# Patient Record
Sex: Male | Born: 1940 | Race: White | Hispanic: No | Marital: Married | Smoking: Former smoker
Health system: Southern US, Community
[De-identification: ages and names within clinical notes are randomized; demographics above are authoritative.]

## PROBLEM LIST (undated history)

## (undated) DIAGNOSIS — I10 Essential (primary) hypertension: Secondary | ICD-10-CM

## (undated) DIAGNOSIS — E785 Hyperlipidemia, unspecified: Secondary | ICD-10-CM

## (undated) DIAGNOSIS — D500 Iron deficiency anemia secondary to blood loss (chronic): Secondary | ICD-10-CM

## (undated) DIAGNOSIS — R972 Elevated prostate specific antigen [PSA]: Secondary | ICD-10-CM

## (undated) DIAGNOSIS — K635 Polyp of colon: Secondary | ICD-10-CM

## (undated) DIAGNOSIS — D72829 Elevated white blood cell count, unspecified: Secondary | ICD-10-CM

## (undated) DIAGNOSIS — D631 Anemia in chronic kidney disease: Secondary | ICD-10-CM

## (undated) DIAGNOSIS — D72823 Leukemoid reaction: Secondary | ICD-10-CM

## (undated) DIAGNOSIS — I6529 Occlusion and stenosis of unspecified carotid artery: Secondary | ICD-10-CM

## (undated) DIAGNOSIS — Z951 Presence of aortocoronary bypass graft: Secondary | ICD-10-CM

## (undated) DIAGNOSIS — R739 Hyperglycemia, unspecified: Secondary | ICD-10-CM

## (undated) DIAGNOSIS — N059 Unspecified nephritic syndrome with unspecified morphologic changes: Secondary | ICD-10-CM

## (undated) DIAGNOSIS — I739 Peripheral vascular disease, unspecified: Secondary | ICD-10-CM

## (undated) DIAGNOSIS — Z952 Presence of prosthetic heart valve: Secondary | ICD-10-CM

## (undated) HISTORY — DX: Anemia in chronic kidney disease: D63.1

## (undated) HISTORY — DX: Presence of aortocoronary bypass graft: Z95.1

## (undated) HISTORY — DX: Elevated prostate specific antigen (PSA): R97.20

## (undated) HISTORY — DX: Occlusion and stenosis of unspecified carotid artery: I65.29

## (undated) HISTORY — DX: Unspecified nephritic syndrome with unspecified morphologic changes: N05.9

## (undated) HISTORY — DX: Hyperglycemia, unspecified: R73.9

## (undated) HISTORY — DX: Elevated white blood cell count, unspecified: D72.829

## (undated) HISTORY — DX: Peripheral vascular disease, unspecified: I73.9

## (undated) HISTORY — DX: Leukemoid reaction: D72.823

## (undated) HISTORY — DX: Iron deficiency anemia secondary to blood loss (chronic): D50.0

## (undated) HISTORY — DX: Polyp of colon: K63.5

## (undated) HISTORY — DX: Hyperlipidemia, unspecified: E78.5

## (undated) HISTORY — DX: Essential (primary) hypertension: I10

---
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
## 2004-08-07 HISTORY — PX: BYPASS GRAFT: SHX909

## 2004-10-14 ENCOUNTER — Ambulatory Visit: Payer: Self-pay | Admitting: Gastroenterology

## 2004-10-24 ENCOUNTER — Ambulatory Visit: Payer: Self-pay | Admitting: Gastroenterology

## 2004-11-01 ENCOUNTER — Ambulatory Visit: Payer: Self-pay | Admitting: Internal Medicine

## 2004-11-08 ENCOUNTER — Ambulatory Visit: Payer: Self-pay

## 2004-11-14 ENCOUNTER — Ambulatory Visit (HOSPITAL_COMMUNITY): Admission: RE | Admit: 2004-11-14 | Discharge: 2004-11-14 | Payer: Self-pay | Admitting: Cardiovascular Disease

## 2004-11-23 ENCOUNTER — Ambulatory Visit
Admission: RE | Admit: 2004-11-23 | Discharge: 2004-11-23 | Payer: Self-pay | Admitting: Thoracic Surgery (Cardiothoracic Vascular Surgery)

## 2004-11-28 ENCOUNTER — Ambulatory Visit (HOSPITAL_COMMUNITY)
Admission: RE | Admit: 2004-11-28 | Discharge: 2004-11-28 | Payer: Self-pay | Admitting: Thoracic Surgery (Cardiothoracic Vascular Surgery)

## 2004-12-07 ENCOUNTER — Inpatient Hospital Stay (HOSPITAL_COMMUNITY)
Admission: RE | Admit: 2004-12-07 | Discharge: 2004-12-11 | Payer: Self-pay | Admitting: Thoracic Surgery (Cardiothoracic Vascular Surgery)

## 2004-12-07 DIAGNOSIS — Z951 Presence of aortocoronary bypass graft: Secondary | ICD-10-CM

## 2004-12-07 HISTORY — PX: CORONARY ARTERY BYPASS GRAFT: SHX141

## 2004-12-07 HISTORY — DX: Presence of aortocoronary bypass graft: Z95.1

## 2004-12-26 ENCOUNTER — Encounter
Admission: RE | Admit: 2004-12-26 | Discharge: 2004-12-26 | Payer: Self-pay | Admitting: Thoracic Surgery (Cardiothoracic Vascular Surgery)

## 2005-01-04 ENCOUNTER — Ambulatory Visit: Payer: Self-pay | Admitting: Internal Medicine

## 2005-01-04 ENCOUNTER — Encounter (HOSPITAL_COMMUNITY): Admission: RE | Admit: 2005-01-04 | Discharge: 2005-04-04 | Payer: Self-pay | Admitting: Cardiology

## 2005-02-02 ENCOUNTER — Ambulatory Visit: Payer: Self-pay | Admitting: Internal Medicine

## 2005-02-14 ENCOUNTER — Ambulatory Visit: Payer: Self-pay | Admitting: Internal Medicine

## 2005-03-06 ENCOUNTER — Ambulatory Visit: Payer: Self-pay | Admitting: Internal Medicine

## 2005-04-04 ENCOUNTER — Ambulatory Visit: Payer: Self-pay | Admitting: Internal Medicine

## 2005-04-11 ENCOUNTER — Ambulatory Visit: Payer: Self-pay | Admitting: Internal Medicine

## 2005-08-18 ENCOUNTER — Ambulatory Visit: Payer: Self-pay | Admitting: Internal Medicine

## 2005-09-01 ENCOUNTER — Ambulatory Visit: Payer: Self-pay

## 2005-09-14 ENCOUNTER — Ambulatory Visit: Payer: Self-pay | Admitting: Internal Medicine

## 2005-10-11 ENCOUNTER — Ambulatory Visit: Payer: Self-pay | Admitting: Internal Medicine

## 2005-10-13 ENCOUNTER — Ambulatory Visit: Payer: Self-pay | Admitting: Internal Medicine

## 2005-10-24 ENCOUNTER — Ambulatory Visit: Payer: Self-pay | Admitting: Cardiology

## 2005-11-17 ENCOUNTER — Ambulatory Visit: Payer: Self-pay | Admitting: Internal Medicine

## 2005-12-19 ENCOUNTER — Ambulatory Visit: Payer: Self-pay | Admitting: Internal Medicine

## 2005-12-26 ENCOUNTER — Ambulatory Visit: Payer: Self-pay | Admitting: Internal Medicine

## 2006-02-21 ENCOUNTER — Ambulatory Visit: Payer: Self-pay | Admitting: Internal Medicine

## 2006-04-03 ENCOUNTER — Ambulatory Visit: Payer: Self-pay | Admitting: Internal Medicine

## 2006-04-06 ENCOUNTER — Ambulatory Visit: Payer: Self-pay | Admitting: Cardiology

## 2006-05-01 ENCOUNTER — Ambulatory Visit: Payer: Self-pay

## 2006-06-25 ENCOUNTER — Ambulatory Visit: Payer: Self-pay | Admitting: Internal Medicine

## 2006-06-25 LAB — CONVERTED CEMR LAB
ALT: 27 units/L (ref 0–40)
AST: 25 units/L (ref 0–37)
Albumin: 4.2 g/dL (ref 3.5–5.2)
Alkaline Phosphatase: 92 units/L (ref 39–117)
BUN: 20 mg/dL (ref 6–23)
Bilirubin, Direct: 0.1 mg/dL (ref 0.0–0.3)
CO2: 28 meq/L (ref 19–32)
Calcium: 9.6 mg/dL (ref 8.4–10.5)
Chloride: 105 meq/L (ref 96–112)
Chol/HDL Ratio, serum: 3.9
Cholesterol: 118 mg/dL (ref 0–200)
Creatinine, Ser: 1.1 mg/dL (ref 0.4–1.5)
GFR calc non Af Amer: 71 mL/min
Glomerular Filtration Rate, Af Am: 86 mL/min/{1.73_m2}
Glucose, Bld: 118 mg/dL — ABNORMAL HIGH (ref 70–99)
HDL: 30.2 mg/dL — ABNORMAL LOW (ref >39.0)
LDL Cholesterol: 69 mg/dL (ref 0–99)
Potassium: 4.5 meq/L (ref 3.5–5.1)
Sodium: 142 meq/L (ref 135–145)
Total Bilirubin: 0.8 mg/dL (ref 0.3–1.2)
Total Protein: 6.6 g/dL (ref 6.0–8.3)
Triglyceride fasting, serum: 93 mg/dL (ref 0–149)
VLDL: 19 mg/dL (ref 0–40)

## 2006-07-05 ENCOUNTER — Ambulatory Visit: Payer: Self-pay | Admitting: Internal Medicine

## 2006-11-06 ENCOUNTER — Ambulatory Visit: Payer: Self-pay

## 2006-12-27 ENCOUNTER — Ambulatory Visit: Payer: Self-pay | Admitting: Internal Medicine

## 2006-12-27 LAB — CONVERTED CEMR LAB
ALT: 23 units/L (ref 0–40)
AST: 21 units/L (ref 0–37)
Albumin: 4.2 g/dL (ref 3.5–5.2)
Alkaline Phosphatase: 80 units/L (ref 39–117)
BUN: 16 mg/dL (ref 6–23)
Bilirubin, Direct: 0.1 mg/dL (ref 0.0–0.3)
CO2: 31 meq/L (ref 19–32)
Calcium: 9.8 mg/dL (ref 8.4–10.5)
Chloride: 109 meq/L (ref 96–112)
Cholesterol: 120 mg/dL (ref 0–200)
Creatinine, Ser: 1.1 mg/dL (ref 0.4–1.5)
GFR calc Af Amer: 86 mL/min
GFR calc non Af Amer: 71 mL/min
Glucose, Bld: 118 mg/dL — ABNORMAL HIGH (ref 70–99)
HDL: 31.1 mg/dL — ABNORMAL LOW (ref >39.0)
Hgb A1c MFr Bld: 5.8 % (ref 4.6–6.0)
LDL Cholesterol: 60 mg/dL (ref 0–99)
Potassium: 4.9 meq/L (ref 3.5–5.1)
Sodium: 146 meq/L — ABNORMAL HIGH (ref 135–145)
Total Bilirubin: 0.9 mg/dL (ref 0.3–1.2)
Total CHOL/HDL Ratio: 3.9
Total Protein: 7.5 g/dL (ref 6.0–8.3)
Triglycerides: 144 mg/dL (ref 0–149)
VLDL: 29 mg/dL (ref 0–40)

## 2007-01-04 ENCOUNTER — Ambulatory Visit: Payer: Self-pay | Admitting: Internal Medicine

## 2007-02-05 DIAGNOSIS — I10 Essential (primary) hypertension: Secondary | ICD-10-CM

## 2007-03-29 ENCOUNTER — Ambulatory Visit: Payer: Self-pay | Admitting: Internal Medicine

## 2007-03-29 LAB — CONVERTED CEMR LAB
BUN: 20 mg/dL (ref 6–23)
CO2: 29 meq/L (ref 19–32)
Calcium: 10.2 mg/dL (ref 8.4–10.5)
Chloride: 102 meq/L (ref 96–112)
Creatinine, Ser: 1.1 mg/dL (ref 0.4–1.5)
GFR calc Af Amer: 86 mL/min
GFR calc non Af Amer: 71 mL/min
Glucose, Bld: 165 mg/dL — ABNORMAL HIGH (ref 70–99)
Potassium: 5.2 meq/L — ABNORMAL HIGH (ref 3.5–5.1)
Sodium: 140 meq/L (ref 135–145)

## 2007-04-10 ENCOUNTER — Ambulatory Visit: Payer: Self-pay | Admitting: Internal Medicine

## 2007-04-10 DIAGNOSIS — R739 Hyperglycemia, unspecified: Secondary | ICD-10-CM

## 2007-05-17 ENCOUNTER — Ambulatory Visit: Payer: Self-pay

## 2007-05-21 ENCOUNTER — Telehealth: Payer: Self-pay | Admitting: Internal Medicine

## 2007-05-31 ENCOUNTER — Ambulatory Visit: Payer: Self-pay | Admitting: Cardiovascular Disease

## 2007-07-17 ENCOUNTER — Ambulatory Visit: Payer: Self-pay | Admitting: Internal Medicine

## 2007-07-17 LAB — CONVERTED CEMR LAB
ALT: 23 units/L (ref 0–53)
AST: 21 units/L (ref 0–37)
Albumin: 4.3 g/dL (ref 3.5–5.2)
Alkaline Phosphatase: 86 units/L (ref 39–117)
BUN: 14 mg/dL (ref 6–23)
Bilirubin, Direct: 0.2 mg/dL (ref 0.0–0.3)
CO2: 30 meq/L (ref 19–32)
Calcium: 9.8 mg/dL (ref 8.4–10.5)
Chloride: 101 meq/L (ref 96–112)
Cholesterol: 121 mg/dL (ref 0–200)
Creatinine, Ser: 1.1 mg/dL (ref 0.4–1.5)
GFR calc Af Amer: 86 mL/min
GFR calc non Af Amer: 71 mL/min
Glucose, Bld: 119 mg/dL — ABNORMAL HIGH (ref 70–99)
HDL: 26.2 mg/dL — ABNORMAL LOW (ref >39.0)
Hgb A1c MFr Bld: 5.8 % (ref 4.6–6.0)
LDL Cholesterol: 66 mg/dL (ref 0–99)
Potassium: 5 meq/L (ref 3.5–5.1)
Sodium: 140 meq/L (ref 135–145)
Total Bilirubin: 0.9 mg/dL (ref 0.3–1.2)
Total CHOL/HDL Ratio: 4.6
Total Protein: 6.8 g/dL (ref 6.0–8.3)
Triglycerides: 146 mg/dL (ref 0–149)
VLDL: 29 mg/dL (ref 0–40)

## 2007-07-24 ENCOUNTER — Ambulatory Visit: Payer: Self-pay | Admitting: Internal Medicine

## 2007-07-29 ENCOUNTER — Telehealth: Payer: Self-pay | Admitting: Internal Medicine

## 2007-09-12 ENCOUNTER — Telehealth: Payer: Self-pay | Admitting: Internal Medicine

## 2007-09-13 ENCOUNTER — Ambulatory Visit: Payer: Self-pay | Admitting: Internal Medicine

## 2007-11-18 ENCOUNTER — Ambulatory Visit: Payer: Self-pay | Admitting: Internal Medicine

## 2007-11-18 LAB — CONVERTED CEMR LAB
ALT: 24 units/L (ref 0–53)
Albumin: 4.3 g/dL (ref 3.5–5.2)
Bilirubin, Direct: 0.2 mg/dL (ref 0.0–0.3)
CO2: 30 meq/L (ref 19–32)
Calcium: 9.8 mg/dL (ref 8.4–10.5)
Chloride: 108 meq/L (ref 96–112)
Creatinine, Ser: 1.1 mg/dL (ref 0.4–1.5)
GFR calc Af Amer: 86 mL/min
LDL Cholesterol: 71 mg/dL (ref 0–99)
Sodium: 145 meq/L (ref 135–145)
Total Protein: 6.9 g/dL (ref 6.0–8.3)

## 2007-11-22 ENCOUNTER — Ambulatory Visit: Payer: Self-pay | Admitting: Internal Medicine

## 2007-11-22 DIAGNOSIS — E785 Hyperlipidemia, unspecified: Secondary | ICD-10-CM | POA: Insufficient documentation

## 2008-01-21 ENCOUNTER — Encounter: Payer: Self-pay | Admitting: Internal Medicine

## 2008-01-23 ENCOUNTER — Ambulatory Visit: Payer: Self-pay

## 2008-02-04 ENCOUNTER — Ambulatory Visit: Payer: Self-pay | Admitting: Internal Medicine

## 2008-03-26 ENCOUNTER — Ambulatory Visit: Payer: Self-pay | Admitting: Internal Medicine

## 2008-03-26 LAB — CONVERTED CEMR LAB
Albumin: 4.1 g/dL (ref 3.5–5.2)
Alkaline Phosphatase: 82 units/L (ref 39–117)
BUN: 21 mg/dL (ref 6–23)
CO2: 28 meq/L (ref 19–32)
Calcium: 9.5 mg/dL (ref 8.4–10.5)
Cholesterol: 102 mg/dL (ref 0–200)
Creatinine, Ser: 1.2 mg/dL (ref 0.4–1.5)
Glucose, Bld: 116 mg/dL — ABNORMAL HIGH (ref 70–99)
HDL: 23.1 mg/dL — ABNORMAL LOW (ref >39.0)
Hgb A1c MFr Bld: 6 % (ref 4.6–6.0)
Potassium: 4.6 meq/L (ref 3.5–5.1)
Sodium: 141 meq/L (ref 135–145)
Total Bilirubin: 0.8 mg/dL (ref 0.3–1.2)
Triglycerides: 83 mg/dL (ref 0–149)
VLDL: 17 mg/dL (ref 0–40)

## 2008-04-02 ENCOUNTER — Ambulatory Visit: Payer: Self-pay | Admitting: Internal Medicine

## 2008-05-14 ENCOUNTER — Ambulatory Visit: Payer: Self-pay | Admitting: Internal Medicine

## 2008-05-14 ENCOUNTER — Telehealth: Payer: Self-pay | Admitting: Internal Medicine

## 2008-06-09 ENCOUNTER — Ambulatory Visit: Payer: Self-pay | Admitting: Cardiovascular Disease

## 2008-07-16 ENCOUNTER — Ambulatory Visit: Payer: Self-pay | Admitting: Internal Medicine

## 2008-07-16 LAB — CONVERTED CEMR LAB
ALT: 22 units/L (ref 0–53)
AST: 21 units/L (ref 0–37)
Alkaline Phosphatase: 70 units/L (ref 39–117)
Bilirubin, Direct: 0.1 mg/dL (ref 0.0–0.3)
Calcium: 9.9 mg/dL (ref 8.4–10.5)
Cholesterol: 96 mg/dL (ref 0–200)
Creatinine, Ser: 1.1 mg/dL (ref 0.4–1.5)
GFR calc Af Amer: 86 mL/min
GFR calc non Af Amer: 71 mL/min
LDL Cholesterol: 54 mg/dL (ref 0–99)
Sodium: 144 meq/L (ref 135–145)
Total Bilirubin: 0.8 mg/dL (ref 0.3–1.2)
VLDL: 16 mg/dL (ref 0–40)

## 2008-07-23 ENCOUNTER — Ambulatory Visit: Payer: Self-pay | Admitting: Internal Medicine

## 2008-07-29 ENCOUNTER — Ambulatory Visit: Payer: Self-pay

## 2008-09-01 ENCOUNTER — Encounter: Payer: Self-pay | Admitting: Internal Medicine

## 2008-10-19 ENCOUNTER — Encounter: Payer: Self-pay | Admitting: Internal Medicine

## 2008-11-16 ENCOUNTER — Ambulatory Visit: Payer: Self-pay | Admitting: Internal Medicine

## 2008-11-16 LAB — CONVERTED CEMR LAB
AST: 22 units/L (ref 0–37)
Albumin: 4.2 g/dL (ref 3.5–5.2)
Alkaline Phosphatase: 75 units/L (ref 39–117)
Bilirubin, Direct: 0.1 mg/dL (ref 0.0–0.3)
CO2: 31 meq/L (ref 19–32)
Calcium: 9.7 mg/dL (ref 8.4–10.5)
Chloride: 107 meq/L (ref 96–112)
Creatinine, Ser: 1.1 mg/dL (ref 0.4–1.5)
Glucose, Bld: 120 mg/dL — ABNORMAL HIGH (ref 70–99)
HDL: 32.1 mg/dL — ABNORMAL LOW (ref >39.00)
Hgb A1c MFr Bld: 6.1 % (ref 4.6–6.5)
LDL Cholesterol: 60 mg/dL (ref 0–99)
Total Bilirubin: 0.9 mg/dL (ref 0.3–1.2)
Total CHOL/HDL Ratio: 3
VLDL: 16.4 mg/dL (ref 0.0–40.0)

## 2008-11-23 ENCOUNTER — Ambulatory Visit: Payer: Self-pay | Admitting: Internal Medicine

## 2009-03-22 ENCOUNTER — Ambulatory Visit: Payer: Self-pay | Admitting: Internal Medicine

## 2009-03-22 LAB — CONVERTED CEMR LAB
AST: 27 units/L (ref 0–37)
Albumin: 4.2 g/dL (ref 3.5–5.2)
Alkaline Phosphatase: 75 units/L (ref 39–117)
Chloride: 106 meq/L (ref 96–112)
GFR calc non Af Amer: 79.04 mL/min (ref >60)
Glucose, Bld: 118 mg/dL — ABNORMAL HIGH (ref 70–99)
Hgb A1c MFr Bld: 5.7 % (ref 4.6–6.5)
LDL Cholesterol: 56 mg/dL (ref 0–99)
Potassium: 4.7 meq/L (ref 3.5–5.1)
Total Protein: 7.1 g/dL (ref 6.0–8.3)
VLDL: 19.6 mg/dL (ref 0.0–40.0)

## 2009-03-29 ENCOUNTER — Ambulatory Visit: Payer: Self-pay | Admitting: Internal Medicine

## 2009-04-16 ENCOUNTER — Encounter: Payer: Self-pay | Admitting: Internal Medicine

## 2009-04-19 ENCOUNTER — Ambulatory Visit: Payer: Self-pay | Admitting: Internal Medicine

## 2009-05-17 ENCOUNTER — Ambulatory Visit: Payer: Self-pay | Admitting: Internal Medicine

## 2009-07-26 DIAGNOSIS — I739 Peripheral vascular disease, unspecified: Secondary | ICD-10-CM

## 2009-07-26 DIAGNOSIS — I25810 Atherosclerosis of coronary artery bypass graft(s) without angina pectoris: Secondary | ICD-10-CM

## 2009-07-26 DIAGNOSIS — I6529 Occlusion and stenosis of unspecified carotid artery: Secondary | ICD-10-CM | POA: Insufficient documentation

## 2009-07-26 DIAGNOSIS — F17200 Nicotine dependence, unspecified, uncomplicated: Secondary | ICD-10-CM

## 2009-07-28 ENCOUNTER — Encounter: Payer: Self-pay | Admitting: Cardiovascular Disease

## 2009-07-29 ENCOUNTER — Ambulatory Visit: Payer: Self-pay

## 2009-07-29 ENCOUNTER — Encounter: Payer: Self-pay | Admitting: Cardiovascular Disease

## 2009-08-02 ENCOUNTER — Telehealth: Payer: Self-pay | Admitting: Internal Medicine

## 2009-08-09 ENCOUNTER — Ambulatory Visit: Payer: Self-pay | Admitting: Cardiovascular Disease

## 2009-09-10 ENCOUNTER — Ambulatory Visit: Payer: Self-pay | Admitting: Internal Medicine

## 2009-09-10 LAB — CONVERTED CEMR LAB
Albumin: 4.2 g/dL (ref 3.5–5.2)
BUN: 21 mg/dL (ref 6–23)
Bilirubin, Direct: 0.2 mg/dL (ref 0.0–0.3)
CO2: 31 meq/L (ref 19–32)
Creatinine, Ser: 1.2 mg/dL (ref 0.4–1.5)
Glucose, Bld: 122 mg/dL — ABNORMAL HIGH (ref 70–99)
HDL: 38.7 mg/dL — ABNORMAL LOW (ref >39.00)
LDL Cholesterol: 47 mg/dL (ref 0–99)
Potassium: 5 meq/L (ref 3.5–5.1)
Total CHOL/HDL Ratio: 3
VLDL: 16.8 mg/dL (ref 0.0–40.0)

## 2009-09-14 ENCOUNTER — Encounter (INDEPENDENT_AMBULATORY_CARE_PROVIDER_SITE_OTHER): Payer: Self-pay | Admitting: *Deleted

## 2009-09-17 ENCOUNTER — Ambulatory Visit: Payer: Self-pay | Admitting: Internal Medicine

## 2009-11-09 ENCOUNTER — Encounter (INDEPENDENT_AMBULATORY_CARE_PROVIDER_SITE_OTHER): Payer: Self-pay

## 2009-11-11 ENCOUNTER — Telehealth: Payer: Self-pay | Admitting: Internal Medicine

## 2009-11-11 ENCOUNTER — Ambulatory Visit: Payer: Self-pay | Admitting: Gastroenterology

## 2009-11-22 ENCOUNTER — Telehealth: Payer: Self-pay | Admitting: Internal Medicine

## 2009-11-24 ENCOUNTER — Ambulatory Visit: Payer: Self-pay | Admitting: Gastroenterology

## 2009-11-24 DIAGNOSIS — K635 Polyp of colon: Secondary | ICD-10-CM

## 2009-11-24 HISTORY — DX: Polyp of colon: K63.5

## 2009-11-24 LAB — HM COLONOSCOPY

## 2009-11-29 ENCOUNTER — Encounter: Payer: Self-pay | Admitting: Gastroenterology

## 2010-03-11 ENCOUNTER — Ambulatory Visit: Payer: Self-pay | Admitting: Internal Medicine

## 2010-03-11 LAB — CONVERTED CEMR LAB
BUN: 19 mg/dL (ref 6–23)
CO2: 31 meq/L (ref 19–32)
Calcium: 9.8 mg/dL (ref 8.4–10.5)
Cholesterol: 117 mg/dL (ref 0–200)
Creatinine, Ser: 1 mg/dL (ref 0.4–1.5)
HDL: 35.6 mg/dL — ABNORMAL LOW (ref >39.00)
Potassium: 5.1 meq/L (ref 3.5–5.1)
Sodium: 145 meq/L (ref 135–145)
Triglycerides: 96 mg/dL (ref 0.0–149.0)

## 2010-03-18 ENCOUNTER — Ambulatory Visit: Payer: Self-pay | Admitting: Internal Medicine

## 2010-05-10 ENCOUNTER — Telehealth: Payer: Self-pay | Admitting: Internal Medicine

## 2010-08-15 ENCOUNTER — Telehealth: Payer: Self-pay | Admitting: Internal Medicine

## 2010-08-16 ENCOUNTER — Encounter: Payer: Self-pay | Admitting: Cardiovascular Disease

## 2010-08-16 ENCOUNTER — Ambulatory Visit
Admission: RE | Admit: 2010-08-16 | Discharge: 2010-08-16 | Payer: Self-pay | Source: Home / Self Care | Attending: Cardiovascular Disease | Admitting: Cardiovascular Disease

## 2010-09-06 NOTE — Letter (Signed)
Summary: Patient Notice- Polyp Results  [facility]  [address] [place], [place] [address]   Phone: [phone]  Fax: [phone]        November 29, 2009 MRN: [id number]    Brett [name] [address] [place], [place]  [address]    Dear Mr. Harder,  I am pleased to inform you that the colon polyp(s) removed during your recent colonoscopy was (were) found to be benign (no cancer detected) upon pathologic examination.  I recommend you have a repeat colonoscopy examination in 5_ years to look for recurrent polyps, as having colon polyps increases your risk for having recurrent polyps or even colon cancer in the future.  Should you develop new or worsening symptoms of abdominal pain, bowel habit changes or bleeding from the rectum or bowels, please schedule an evaluation with either your primary care physician or with me.  Additional information/recommendations:  xx__ No further action with gastroenterology is needed at this time. Please      follow-up with your primary care physician for your other healthcare      needs.  __ Please call [phone] to schedule a return visit to review your      situation.  __ Please keep your follow-up visit as already scheduled.  __ Continue treatment plan as outlined the day of your exam.  Please call us if you are having persistent problems or have questions about your condition that have not been fully answered at this time.  Sincerely,  Mardella Layman MD [facility]  This letter has been electronically signed by your physician.  Appended Document: Patient Notice- Polyp Results letter mailed 4.26.11

## 2010-09-06 NOTE — Assessment & Plan Note (Signed)
Summary: 6 MNTH ROV//SLM   Vital Signs:  Patient profile:   70 year old male Weight:      172 pounds Temp:     98 degrees F Pulse rate:   76 / minute Resp:     12 per minute BP sitting:   146 / 78  (left arm)  Vitals Entered By: Gladis Riffle, RN (September 17, 2009 9:06 AM)  Serial Vital Signs/Assessments:  Time      Position  BP       Pulse  Resp  Temp     By                     135/70                         [name] MD  CC: 6 month rov, labs done Is Patient Diabetic? No   Primary Care Provider:  [name] MD  CC:  6 month rov and labs done.  History of Present Illness:  Follow-Up Visit      This is a 70 year old man who presents for Follow-up visit.  The patient denies chest pain and palpitations.  Since the last visit the patient notes no new problems or concerns.  The patient reports taking meds as prescribed.  When questioned about possible medication side effects, the patient notes none.   All other systems reviewed and were negative  reviewed CV note  Preventive Screening-Counseling & Management  Alcohol-Tobacco     Smoking Status: quit  Medications Prior to Update: 1)  Avapro 300 Mg Tabs (Irbesartan) .... Take 1 Tablet By Mouth Once A Day 2)  Lipitor 80 Mg Tabs (Atorvastatin Calcium) .... Take 1 Tablet By Mouth Once A Day 3)  Metoprolol Succinate 50 Mg Tb24 (Metoprolol Succinate) .... Take 1 Tablet By Mouth Once A Day 4)  Aspirin Ec 325 Mg Tbec (Aspirin) .... Once Daily 5)  Diasense Multivitamin   Tabs (Multiple Vitamins-Minerals) .... One Daily 6)  Hydrochlorothiazide 12.5 Mg  Tabs (Hydrochlorothiazide) .... One By Mouth Daily 7)  Vitamin C 500 Mg  Tabs (Ascorbic Acid) .... Once Daily 8)  Caltrate 600+d 600-400 Mg-Unit  Tabs (Calcium Carbonate-Vitamin D) .... Once Daily 9)  Niaspan 1000 Mg Cr-Tabs (Niacin (Antihyperlipidemic)) .... Take 1 Tablet By Mouth Once A Day 10)  Cilostazol 100 Mg  Tabs (Cilostazol) .... Take 1 Tablet By Mouth Two Times A Day 11)   Nitroglycerin 0.3 Mg Subl (Nitroglycerin) .... One Under Tongue As Needed Chest Pain 12)  Niacin Cr 500 Mg Cr-Caps (Niacin) .... 2 By Mouth Daily  Allergies (verified): No Known Drug Allergies  Physical Exam  General:  Well-developed,well-nourished,in no acute distress; alert,appropriate and cooperative throughout examination Head:  normocephalic and atraumatic.   Eyes:  pupils equal and pupils round.   Ears:  R ear normal and L ear normal.   Neck:  No deformities, masses, or tenderness noted. Chest Wall:  No deformities, masses, tenderness or gynecomastia noted. Lungs:  Normal respiratory effort, chest expands symmetrically. Lungs are clear to auscultation, no crackles or wheezes. Heart:  normal rate and regular rhythm.  1/6 sem Abdomen:  soft, non-tender, and normal bowel sounds.   Msk:  normal ROM and no joint tenderness.   Neurologic:  cranial nerves II-XII intact and gait normal.   Skin:  turgor normal and color normal.   Psych:  normally interactive and good eye contact.     Impression &  Recommendations:  Problem # 1:  HYPERTENSION (ICD-401.9) reasonable control---rechecked His updated medication list for this problem includes:    Avapro 300 Mg Tabs (Irbesartan) .Marland Kitchen... Take 1 tablet by mouth once a day    Metoprolol Succinate 50 Mg Tb24 (Metoprolol succinate) .Marland Kitchen... Take 1 tablet by mouth once a day    Hydrochlorothiazide 12.5 Mg Tabs (Hydrochlorothiazide) ..... One by mouth daily  BP today: 146/78 Prior BP: 123/63 (08/09/2009)  Labs Reviewed: K+: 5.0 (09/10/2009) Creat: : 1.2 (09/10/2009)   Chol: 102 (09/10/2009)   HDL: 38.70 (09/10/2009)   LDL: 47 (09/10/2009)   TG: 84.0 (09/10/2009)  Problem # 2:  HYPERLIPIDEMIA (ICD-272.4) well controlled continue current medications  His updated medication list for this problem includes:    Lipitor 80 Mg Tabs (Atorvastatin calcium) .Marland Kitchen... Take 1 tablet by mouth once a day    Niaspan 1000 Mg Cr-tabs (Niacin (antihyperlipidemic))  .Marland Kitchen... Take 1 tablet by mouth once a day    Niacin Cr 500 Mg Cr-caps (Niacin) .Marland Kitchen... 2 by mouth daily  Labs Reviewed: SGOT: 22 (09/10/2009)   SGPT: 24 (09/10/2009)   HDL:38.70 (09/10/2009), 31.80 (03/22/2009)  LDL:47 (09/10/2009), 56 (03/22/2009)  Chol:102 (09/10/2009), 107 (03/22/2009)  Trig:84.0 (09/10/2009), 98.0 (03/22/2009)  Problem # 3:  CAROTID STENOSIS (ICD-433.10) no sxs His updated medication list for this problem includes:    Aspirin Ec 325 Mg Tbec (Aspirin) ..... Once daily    Cilostazol 100 Mg Tabs (Cilostazol) .Marland Kitchen... Take 1 tablet by mouth two times a day  Carotid Duplex Scan: Impressions:  Stable bilateral carotid disease. 0-39% RICA stenosis. 60-79% LICA stenosis, low end of range.  f/u 1 year.   Charlton Haws, MD. (07/29/2008)  Complete Medication List: 1)  Avapro 300 Mg Tabs (Irbesartan) .... Take 1 tablet by mouth once a day 2)  Lipitor 80 Mg Tabs (Atorvastatin calcium) .... Take 1 tablet by mouth once a day 3)  Metoprolol Succinate 50 Mg Tb24 (Metoprolol succinate) .... Take 1 tablet by mouth once a day 4)  Aspirin Ec 325 Mg Tbec (Aspirin) .... Once daily 5)  Diasense Multivitamin Tabs (Multiple vitamins-minerals) .... One daily 6)  Hydrochlorothiazide 12.5 Mg Tabs (Hydrochlorothiazide) .... One by mouth daily 7)  Vitamin C 500 Mg Tabs (Ascorbic acid) .... Once daily 8)  Caltrate 600+d 600-400 Mg-unit Tabs (Calcium carbonate-vitamin d) .... Once daily 9)  Niaspan 1000 Mg Cr-tabs (Niacin (antihyperlipidemic)) .... Take 1 tablet by mouth once a day 10)  Cilostazol 100 Mg Tabs (Cilostazol) .... Take 1 tablet by mouth two times a day 11)  Nitroglycerin 0.3 Mg Subl (Nitroglycerin) .... One under tongue as needed chest pain 12)  Niacin Cr 500 Mg Cr-caps (Niacin) .... 2 by mouth daily  Patient Instructions: 1)  Please schedule a follow-up appointment in 6 months. 2)  lipids 272.4 3)  liver 995.2  4)  bmet 5)  A!C

## 2010-09-06 NOTE — Assessment & Plan Note (Signed)
Summary: YEARL/SL  Medications Added NIACIN CR 500 MG CR-CAPS (NIACIN) 2 by mouth daily      Allergies Added: NKDA  Visit Type:  Follow-up Primary Provider:  [name] MD  CC:  none.  History of Present Illness: This is a 70 year old gentleman with coronary and peripheral arterial disease, presenting today for followup evaluation. The patient underwent coronary bypass surgery in 2006. 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  The patient is doing well at present. He has responded well to cilostazol and denies exertional leg pain at present. He does yard maintenance, but otherwise is very active. He specifically denies chest pain, leg pain, dyspnea, orthopnea, edema, or PND.  Current Medications (verified): 1)  Avapro 300 Mg Tabs (Irbesartan) .... Take 1 Tablet By Mouth Once A Day 2)  Lipitor 80 Mg Tabs (Atorvastatin Calcium) .... Take 1 Tablet By Mouth Once A Day 3)  Metoprolol Succinate 50 Mg Tb24 (Metoprolol Succinate) .... Take 1 Tablet By Mouth Once A Day 4)  Aspirin Ec 325 Mg Tbec (Aspirin) .... Once Daily 5)  Diasense Multivitamin   Tabs (Multiple Vitamins-Minerals) .... One Daily 6)  Hydrochlorothiazide 12.5 Mg  Tabs (Hydrochlorothiazide) .... One By Mouth Daily 7)  Vitamin C 500 Mg  Tabs (Ascorbic Acid) .... Once Daily 8)  Caltrate 600+d 600-400 Mg-Unit  Tabs (Calcium Carbonate-Vitamin D) .... Once Daily 9)  Niaspan 1000 Mg Cr-Tabs (Niacin (Antihyperlipidemic)) .... Take 1 Tablet By Mouth Once A Day 10)  Cilostazol 100 Mg  Tabs (Cilostazol) .... Take 1 Tablet By Mouth Two Times A Day 11)  Nitroglycerin 0.3 Mg Subl (Nitroglycerin) .... One Under Tongue As Needed Chest Pain 12)  Niacin Cr 500 Mg Cr-Caps (Niacin) .... 2 By Mouth Daily  Allergies (verified): No Known Drug Allergies  Past History:  Past medical history reviewed for relevance to current acute and chronic problems.  Past Medical History: Reviewed history from  07/26/2009 and no changes required. 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) HYPERTENSION (ICD-401.9) HYPERLIPIDEMIA (ICD-272.4) PERIPHERAL VASCULAR DISEASE (ICD-443.9) CAROTID STENOSIS (ICD-433.10) UNSPECIFIED GENERAL MEDICAL EXAMINATION (ICD-V70.9) HYPERGLYCEMIA (ICD-790.29) TOBACCO ABUSE (ICD-305.1)  Review of Systems       Negative except as per HPI   Vital Signs:  Patient profile:   70 year old male Height:      66 inches Weight:      172 pounds BMI:     27.86 Pulse rate:   85 / minute Resp:     16 per minute BP sitting:   123 / 63  (right arm)  Vitals Entered By: Marrion Coy, CNA (August 09, 2009 10:24 AM)  Physical Exam  General:  Pt is alert and oriented, in no acute distress. HEENT: normal Neck: normal carotid upstrokes without bruits, JVP normal Lungs: CTA CV: RRR without murmur or gallop Abd: soft, NT, positive BS, no bruit, no organomegaly Ext: no clubbing, cyanosis, or edema. peripheral pulses 2+ and equal Skin: warm and dry without rash    EKG  Procedure date:  08/09/2009  Findings:      Normal sinus rhythm, within normal limits. Heart rate 85 beats per minute.  Impression & Recommendations:  Problem # 1:  CAD, ARTERY BYPASS GRAFT (ICD-414.04) The patient is stable without angina. We'll continue his current medical therapy. Recommend followup in one year. His updated medication list for this problem includes:    Metoprolol Succinate 50 Mg Tb24 (Metoprolol succinate) .Marland Kitchen... Take 1 tablet by mouth once a  day    Aspirin Ec 325 Mg Tbec (Aspirin) ..... Once daily    Cilostazol 100 Mg Tabs (Cilostazol) .Marland Kitchen... Take 1 tablet by mouth two times a day    Nitroglycerin 0.3 Mg Subl (Nitroglycerin) ..... One under tongue as needed chest pain  Orders: EKG w/ Interpretation (93000)  Problem # 2:  PERIPHERAL VASCULAR DISEASE (ICD-443.9) the patient's claudication symptoms have resolved with the use of cilostazol. Encouraged him to try to  increase his walking program.  Will check ABIs prior to his visit next year.  Problem # 3:  HYPERTENSION (ICD-401.9) stable. His updated medication list for this problem includes:    Avapro 300 Mg Tabs (Irbesartan) .Marland Kitchen... Take 1 tablet by mouth once a day    Metoprolol Succinate 50 Mg Tb24 (Metoprolol succinate) .Marland Kitchen... Take 1 tablet by mouth once a day    Aspirin Ec 325 Mg Tbec (Aspirin) ..... Once daily    Hydrochlorothiazide 12.5 Mg Tabs (Hydrochlorothiazide) ..... One by mouth daily  BP today: 123/63 Prior BP: 104/60 (03/29/2009)  Labs Reviewed: K+: 4.7 (03/22/2009) Creat: : 1.0 (03/22/2009)   Chol: 107 (03/22/2009)   HDL: 31.80 (03/22/2009)   LDL: 56 (03/22/2009)   TG: 98.0 (03/22/2009)  Patient Instructions: 1)  Your physician recommends that you schedule a follow-up appointment in: 12 months . you wil receive a reminder 2 months prior appointment date. 2)  Carotid dopplet and ABI's same day next year on 2012.

## 2010-09-06 NOTE — Letter (Signed)
Summary: Colonoscopy Letter  [facility]  [address] [place], [place] [address]   Phone: [phone]  Fax: [phone]      September 14, 2009 MRN: [id number]   Brett [name] [address] [place], [place]  [address]   Dear [name],   According to your medical record, it is time for you to schedule a Colonoscopy. The American Cancer Society recommends this procedure as a method to detect early colon cancer. Patients with a family history of colon cancer, or a personal history of colon polyps or inflammatory bowel disease are at increased risk.  This letter has beeen generated based on the recommendations made at the time of your procedure. If you feel that in your particular situation this may no longer apply, please contact our office.  Please call our office at [phone] to schedule this appointment or to update your records at your earliest convenience.  Thank you for cooperating with [place] to provide you with the very best care possible.   Sincerely,  Vania Rea. Jarold Motto, M.D.  [facility] [phone]

## 2010-09-06 NOTE — Progress Notes (Signed)
Summary: lipitor refill  Phone Note Call from Patient Call back at Home Phone [phone]   Caller: Patient Call For: [name] MD Summary of Call: lipitor 80 mg with 3 refills fax to [facility] Initial call taken by: Heron Sabins,  November 22, 2009 12:15 PM  Follow-up for Phone Call        see Rx.  Left message on home phone to notify pt. Follow-up by: Gladis Riffle, RN,  November 22, 2009 2:29 PM    Prescriptions: LIPITOR 80 MG TABS (ATORVASTATIN CALCIUM) Take 1 tablet by mouth once a day  #90 x 3   Entered by:   Gladis Riffle, RN   Authorized by:   [name] MD   Signed by:   Gladis Riffle, RN on 11/22/2009   Method used:   Faxed to ...       [facility] (mail[facility])       [address] [place], [place]  [address]       Ph: [phone]       Fax: [phone]   RxID:   [id number]

## 2010-09-06 NOTE — Miscellaneous (Signed)
Summary: Lec previsit  Clinical Lists Changes  Medications: Added new medication of MOVIPREP 100 GM  SOLR (PEG-KCL-NACL-NASULF-NA ASC-C) As per prep instructions. - Signed Rx of MOVIPREP 100 GM  SOLR (PEG-KCL-NACL-NASULF-NA ASC-C) As per prep instructions.;  #1 x 0;  Signed;  Entered by: Ulis Rias RN;  Authorized by: Mardella Layman MD [facility];  Method used: Electronically to [facility] [phone]*, [address], [place], [place]  [address], Ph: [phone] or [phone], Fax: [phone] Observations: Added new observation of NKA: T (11/11/2009 9:04)    Prescriptions: MOVIPREP 100 GM  SOLR (PEG-KCL-NACL-NASULF-NA ASC-C) As per prep instructions.  #1 x 0   Entered by:   Ulis Rias RN   Authorized by:   Mardella Layman MD [facility]   Signed by:   Ulis Rias RN on 11/11/2009   Method used:   Electronically to        [facility] [phone]* (retail)       [address]       [place], [place]  [address]       Ph: [phone] or [phone]       Fax: [phone]   RxID:   [id number]   Appended Document: Lec previsit Pt came in for a previsit today prior to his colonoscopy on 11/24/09 with Dr Jarold Motto. He is on Pletal daily and had a CABG-5 vessel in 2006 for a blockage. Will send messge to Dr Jarold Motto and his nurse regarding this medication and if he should stay on it.  Appended Document: Lec previsit ask cardiology MD  Appended Document: Lec previsit Called Dr Cato Mulligan office and left message for nurse regarding pt being on Pletal and is scheduled for a colonoscopy with Dr Jarold Motto on 11/24/09. We  need to know if the pt needs to stay on the Pletal or stop it prior to the colonscopy.They wiill have Dr Cato Mulligan nurse call us back.  Appended Document: Lec previsit Alvino Chapel from Dr. Cato Mulligan office called.  It is OK for pt to stop pletal 5 days prior to proc and restart it the day after.      Appended Document: Lec previsit Spoke with pt and informed him to stop his Pletal 5 days prior to  the colonoscopy per Dr Cato Mulligan office and to restart it the next day past the procedure. Pt states he understood and he will call back if he has further questions.

## 2010-09-06 NOTE — Assessment & Plan Note (Signed)
Summary: 6 month rov/njr   Vital Signs:  Patient profile:   70 year old male Weight:      167 pounds Temp:     98.0 degrees F oral Pulse rate:   66 / minute BP sitting:   120 / 80  (right arm) Cuff size:   regular  Vitals Entered By: Romualdo Bolk, CMA (AAMA) (March 18, 2010 7:55 AM) CC: Follow-up visit on labs   Primary Care Provider:  [name] MD  CC:  Follow-up visit on labs.  History of Present Illness:  Follow-Up Visit      This is a 70 year old man who presents for Follow-up visit.  The patient denies chest pain and palpitations.  Since the last visit the patient notes no new problems or concerns.  The patient reports taking meds as prescribed and monitoring blood sugars.  When questioned about possible medication side effects, the patient notes none.    All other systems reviewed and were negative   Preventive Screening-Counseling & Management  Alcohol-Tobacco     Smoking Status: quit  Current Problems (verified): 1)  Cad, Artery Bypass Graft  (ICD-414.04) 2)  Hypertension  (ICD-401.9) 3)  Hyperlipidemia  (ICD-272.4) 4)  Peripheral Vascular Disease  (ICD-443.9) 5)  Carotid Stenosis  (ICD-433.10) 6)  Unspecified General Medical Examination  (ICD-V70.9) 7)  Hyperglycemia  (ICD-790.29) 8)  Tobacco Abuse  (ICD-305.1)  Current Medications (verified): 1)  Avapro 300 Mg Tabs (Irbesartan) .... Take 1 Tablet By Mouth Once A Day 2)  Lipitor 80 Mg Tabs (Atorvastatin Calcium) .... Take 1 Tablet By Mouth Once A Day 3)  Metoprolol Succinate 50 Mg Tb24 (Metoprolol Succinate) .... Take 1 Tablet By Mouth Once A Day 4)  Aspirin Ec 325 Mg Tbec (Aspirin) .... Once Daily 5)  Diasense Multivitamin   Tabs (Multiple Vitamins-Minerals) .... One Daily 6)  Hydrochlorothiazide 12.5 Mg  Tabs (Hydrochlorothiazide) .... One By Mouth Daily 7)  Vitamin C 500 Mg  Tabs (Ascorbic Acid) .... Once Daily 8)  Caltrate 600+d 600-400 Mg-Unit  Tabs (Calcium Carbonate-Vitamin D) .... Once  Daily 9)  Niaspan 1000 Mg Cr-Tabs (Niacin (Antihyperlipidemic)) .... Take 1 Tablet By Mouth Once A Day 10)  Cilostazol 100 Mg  Tabs (Cilostazol) .... Take 1 Tablet By Mouth Two Times A Day 11)  Nitroglycerin 0.3 Mg Subl (Nitroglycerin) .... One Under Tongue As Needed Chest Pain 12)  Niacin Cr 500 Mg Cr-Caps (Niacin) .... 2 By Mouth Daily  Allergies (verified): No Known Drug Allergies  Past History:  Past Medical History: Last updated: 23-Aug-2009 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) HYPERTENSION (ICD-401.9) HYPERLIPIDEMIA (ICD-272.4) PERIPHERAL VASCULAR DISEASE (ICD-443.9) CAROTID STENOSIS (ICD-433.10) UNSPECIFIED GENERAL MEDICAL EXAMINATION (ICD-V70.9) HYPERGLYCEMIA (ICD-790.29) TOBACCO ABUSE (ICD-305.1)  Past Surgical History: Last updated: 08/23/09 Coronary artery bypass graft-2006 Colonoscopy-09/09/2003  Family History: Last updated: 08/23/09  The patient's father died of myocardial infarction at age  70. The patient's paternal grandfather also died of myocardial infarction at a young age.  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 He is married and lives with his wife with their 85 year old daughter. The patient continues to smoke one pack of cigarettes per day and has smoked for more than 40 years. At times he smoked quite heavily. The patient denies history of significant alcohol consumption.  Risk Factors: Smoking Status:  quit (03/18/2010)  Physical Exam  General:  alert and well-developed.   Head:  normocephalic and atraumatic.   Eyes:  pupils equal and pupils round.   Ears:  R ear normal and L ear normal.   Neck:  No deformities, masses, or tenderness noted. Chest Wall:  No deformities, masses, tenderness or gynecomastia noted. Lungs:  normal  respiratory effort and no intercostal retractions.   Heart:  normal rate and regular rhythm.   Abdomen:  soft and non-tender.   Skin:  turgor normal and color normal.   Psych:  normally interactive and good eye contact.     Impression & Recommendations:  Problem # 1:  HYPERTENSION (ICD-401.9) continue current medications  His updated medication list for this problem includes:    Avapro 300 Mg Tabs (Irbesartan) .Marland Kitchen... Take 1 tablet by mouth once a day    Metoprolol Succinate 50 Mg Tb24 (Metoprolol succinate) .Marland Kitchen... Take 1 tablet by mouth once a day    Hydrochlorothiazide 12.5 Mg Tabs (Hydrochlorothiazide) ..... One by mouth daily  BP today: 120/80 Prior BP: 146/78 (09/17/2009)  Labs Reviewed: K+: 5.1 (03/11/2010) Creat: : 1.0 (03/11/2010)   Chol: 117 (03/11/2010)   HDL: 35.60 (03/11/2010)   LDL: 62 (03/11/2010)   TG: 96.0 (03/11/2010)  Problem # 2:  HYPERLIPIDEMIA (ICD-272.4) controlled continue current medications  His updated medication list for this problem includes:    Lipitor 80 Mg Tabs (Atorvastatin calcium) .Marland Kitchen... Take 1 tablet by mouth once a day    Niaspan 1000 Mg Cr-tabs (Niacin (antihyperlipidemic)) .Marland Kitchen... Take 1 tablet by mouth once a day    Niacin Cr 500 Mg Cr-caps (Niacin) .Marland Kitchen... 2 by mouth daily  Labs Reviewed: SGOT: 28 (03/11/2010)   SGPT: 26 (03/11/2010)   HDL:35.60 (03/11/2010), 38.70 (09/10/2009)  LDL:62 (03/11/2010), 47 (09/10/2009)  Chol:117 (03/11/2010), 102 (09/10/2009)  Trig:96.0 (03/11/2010), 84.0 (09/10/2009)  Problem # 3:  CAD, ARTERY BYPASS GRAFT (ICD-414.04)  His updated medication list for this problem includes:    Avapro 300 Mg Tabs (Irbesartan) .Marland Kitchen... Take 1 tablet by mouth once a day    Metoprolol Succinate 50 Mg Tb24 (Metoprolol succinate) .Marland Kitchen... Take 1 tablet by mouth once a day    Aspirin Ec 325 Mg Tbec (Aspirin) ..... Once daily    Hydrochlorothiazide 12.5 Mg Tabs (Hydrochlorothiazide) ..... One by mouth daily    Cilostazol 100 Mg Tabs  (Cilostazol) .Marland Kitchen... Take 1 tablet by mouth two times a day    Nitroglycerin 0.3 Mg Subl (Nitroglycerin) ..... One under tongue as needed chest pain  Labs Reviewed: Chol: 117 (03/11/2010)   HDL: 35.60 (03/11/2010)   LDL: 62 (03/11/2010)   TG: 96.0 (03/11/2010)  Complete Medication List: 1)  Avapro 300 Mg Tabs (Irbesartan) .... Take 1 tablet by mouth once a day 2)  Lipitor 80 Mg Tabs (Atorvastatin calcium) .... Take 1 tablet by mouth once a day 3)  Metoprolol Succinate 50 Mg Tb24 (Metoprolol succinate) .... Take 1 tablet by mouth once a day 4)  Aspirin Ec 325 Mg Tbec (Aspirin) .... Once daily 5)  Diasense Multivitamin Tabs (Multiple vitamins-minerals) .... One daily 6)  Hydrochlorothiazide 12.5 Mg Tabs (Hydrochlorothiazide) .... One by mouth daily 7)  Vitamin C 500 Mg Tabs (Ascorbic acid) .... Once daily 8)  Caltrate 600+d 600-400 Mg-unit Tabs (Calcium carbonate-vitamin d) .... Once daily 9)  Niaspan 1000 Mg Cr-tabs (Niacin (antihyperlipidemic)) .... Take 1 tablet by mouth once a day 10)  Cilostazol 100 Mg Tabs (Cilostazol) .... Take 1 tablet by mouth two times a  day 11)  Nitroglycerin 0.3 Mg Subl (Nitroglycerin) .... One under tongue as needed chest pain 12)  Niacin Cr 500 Mg Cr-caps (Niacin) .... 2 by mouth daily

## 2010-09-06 NOTE — Progress Notes (Signed)
Summary: REFILL REQUEST ([facility])  Phone Note Refill Request Message from:  Patient on May 10, 2010 2:32 PM  Refills Requested: Medication #1:  HYDROCHLOROTHIAZIDE 12.5 MG  TABS one by mouth daily   Notes: [facility] [employer] [facility]) - 90 day supply.  Medication #2:  METOPROLOL SUCCINATE 50 MG TB24 Take 1 tablet by mouth once a day   Notes: [facility]) - 90 day supply.  Medication #3:  NIASPAN 1000 MG CR-TABS Take 1 tablet by mouth once a day   Notes: [facility]) - 90 day supply...Marland KitchenMarland KitchenLooks like this med (Niaspan) may have been sent in already.    Initial call taken by: Debbra Riding,  May 10, 2010 2:32 PM    Prescriptions: HYDROCHLOROTHIAZIDE 12.5 MG  TABS (HYDROCHLOROTHIAZIDE) one by mouth daily  #90 x 3   Entered by:   Sid Falcon LPN   Authorized by:   [name] MD   Signed by:   Sid Falcon LPN on 08/65/7846   Method used:   Faxed to ...       [facility] (mail[facility]order)       [address] [place], [place]  [address]       Ph: [phone]       Fax: [phone]   RxID:   [id number] METOPROLOL SUCCINATE 50 MG TB24 (METOPROLOL SUCCINATE) Take 1 tablet by mouth once a day  #90 x 3   Entered by:   Sid Falcon LPN   Authorized by:   [name] MD   Signed by:   Sid Falcon LPN on 33/29/5188   Method used:   Faxed to ...       [facility] (mail-order)       [address] [place], [place]  [address]       Ph: [phone]       Fax: [phone]   RxID:   [id number]

## 2010-09-06 NOTE — Letter (Signed)
Summary: [facility] Instructions  [facility]  [address] [place], [place] [address]   Phone: [phone]  Fax: [phone]       WEYMAN BOGDON    [birth year]    MRN: [id number]        Procedure Day Dorna Bloom:  Wednesday 11/24/2009     Arrival Time: 7:30 am      Procedure Time: 8:30 am     Location of Procedure:                    _x _  [facility] (4th Floor)                        PREPARATION FOR COLONOSCOPY WITH MOVIPREP   Starting 5 days prior to your procedure Friday 4/15 do not eat nuts, seeds, popcorn, corn, beans, peas,  salads, or any raw vegetables.  Do not take any fiber supplements (e.g. Metamucil, Citrucel, and Benefiber).  THE DAY BEFORE YOUR PROCEDURE         DATE: Tuesday 4/19  1.  Drink clear liquids the entire day-NO SOLID FOOD  2.  Do not drink anything colored red or purple.  Avoid juices with pulp.  No orange juice.  3.  Drink at least 64 oz. (8 glasses) of fluid/clear liquids during the day to prevent dehydration and help the prep work efficiently.  CLEAR LIQUIDS INCLUDE: Water Jello Ice Popsicles Tea (sugar ok, no milk/cream) Powdered fruit flavored drinks Coffee (sugar ok, no milk/cream) Gatorade Juice: apple, white grape, white cranberry  Lemonade Clear bullion, consomm, broth Carbonated beverages (any kind) Strained chicken noodle soup Hard Candy                             4.  In the morning, mix first dose of MoviPrep solution:    Empty 1 Pouch A and 1 Pouch B into the disposable container    Add lukewarm drinking water to the top line of the container. Mix to dissolve    Refrigerate (mixed solution should be used within 24 hrs)  5.  Begin drinking the prep at 5:00 p.m. The MoviPrep container is divided by 4 marks.   Every 15 minutes drink the solution down to the next mark (approximately 8 oz) until the full liter is complete.   6.  Follow completed prep with 16 oz of clear liquid of your choice (Nothing  red or purple).  Continue to drink clear liquids until bedtime.  7.  Before going to bed, mix second dose of MoviPrep solution:    Empty 1 Pouch A and 1 Pouch B into the disposable container    Add lukewarm drinking water to the top line of the container. Mix to dissolve    Refrigerate  THE DAY OF YOUR PROCEDURE      DATE: Wednesday 4/20  Beginning at 3:30 a.m. (5 hours before procedure):         1. Every 15 minutes, drink the solution down to the next mark (approx 8 oz) until the full liter is complete.  2. Follow completed prep with 16 oz. of clear liquid of your choice.    3. You may drink clear liquids until 6:30 am  (2 HOURS BEFORE PROCEDURE).   MEDICATION INSTRUCTIONS  Unless otherwise instructed, you should take regular prescription medications with a small sip of water   as early as possible the morning  of your procedure. .  Additional medication instructions: Do not take HCTZ am of procedure.         OTHER INSTRUCTIONS  You will need a responsible adult at least 70 years of age to accompany you and drive you home.   This person must remain in the waiting room during your procedure.  Wear loose fitting clothing that is easily removed.  Leave jewelry and other valuables at home.  However, you may wish to bring a book to read or  an iPod/MP3 player to listen to music as you wait for your procedure to start.  Remove all body piercing jewelry and leave at home.  Total time from sign-in until discharge is approximately 2-3 hours.  You should go home directly after your procedure and rest.  You can resume normal activities the  day after your procedure.  The day of your procedure you should not:   Drive   Make legal decisions   Operate machinery   Drink alcohol   Return to work  You will receive specific instructions about eating, activities and medications before you leave.    The above instructions have been reviewed and explained to me by   Ulis Rias RN  November 11, 2009 9:34 AM     I fully understand and can verbalize these instructions _____________________________ Date _________

## 2010-09-06 NOTE — Progress Notes (Signed)
Summary: stop blood thinner?  Phone Note Call from Patient   Caller: Brett [name]  GI Call For: [name] MD Summary of Call: Pt is having colonoscopy on November 24, 2009 and needs to stop Pletel.   Dr. Jarold Motto [phone] Ask Brett [name] or Brett [name] Initial call taken by: Lynann Beaver CMA,  November 11, 2009 1:29 PM  Follow-up for Phone Call        ok.  resume day after procedure Follow-up by: [name] MD,  November 15, 2009 3:37 PM  Additional Follow-up for Phone Call Additional follow up Details #1::        Brett [name] at GI notified. Additional Follow-up by: Gladis Riffle, RN,  November 15, 2009 3:53 PM

## 2010-09-06 NOTE — Procedures (Signed)
Summary: Colonoscopy  Patient: Brett [name] Note: All result statuses are Final unless otherwise noted.  Tests: (1) Colonoscopy (COL)   COL Colonoscopy           DONE     [facility]     [address]     [place], [place]  [address]           COLONOSCOPY PROCEDURE REPORT           PATIENT:  Brett [name]  MR#:  [id number]     BIRTHDATE:  [birth year], 68 yrs. old  GENDER:  male     ENDOSCOPIST:  Vania Rea. Jarold Motto, MD, [facility]     REF. BY:     PROCEDURE DATE:  11/24/2009     PROCEDURE:  Colonoscopy with biopsy and snare polypectomy     ASA CLASS:  Class II     INDICATIONS:  Routine Risk Screening, history of pre-cancerous     (adenomatous) colon polyps     MEDICATIONS:   Fentanyl 75 mcg IV, Versed 7 mg IV           DESCRIPTION OF PROCEDURE:   After the risks benefits and     alternatives of the procedure were thoroughly explained, informed     consent was obtained.  Digital rectal exam was performed and     revealed no abnormalities.   The LB [id number] endoscope     was introduced through the anus and advanced to the cecum, which     was identified by both the appendix and ileocecal valve, without     limitations.  The quality of the prep was excellent, using     MoviPrep.  The instrument was then slowly withdrawn as the colon     was fully examined.     <<PROCEDUREIMAGES>>           FINDINGS:  Moderate diverticulosis was found.  There were multiple     polyps identified and removed. in the rectum and sigmoid colon.     small flapt polyps cold biopsy and hot snare excised.MIXED POLYPS.     Retroflexed views in the rectum revealed not done.    The scope     was then withdrawn from the patient and the procedure completed.           COMPLICATIONS:  None     ENDOSCOPIC IMPRESSION:     1) Moderate diverticulosis     2) Polyps, multiple in the rectum and sigmoid colon     RECOMMENDATIONS:     1) Follow up colonoscopy in 5 years     2) Await pathology results   3) Continue current medications     REPEAT EXAM:  No           ______________________________     Vania Rea. Jarold Motto, MD, Brett [name]           CC:           n.     eSIGNED:   Vania Rea. Patterson at 11/24/2009 09:06 AM           Brett [name], [id number]  Note: An exclamation mark (!) indicates a result that was not dispersed into the flowsheet. Document Creation Date: 11/24/2009 9:07 AM _______________________________________________________________________  (1) Order result status: Final Collection or observation date-time: 11/24/2009 09:00 Requested date-time:  Receipt date-time:  Reported date-time:  Referring Physician:   Ordering Physician: Sheryn Bison [phone]) Specimen Source:  Source: Launa Grill Order Number: [id number]  Lab site:   Appended Document: Colonoscopy     Procedures Next Due Date:    Colonoscopy: 11/2014

## 2010-09-08 NOTE — Progress Notes (Signed)
Summary: refill  Phone Note Refill Request Call back at [phone] Message from:  Patient---live call  Refills Requested: Medication #1:  AVAPRO 300 MG TABS Take 1 tablet by mouth once a day [facility]  Initial call taken by: Warnell Forester,  August 15, 2010 9:52 AM  Follow-up for Phone Call        Rx faxed to pharmacy Follow-up by: Alfred Levins, CMA,  August 15, 2010 10:49 AM    Prescriptions: AVAPRO 300 MG TABS (IRBESARTAN) Take 1 tablet by mouth once a day  #90 x 3   Entered by:   Alfred Levins, CMA   Authorized by:   [name] MD   Signed by:   Alfred Levins, CMA on 08/15/2010   Method used:   Faxed to ...       [facility] ([facility]       [address] [place], [place]  [address]       Ph: [phone]       Fax: [phone]   RxID:   [id number]

## 2010-09-08 NOTE — Assessment & Plan Note (Signed)
Summary: F1Y      Allergies Added: NKDA  Visit Type:  1 year follow up Primary Provider:  [name] MD  CC:  No complaints.  History of Present Illness: This is a 70 year old gentleman with coronary and peripheral arterial disease, presenting today for followup evaluation. The patient underwent coronary bypass surgery in 2006. 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  He is doing very well at present. The patient denies chest pain, dyspnea, orthopnea, PND, edema, palpitations, lightheadedness, or syncope.  He has no leg pain with ambulation or exercise. No rest pain or ulceration.  Current Medications (verified): 1)  Avapro 300 Mg Tabs (Irbesartan) .... Take 1 Tablet By Mouth Once A Day 2)  Lipitor 80 Mg Tabs (Atorvastatin Calcium) .... Take 1 Tablet By Mouth Once A Day 3)  Metoprolol Succinate 50 Mg Tb24 (Metoprolol Succinate) .... Take 1 Tablet By Mouth Once A Day 4)  Aspirin Ec 325 Mg Tbec (Aspirin) .... Once Daily 5)  Diasense Multivitamin   Tabs (Multiple Vitamins-Minerals) .... One Daily 6)  Hydrochlorothiazide 12.5 Mg  Tabs (Hydrochlorothiazide) .... One By Mouth Daily 7)  Vitamin C 500 Mg  Tabs (Ascorbic Acid) .... Once Daily 8)  Caltrate 600+d 600-400 Mg-Unit  Tabs (Calcium Carbonate-Vitamin D) .... Once Daily 9)  Niaspan 1000 Mg Cr-Tabs (Niacin (Antihyperlipidemic)) .... Take 1 Tablet By Mouth Once A Day 10)  Cilostazol 100 Mg  Tabs (Cilostazol) .... Take 1 Tablet By Mouth Two Times A Day 11)  Nitroglycerin 0.3 Mg Subl (Nitroglycerin) .... One Under Tongue As Needed Chest Pain 12)  Niacin Cr 500 Mg Cr-Caps (Niacin) .... 2 By Mouth Daily  Allergies (verified): No Known Drug Allergies  Past History:  Past medical history reviewed for relevance to current acute and chronic problems.  Past Medical History: Reviewed history from 07/26/2009 and no changes required. 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) CAROTID STENOSIS (ICD-433.10) UNSPECIFIED GENERAL MEDICAL EXAMINATION (ICD-V70.9) HYPERGLYCEMIA (ICD-790.29) TOBACCO ABUSE (ICD-305.1)  Review of Systems       Negative except as per HPI   Vital Signs:  Patient profile:   70 year old male Height:      66 inches Weight:      162 pounds BMI:     26.24 Pulse rate:   97 / minute Pulse rhythm:   regular Resp:     18 per minute BP sitting:   104 / 54  (left arm) Cuff size:   large  Vitals Entered By: Vikki Ports (August 16, 2010 9:27 AM)  Serial Vital Signs/Assessments:  Time      Position  BP       Pulse  Resp  Temp     By           R Arm     100/54                         Vikki Ports   Physical Exam  General:  Pt is alert and oriented, in no acute distress. HEENT: normal Neck: normal carotid upstrokes with a left carotid bruit, JVP normal Lungs: CTA CV: RRR without murmur or gallop Abd: soft, NT, positive BS, no bruit, no organomegaly Ext: no clubbing, cyanosis, or edema. peripheral pulses 2+ and equal Skin: warm and dry without rash    EKG  Procedure date:  08/16/2010  Findings:  Sinus rhythm 97 bpm, within normal limits.  Impression & Recommendations:  Problem # 1:  CAD, ARTERY BYPASS GRAFT (ICD-414.04) Stable without angina. Continue current medical program. All risk factors are well-controlled except for ongoing tobacco. We discussed the importance of tobacco cessation at length, especially in the setting of coronary, carotid, and lower extremity arterial disease.  His updated medication list for this problem includes:    Metoprolol Succinate 50 Mg Tb24 (Metoprolol succinate) .Marland Kitchen... Take 1 tablet by mouth once a day    Aspirin Ec 325 Mg Tbec (Aspirin) ..... Once daily    Cilostazol 100 Mg Tabs (Cilostazol) .Marland Kitchen... Take 1 tablet by mouth two times a day    Nitroglycerin 0.3 Mg Subl  (Nitroglycerin) ..... One under tongue as needed chest pain  Orders: EKG w/ Interpretation ([address]) Carotid Duplex (Carotid Duplex) Arterial Duplex Lower Extremity (Arterial Duplex Low)  Problem # 2:  PERIPHERAL VASCULAR DISEASE (ICD-443.9) Stable without claudication on appropriate med Rx. Will repeat ABI's later this year at the time of his carotid duplex.  Problem # 3:  CAROTID STENOSIS (ICD-433.10) Moderate left ICA stenosis - due for followup duplex.  His updated medication list for this problem includes:    Aspirin Ec 325 Mg Tbec (Aspirin) ..... Once daily    Cilostazol 100 Mg Tabs (Cilostazol) .Marland Kitchen... Take 1 tablet by mouth two times a day  Orders: EKG w/ Interpretation ([address]) Carotid Duplex (Carotid Duplex) Arterial Duplex Lower Extremity (Arterial Duplex Low)  Patient Instructions: 1)  Your physician recommends that you continue on your current medications as directed. Please refer to the Current Medication list given to you today. 2)  Your physician wants you to follow-up in:  1 YEAR.  You will receive a reminder letter in the mail two months in advance. If you don't receive a letter, please call our office to schedule the follow-up appointment. 3)  Your physician has requested that you have an ankle brachial index (ABI). During this test an ultrasound and blood pressure cuff are used to evaluate the arteries that supply the arms and legs with blood. Allow thirty minutes for this exam. There are no restrictions or special instructions. 4)  Your physician has requested that you have a carotid duplex. This test is an ultrasound of the carotid arteries in your neck. It looks at blood flow through these arteries that supply the brain with blood. Allow one hour for this exam. There are no restrictions or special instructions.

## 2010-09-15 ENCOUNTER — Encounter: Payer: Self-pay | Admitting: Internal Medicine

## 2010-09-16 ENCOUNTER — Ambulatory Visit (INDEPENDENT_AMBULATORY_CARE_PROVIDER_SITE_OTHER): Payer: Medicare Other | Admitting: Internal Medicine

## 2010-09-16 ENCOUNTER — Encounter: Payer: Self-pay | Admitting: Internal Medicine

## 2010-09-16 DIAGNOSIS — I739 Peripheral vascular disease, unspecified: Secondary | ICD-10-CM

## 2010-09-16 DIAGNOSIS — E785 Hyperlipidemia, unspecified: Secondary | ICD-10-CM

## 2010-09-16 DIAGNOSIS — I10 Essential (primary) hypertension: Secondary | ICD-10-CM

## 2010-09-16 DIAGNOSIS — Z0000 Encounter for general adult medical examination without abnormal findings: Secondary | ICD-10-CM

## 2010-09-16 DIAGNOSIS — R7309 Other abnormal glucose: Secondary | ICD-10-CM

## 2010-09-16 DIAGNOSIS — I6529 Occlusion and stenosis of unspecified carotid artery: Secondary | ICD-10-CM

## 2010-09-16 DIAGNOSIS — I25810 Atherosclerosis of coronary artery bypass graft(s) without angina pectoris: Secondary | ICD-10-CM

## 2010-09-16 DIAGNOSIS — F17200 Nicotine dependence, unspecified, uncomplicated: Secondary | ICD-10-CM

## 2010-09-16 LAB — CBC WITH DIFFERENTIAL/PLATELET
Basophils Absolute: 0 10*3/uL (ref 0.0–0.1)
Basophils Relative: 0.3 % (ref 0.0–3.0)
Eosinophils Absolute: 0.1 10*3/uL (ref 0.0–0.7)
Hemoglobin: 15.7 g/dL (ref 13.0–17.0)
Lymphs Abs: 3 10*3/uL (ref 0.7–4.0)
MCHC: 35.6 g/dL (ref 30.0–36.0)
MCV: 99.6 fl (ref 78.0–100.0)
Monocytes Absolute: 0.7 10*3/uL (ref 0.1–1.0)
Neutro Abs: 9.6 10*3/uL — ABNORMAL HIGH (ref 1.4–7.7)
RBC: 4.43 Mil/uL (ref 4.22–5.81)
RDW: 13.7 % (ref 11.5–14.6)

## 2010-09-16 LAB — HEMOGLOBIN A1C: Hgb A1c MFr Bld: 5.6 % (ref 4.6–6.5)

## 2010-09-16 LAB — HEPATIC FUNCTION PANEL
AST: 24 U/L (ref 0–37)
Alkaline Phosphatase: 85 U/L (ref 39–117)
Bilirubin, Direct: 0.1 mg/dL (ref 0.0–0.3)
Total Bilirubin: 0.9 mg/dL (ref 0.3–1.2)

## 2010-09-16 LAB — BASIC METABOLIC PANEL
CO2: 28 mEq/L (ref 19–32)
Chloride: 104 mEq/L (ref 96–112)
Glucose, Bld: 105 mg/dL — ABNORMAL HIGH (ref 70–99)
Sodium: 142 mEq/L (ref 135–145)

## 2010-09-16 LAB — LIPID PANEL: Total CHOL/HDL Ratio: 3

## 2010-09-16 NOTE — Assessment & Plan Note (Signed)
Patient has no symptoms. Continue current medications. Check laboratory work today.

## 2010-09-16 NOTE — Assessment & Plan Note (Signed)
Check labs today. Previously well controlled.

## 2010-09-16 NOTE — Progress Notes (Addendum)
Subjective:    Patient ID: Brett [name], male    DOB: [birth year], 70 y.o.   MRN: [id number]  HPI  patient is here for Medicare wellness visit. In addition patient has known coronary artery disease, hyperlipidemia, and hypertension. Patient denies any chest pain, shortness breath or PND. He is tolerating his medications without difficulty. Patient also has a history of hyperglycemia.  Subjective:    Brett [name] is a 70 y.o. male who presents for a welcome to Medicare exam.   Cardiac risk factors: known CAD.  Depression Screen (Note: if answer to either of the following is "Yes", a more complete depression screening is indicated)  Q1: Over the past two weeks, have you felt down, depressed or hopeless? no Q2: Over the past two weeks, have you felt little interest or pleasure in doing things? no  Activities of Daily Living In your present state of health, do you have any difficulty performing the following activities?:  Preparing food and eating?: No Bathing yourself: No Getting dressed: No Using the toilet:No Moving around from place to place: No In the past year have you fallen or had a near fall?:No  Current exercise habits: The patient does not participate in regular exercise at present.  Dietary issues discussed: no concerns  Hearing difficulties: No Safe in current home environment: yes Cognitive impairment: pt denies any concerns--pt has no motor or sensory concerns and has no cognitive concerns  The following portions of the patient's history were reviewed and updated as appropriate: allergies, current medications, past family history, past medical history, past social history, past surgical history and problem list. Review of Systems A comprehensive review of systems was negative.    Objective:      Blood pressure 134/76, pulse 114, temperature 98.3 F (36.8 C), temperature source Oral, height 5' 5.75" (1.67 m), weight 161 lb (73.029 kg). Body mass index is  26.18 kg/(m^2).   Physical Exam  Constitutional: He appears well-developed and well-nourished.  HENT:  Head: Normocephalic and atraumatic.  Right Ear: External ear normal.  Left Ear: External ear normal.  Nose: Nose normal.  Eyes: Right eye exhibits no discharge. No scleral icterus.  Neck: Neck supple. No thyromegaly present.  Cardiovascular: Normal rate, regular rhythm and normal heart sounds.   No murmur heard. Pulmonary/Chest: Effort normal and breath sounds normal. No respiratory distress.  Abdominal: Soft. Bowel sounds are normal. He exhibits no distension. There is no tenderness.  Genitourinary: Prostate normal.  Musculoskeletal: He exhibits no edema.  Lymphadenopathy:    He has no cervical adenopathy.  Neurological: He is alert. No cranial nerve deficit.  Skin: Skin is warm and dry.  Psychiatric: He has a normal mood and affect.    Assessment:         Plan:     During the course of the visit the patient was educated and counseled about appropriate screening and preventive services including:   Pneumococcal vaccine   Influenza vaccine  Td vaccine  Colorectal cancer screening  Diabetes screening  Smoking cessation counseling  Patient Instructions (the written plan) was given to the patient.   Review of Systems  Constitutional: Negative for diaphoresis, activity change and appetite change.  HENT: Negative for congestion, rhinorrhea and neck pain.   Eyes: Negative for photophobia and discharge.  Respiratory: Negative for choking.   Cardiovascular: Negative for chest pain.  Genitourinary: Negative for dysuria and flank pain.  Musculoskeletal: Negative for back pain and arthralgias.  Neurological: Negative for dizziness.  Hematological: Negative  for adenopathy.  Psychiatric/Behavioral: Negative for confusion and agitation.       Objective:   Physical Exam  Constitutional: He appears well-developed and well-nourished.  HENT:  Head: Normocephalic and  atraumatic.  Right Ear: External ear normal.  Left Ear: External ear normal.  Nose: Nose normal.  Eyes: Right eye exhibits no discharge. No scleral icterus.  Neck: Neck supple. No thyromegaly present.  Cardiovascular: Normal rate and regular rhythm.   Murmur (3/6 sem) heard. Pulses:      Popliteal pulses are 2+ on the right side, and 2+ on the left side.       Dorsalis pedis pulses are 0 on the right side, and 0 on the left side.       Posterior tibial pulses are 0 on the right side, and 0 on the left side.  Pulmonary/Chest: Effort normal and breath sounds normal. No respiratory distress.  Abdominal: Soft. Bowel sounds are normal. He exhibits no distension. There is no tenderness.  Genitourinary: Prostate normal.  Musculoskeletal: He exhibits no edema.  Lymphadenopathy:    He has no cervical adenopathy.  Neurological: He is alert. No cranial nerve deficit.  Skin: Skin is warm and dry.  Psychiatric: He has a normal mood and affect.          Assessment & Plan:

## 2010-09-16 NOTE — Assessment & Plan Note (Signed)
Adequate control. Continue current medications. 

## 2010-09-16 NOTE — Assessment & Plan Note (Signed)
Pulses are nonpalpable in the distal lower extremities. Patient has scheduled an arterial Doppler.

## 2010-09-16 NOTE — Assessment & Plan Note (Signed)
Discussed need for exercise, low carbohydrate diet. We'll check A1c.

## 2010-09-16 NOTE — Assessment & Plan Note (Signed)
Patient has a carotid ultrasound scheduled.

## 2010-09-23 ENCOUNTER — Encounter: Payer: Self-pay | Admitting: Cardiovascular Disease

## 2010-09-23 ENCOUNTER — Encounter (INDEPENDENT_AMBULATORY_CARE_PROVIDER_SITE_OTHER): Payer: Medicare Other

## 2010-09-23 DIAGNOSIS — I739 Peripheral vascular disease, unspecified: Secondary | ICD-10-CM

## 2010-09-23 DIAGNOSIS — I6529 Occlusion and stenosis of unspecified carotid artery: Secondary | ICD-10-CM

## 2010-10-24 ENCOUNTER — Telehealth: Payer: Self-pay | Admitting: Internal Medicine

## 2010-10-24 DIAGNOSIS — I6529 Occlusion and stenosis of unspecified carotid artery: Secondary | ICD-10-CM

## 2010-10-24 DIAGNOSIS — E785 Hyperlipidemia, unspecified: Secondary | ICD-10-CM

## 2010-10-24 MED ORDER — ATORVASTATIN CALCIUM 80 MG PO TABS: 80.0000 mg | ORAL_TABLET | Freq: Every day | ORAL | Status: DC

## 2010-10-24 MED ORDER — CILOSTAZOL 100 MG PO TABS: 100.0000 mg | ORAL_TABLET | Freq: Two times a day (BID) | ORAL | Status: DC

## 2010-10-24 NOTE — Telephone Encounter (Signed)
Sent in electronically .  

## 2010-10-24 NOTE — Telephone Encounter (Signed)
Pt is needing scripts for Lipitor 80 mg tab and Cilostavol 100 mg. 90 day supply on both to [facility].

## 2010-12-20 NOTE — Assessment & Plan Note (Signed)
[facility]                            CARDIOLOGY OFFICE NOTE   NAME:Brett [name]                      MRN:          [id number]  DATE:06/09/2008                            DOB:          11-Feb-1941    Brett [name] was seen in annual followup at the [facility] Cardiology  office on June 09, 2008.  He is a very nice 70 year old gentleman  with coronary and peripheral arterial disease.  He underwent coronary  bypass surgery in 2006.  He has a history of left calf claudication with  an ABI of 0.77 on the left.  He had his noninvasive study was suggestive  of left popliteal artery stenosis.  He has responded very well with  conservative management with the use of cilostazol.  He is moderately  active and has no symptoms with walking.  He specifically denies calf or  foot pain.  He has had no ulcerations or nonhealing wounds.  He denies  chest pain or chest pressure.  His lipids and blood pressure have been  managed by Dr. Cato Mulligan, and he has good control of his cardiac risk  factors.   CURRENT MEDICATIONS:  1. Aspirin 325 mg daily.  2. Multivitamin 1 daily.  3. Vitamin C 500 mg daily.  4. Calcium 600 mg daily.  5. Niacin 500 mg daily.  6. Avapro 300 mg daily.  7. Lipitor 80 mg daily.  8. Cilostazol 100 mg b.i.d.  9. Toprol XL 50 mg daily.  10.Hydrochlorothiazide 12.5 mg daily.   PHYSICAL EXAMINATION:  GENERAL:  The patient is alert and oriented.  He  is in no acute distress.  VITAL SIGNS:  Weight is 173 pounds, blood pressure 114/64, and heart  rate is 93.  HEENT:  Normal.  NECK:  Normal carotid upstrokes.  No bruits.  JVP normal.  LUNGS:  Clear bilaterally.  ABDOMEN:  Soft and nontender.  No organomegaly.  EXTREMITIES:  No clubbing, cyanosis, or edema.  SKIN:  Warm and dry without rash.   EKG shows normal sinus rhythm and is essentially within normal limits.   ASSESSMENT:  1. Coronary artery disease status post coronary artery bypass   grafting.  The patient is asymptomatic with no angina.  We will      continue his current medical program.  Followup office visit in 12      months.  No indication for stress testing at this point.  2. Dyslipidemia.  His LDL is at goal.  His HDL is very low in the 20s.      I have asked him to increase his niacin from 500 mg to 1000 mg      daily.  He was counseled about the possibility for flushing and      this can be alleviated with taking his aspirin 30 minutes prior.      He has followup lipids scheduled with Dr. Cato Mulligan in late December      2009, at the time of his followup office visit.  3. Hypertension.  Blood pressure control is excellent today.  Continue      Avapro, Toprol,  and hydrochlorothiazide.  4. Lower extremity peripheral arterial disease.  He is asymptomatic on      cilostazol  5. Carotid stenosis.  He has 60-79% left internal carotid stenosis on      the low end of that range.  He is due for followup carotid duplex      in December 2009.     Brett [name]. Brett Seltzer, MD  Electronically Signed    MDC/MedQ  DD: 06/09/2008  DT: 06/10/2008  Job #: [id number]   cc:   Brett Mole. Swords, MD

## 2010-12-20 NOTE — Progress Notes (Signed)
[facility]                        PERIPHERAL VASCULAR OFFICE NOTE   NAME:Brett [name]                      MRN:          [id number]  DATE:05/31/2007                            DOB:          1940/09/09    Brett [name] returns for followup at the [facility] Peripheral Vascular  Office on May 31, 2007.  Brett [name] is a 70 year old gentleman who  has been followed by Dr. Samule Ohm for coronary and peripheral arterial  disease.  He was initially seen because of left calf claudication.  His  noninvasive study showed an normal ABI on the right and an ABI of 0.77  on the left.  He was found to have elevated velocities in the left  popliteal artery suggestive of significant stenosis there.  Conservative  management was decided upon and he has actually been doing quite well.  He is not limited at all by leg pain at this point.  He has occasional  tightness in the left calf with exercise but is really not limited.  He  is able to mow his lawn and do other regular activities.  He does not  participate in regular aerobic activity or exercise.   Brett [name] has also had coronary bypass surgery in 2006.  He has never  been symptomatic but had an abnormal stress study ultimately leading to  a diagnosis of multi-vessel CAD and subsequently undergoing bypass.  He  denies chest pain, dyspnea, orthopnea, or PND.  He has had no  palpitations or edema.  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  He otherwise is not limited by any symptoms.   CURRENT MEDICATIONS:  1. Aspirin 325 mg daily.  2. Multivitamin daily.  3. Vitamin C 500 mg daily.  4. Calcium 600 mg daily.  5. Niacin 500 mg daily.  6. Avapro 300 mg daily.  7. Lipitor 80 mg daily.  8. Cilostazol 100 mg twice daily.  9. Metoprolol succinate 50 mg daily.   ALLERGIES:  NKDA.   PHYSICAL EXAMINATION:  GENERAL:  He is alert and oriented, no acute  distress.  VITAL SIGNS:  Blood pressure  146/60, heart rate 96, respiratory rate 16.  HEENT:  Normal.  NECK:  Normal carotid upstrokes with soft bilateral bruits.  Jugular  venous pressure is normal.  LUNGS:  Clear to auscultation bilaterally.  HEART:  Regular rate and rhythm without murmurs or gallops.  ABDOMEN:  Soft, nontender.  No organomegaly.  EXTREMITIES:  No clubbing, cyanosis, or edema.  Pedal pulses are 2 plus  on the right and 1 plus on the left.   EKG shows normal sinus rhythm and is within normal limits.   ASSESSMENT:  1. Coronary artery disease, status post coronary artery bypass graft.      He remains stable on his current medical regimen.  He remains on      antiplatelet therapy with aspirin, lipid-lowering therapy with a      combination of Lipitor and Niacin, as well as a beta-blocker.  He      is not having symptoms at present.  No indication  for further      evaluation at this point.  2. Dyslipidemia.  Lipids are followed by Dr. Cato Mulligan on dual therapy as      noted.  3. Peripheral arterial disease.  He has stable claudication involving      the left calf likely due to left popliteal artery stenosis.  His      symptoms are minimal.  Continue Cilostazol.  I encouraged increased      exercise.  4. Carotid arterial disease.  His carotid ultrasound from earlier this      month showed stable bilateral disease.  He has 40-59% internal      carotid artery stenosis on the right, and 60-79% left internal      carotid artery stenosis based on velocity measurements.  He will      have followup carotid ultrasound in 6 months.   I will see Brett [name] back in 1 year.  I would be happy to see him  sooner if he had any new problems.     Veverly Fells. Excell Seltzer, MD  Electronically Signed    MDC/MedQ  DD: 05/31/2007  DT: 05/31/2007  Job #: [id number]   cc:   Valetta Mole. Swords, MD

## 2010-12-23 NOTE — Discharge Summary (Signed)
NAME:  Brett [name], Brett [name] NO.:  [id number]   MEDICAL RECORD NO.:  [id number]          PATIENT TYPE:  INP   LOCATION:  2014                         FACILITY:  [facility]   PHYSICIAN:  Salvatore Decent. Cornelius Moras, M.D. DATE OF BIRTH:  1940-09-23   DATE OF ADMISSION:  12/07/2004  DATE OF DISCHARGE:                                 DISCHARGE SUMMARY   ADMISSION DIAGNOSIS:  Abnormal stress test.   DISCHARGE DIAGNOSIS:  1.  Hypertension.  2.  Hypercholesterolemia.  3.  Remote history of duodenal ulcer.  4.  Long standing tobacco abuse.  5.  Leukocytosis both pre and postoperatively.  6.  Coronary artery disease status post coronary artery bypass grafting x 5.   ALLERGIES:  No known drug allergies.   HISTORY OF PRESENT ILLNESS:  This is a 70 year old male who has a past  medical history significant for hypertension, hypercholesterolemia, and long-  standing tobacco abuse.  The patient recently underwent a colonoscopy and  was noted to have an irregular heart rhythm at the time.  EKG was performed  and this was abnormal.  The patient was subsequently referred from Dr.  Cato Mulligan for an elective Cardiolite exam.  This was performed on November 08, 2004.  The patient developed some shortness of breath and bilateral lower  extremity discomfort suggesting claudication, although he did not develop  any chest pain.  The patient developed some ST segment changes across the  inferior leads but stress was possible for the presence of reversible defect  involving the inferior wall suggestive of myocardial coronary ischemia.  Resting ejection fraction was 62%.  After this evaluation by Dr. Shelva Majestic,  the patient was referred to Dr. Nanetta Batty for elective cardiac cath  which was performed November 14, 2004, and revealed severe three vessel  coronary artery disease with a normal left ventricular  function.  The  patient was subsequently referred to Dr. Tressie Stalker of [facility] regarding  surgical  revascularization.  Dr. Cornelius Moras evaluated the patient and it was his  opinion that the patient should proceed with coronary artery bypass surgery.  Initial plans for surgery were postponed due to mild leukocytosis discovered  on routine preoperative bloodwork.  The patient also developed a rash  suggestive of a possible drug reaction.  All cultures and other tests were  unremarkable.   ALLERGIES:  No known drug allergies.   HOSPITAL COURSE:  The patient was admitted on Dec 07, 2004, and he was  clinically unchanged.  He did have a mild, diffuse, erythematous rash  present which he reported having been present for the last several weeks.  He did not have any fevers and his physical exam was benign.  The white  count remained mildly elevated at 14,400.  On Dec 07, 2004, the patient was  taken to the OR for coronary artery bypass grafting x 5.  Endoscopic graft  harvesting was performed on the right lower extremity.  The left internal  mammary artery was grafted to the LAD, saphenous vein was grafted to the PD,  saphenous vein was grafted to OM3 and in sequence to  OM4, and saphenous vein  was grafted to the D2.  The patient tolerated the procedure well and was  hemodynamically stable immediately postoperatively.  The patient was  transferred from the OR to the SICU in stable condition.  He was noted to  have some periorbital swelling early postoperatively, and this was treated  with intravenous steroids.  The patient was extubated without complications  and woke up from anesthesia neurologically intact.  He has been volume  overloaded postoperatively and has been diuresed accordingly.   On postoperative day one, he was doing quite well with no complaints.  The  patient was afebrile with normal sinus rhythm and stable vital signs.  The  patient's postoperative course has progressed as expected.  He is mildly  sore.  He is afebrile with stable vital signs and maintaining a normal sinus  rhythm.   On physical exam on postoperative day two, chest was clear to  auscultation, the sternal incision is clean and dry, the abdomen is soft,  and the rash has resolved.  Secondary to the patient's elevated white count  both preoperatively and postoperatively, he has been monitored closely.  The  patient was given a dose of IV steroids approximately 24 hours before and  the rash was resolved.  The postoperative elevated white count is thought to  be potentially exacerbated by the IV steroids with preexisting mild  leukocytosis.   LABORATORY DATA:  CBC on Dec 09, 2004, hemoglobin 11.7, hematocrit 32.6,  white count 21.3, platelets 291.  BMP on Dec 09, 2004, sodium 137, potassium  4.2, BUN 25, creatinine 1.1, glucose 150.   CONDITION ON DISCHARGE:  Stable.   DISCHARGE MEDICATIONS:  1.  Aspirin 325 mg daily.  2.  Atenolol 25 mg b.i.d.  3.  Avapro 150 mg q.a.m.  4.  Lipitor 10 mg daily.  5.  Lasix 40 mg daily x 7 days.  6.  K-Dur 20 mEq daily x 7 days.  7.  Wellbutrin 150 mg b.i.d.  8.  Lorazepam 150 mg b.i.d. p.r.n. anxiety.  9.  Tylox 1-2 p.o. q.4-6h. p.r.n. pain.   DISCHARGE INSTRUCTIONS:  Activities:  No driving and no lifting more than 10  pounds and the patient is to continue daily breathing and walking exercises.  Diet:  Low salt, low fat.  Wound care:  The patient may shower daily and  clean the incisions with soap and water.  If wound problems arise, he should  contact the [facility] office.  Follow up appointment with Dr. Allyson Sabal, call for his  appointment two weeks after discharge.  A chest x-ray will be taken at that  time which will be given to the patient for him to bring with him to his  follow up appointment with Dr. Cornelius Moras on Dec 26, 2004, at 3:30.      AY/MEDQ  D:  12/09/2004  T:  12/09/2004  Job:  [id number]

## 2010-12-23 NOTE — Cardiovascular Report (Signed)
NAMELAURENT, [name] NO.:  [id number]   MEDICAL RECORD NO.:  [id number]          PATIENT TYPE:  OIB   LOCATION:  2899                         FACILITY:  [facility]   PHYSICIAN:  Nanetta Batty, M.D.   DATE OF BIRTH:  [birth year]   DATE OF PROCEDURE:  DATE OF DISCHARGE:                              CARDIAC CATHETERIZATION   PROCEDURE DATE:  November 14, 2004.   HISTORY OF PRESENT ILLNESS:  Brett [name] is a 69 year old white married male  patient of Dr. Shelva Majestic and Dr. Cato Mulligan who comes to the same day area for a  cardiac catheterization.  He apparently most recently on October 19, 2004  underwent colonoscopy.  At that time, he was noted to have PACs.  His GI  physician, I believe Dr. Jarold Motto referred him back to his primary care  doctor concerned about his PACs and his EKG during the colonoscopy.  It was  then suggested that he go for a Cardiolite test.  This was performed and  apparently showed an abnormality and he had inferior wall ischemia.  The  patient then went to see Dr. Shelva Majestic for further evaluation who suggested  undergoing cardiac catheterization after reviewing the Cardiolite test.  Dr.  Cameron Ali contacted Dr. Allyson Sabal.  Thus, this cardiac cath has been set up.  Cardiac risk factors include positive tobacco, positive age, positive male,  positive hypertension, no diabetes, positive elevated cholesterol, positive  premature family history of heart disease.   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  He denies any  history of MI, CHF, CVA or diabetes.   MEDICATIONS:  Avapro 150 mg every day and multivitamin and zinc, glucose and  calcium.  He does not regularly take an aspirin every day.  He was given an  aspirin here in the same day area.   ALLERGIES:  No known drug allergies.   FAMILY HISTORY:  Mother died  at age 14 from old age.  Father died at age 11  from an MI.  Paternal grandfather died at age 65 from an MI.  He has no  siblings.   SOCIAL HISTORY:  He has been married for 18 years.  He has one daughter, age  78.  He smokes one pack of cigarettes per day since the age of 78.  He does  not drink any alcohol.  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  He  does not do any formal exercise but he is very active with work around [employer], gardening and maintenance.   REVIEW OF SYSTEMS:  He denies any recent cough, cold or virus.  No GERD.  No  abdominal pain.  No black, tarry stools.  No unilateral weakness.  No cramps  or claudication except when he was on the treadmill.  Positive knee-aching  at times.  No lower extremity edema.  He does have some fleeting  palpitations and fluttering on occasion.  No lower extremity edema.  No  presyncope.  No syncope.  Other systems are negative.   PHYSICAL EXAMINATION:  VITAL SIGNS:  Blood pressure is 181/80, heart rate is  84, temperature 97.2, O2 saturations are 96.  Height is 5 feet 7 inches.  Weight is 170 pounds.  EKG shows normal sinus rhythm 85.  GENERAL:  In no acute distress.  He is pleasant.  NECK:  No thyromegaly.  He has 2+ carotids without any bruits.  RESPIRATIONS:  Lungs are clear bilaterally with inspiratory effort.  CARDIAC:  Heart sounds are regular with a soft murmur.  ABDOMEN:  Bowel sounds are present x 4.  No mass or tenderness.  LOWER EXTREMITIES:  He has 2+ femorals, positive bilateral bruits.  He has  decreased pedal pulses, 2+ posterior tibial pulses.  No lower extremity  edema.   ASSESSMENT:  1.  Abnormal Cardiolite with showing inferior ischemia.  2.  Multiple cardiac risk factors.  3.  Hypertension, newly on Avapro 150 mg every day.  4.  Tobacco use for about 45 years.  5.  History of hyperlipidemia, mild.  6.  Positive premature family history for heart disease.  7.  Recent polypectomy on October 19, 2004.  8.   Bilateral femoral bruits with symptoms of leg fatigue and claudication      on the treadmill.  9.  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   PLAN:  Risks were explained, 1% for death, MI, stroke or need for urgent  bypass with cardiac catheterization and 2-3% of death, MI or stroke with PC  urgent bypass.  Procedure explained.  The patient willing to proceed.  Also  discuss hematoma, bruise and pseudoaneurysm as complications.  His wife and  mother-in-law were present at the time of this interview.  He was given an  aspirin in the same day area.  His labs for abnormals revealed elevated WBC  of 11.8.  His BUN and creatinine were normal as well as his potassium.  PT  and PTT were within normal limits.      BB/MEDQ  D:  11/14/2004  T:  11/14/2004  Job:  [id number]   cc:   Valetta Mole. Swords, M.D. [facility]

## 2010-12-23 NOTE — Op Note (Signed)
NAME:  Brett [name], Brett [name] NO.:  [id number]   MEDICAL RECORD NO.:  [id number]          PATIENT TYPE:  INP   LOCATION:  2310                         FACILITY:  [facility]   PHYSICIAN:  Salvatore Decent. Cornelius Moras, M.D. DATE OF BIRTH:  01-31-1941   DATE OF PROCEDURE:  12/07/2004  DATE OF DISCHARGE:                                 OPERATIVE REPORT   PREOPERATIVE DIAGNOSIS:  Severe three-vessel coronary artery disease.   POSTOPERATIVE DIAGNOSIS:  Severe three-vessel coronary artery disease.   PROCEDURE:  Median sternotomy for coronary artery bypass grafting times five  (left internal mammary artery to distal left anterior descending coronary  artery, saphenous vein graft to second diagonal branch, saphenous vein graft  to third circumflex marginal branch with sequential saphenous vein graft to  fourth circumflex marginal branch, saphenous vein graft to posterior  descending coronary artery, endoscopic saphenous vein harvest from right  thigh and right lower leg).   SURGEON:  Salvatore Decent. Cornelius Moras, M.D.   ASSISTANT:  Lujean Rave. P.A.   ANESTHESIA:  General.   BRIEF CLINICAL NOTE:  The patient is a 70 year old gentleman followed by Dr.  [name] with history of hypertension, hypercholesterolemia,  longstanding tobacco abuse, and a family history of coronary artery disease.  The patient was recently noted to have an abnormal electrocardiogram, and  this prompted a stress Cardiolite exam.  This was performed April4 and  demonstrated abnormal findings suspicious for coronary ischemia.  The  patient subsequently underwent elective cardiac catheterization by Dr.  Nanetta Batty on April10,2006.  Findings at the time of catheterization  include severe three-vessel coronary artery disease with normal left  ventricular function.  A full consultation note has been dictated  previously.   OPERATIVE CONSENT:  The patient and his wife have been counseled at length  regarding the  indications and potential benefits of coronary artery bypass  grafting.  Alternative treatment strategies have been discussed.  They  understand and accept all associated risks of surgery and desire to proceed  as described.   OPERATIVE FINDINGS:  1.  Normal left ventricular function.  2.  Moderate to severe atherosclerotic plaque involving the ascending      thoracic aorta.  3.  Diffuse coronary artery disease with relatively poor targets for bypass      grafting.  4.  Good-quality conduit for bypass grafting.   OPERATIVE NOTE IN DETAIL:  The patient was brought to the operating room on  the above-mentioned date and central monitoring was established by the  anesthesia service under the care and direction of Dr. Kipp Brood.  Specifically, a Swan-Ganz catheter is placed through the right internal  jugular approach.  A radial arterial line is placed.  Intravenous antibiotics are administered.  Following induction with general  endotracheal anesthesia, a Foley catheter is placed.  The patient's chest,  abdomen, both groins, and both lower extremities are prepared and draped in  a sterile manner.   Baseline transesophageal echocardiogram is performed by Dr. Noreene Larsson.  This  demonstrates normal left ventricular systolic function.  There is mild  aortic insufficiency.  There is trace mitral  regurgitation.  There is significant atherosclerotic plaque involving the proximal portion  of the ascending thoracic aorta.  No other abnormalities are noted.   A median sternotomy incision is performed and the left internal mammary  artery is dissected from the chest wall and prepared for bypass grafting.  The left internal mammary artery is a good-quality conduit.  Simultaneously  saphenous vein is obtained from the patient's right thigh and the upper  portion of the right lower leg using endoscopic vein harvest technique.  The  saphenous vein is slightly small-caliber but otherwise good-quality  conduit.  After the saphenous vein is removed, the small surgical incisions in the  right lower extremity are closed in multiple layers with running absorbable  suture.  The patient is heparinized systemically.   The pericardium is opened.  The ascending aorta is moderately diseased with  atherosclerosis.  An appropriate site for cannulation is chosen high in the  ascending aorta at the level of the innominate artery.  The aorta is  cannulated uneventfully.  A two-stage venous cannula is placed in the right  atrium.  Cardiopulmonary bypass is begun.  The surface of the heart is inspected.  There is diffuse coronary artery disease with diffuse palpable and visible  plaque in all of the epicardial coronary arteries.  Distal sites are  selected for coronary bypass grafting.  Portions of saphenous vein and the  left internal mammary artery are trimmed to appropriate lengths.  A  temperature probe is placed in the left ventricular septum.  A cardioplegia  catheter is placed in the ascending aorta.   The patient is allowed to cool passively to 32 degrees' systemic  temperature.  The aortic crossclamp  is applied and cardioplegia is delivered in an antegrade fashion through the  aortic root.  Iced saline slush is applied for topical hypothermia.  The  initial cardioplegic arrest and myocardial cooling are felt to be excellent.  Repeat doses of cardioplegia are administered intermittently throughout the  crossclamp portion of the operation through the aortic root and down the  subsequently-placed vein graft to maintain septal temperature below 15  degrees centigrade.   The following distal coronary anastomoses are performed:  1.  The posterior descending coronary artery is grafted with a saphenous      vein graft in an end-to-side fashion.  This vessel measures 1.3 mm at      the site of distal bypass.  It is diffusely diseased and a poor-quality     target.  2.  The third circumflex  marginal branch is grafted with a saphenous vein      graft in a side-to-side fashion.  This coronary measures 1.5 mm in diameter and is a fair-quality target.  It  is diffusely diseased, but a 1.5 probe will pass in both directions.  1.  The fourth circumflex marginal branch is grafted using a sequential      saphenous vein graft off of the vein placed to the third circumflex      marginal branch.  This coronary measures 1.5 mm in diameter and is of      fair quality.  It is also diffusely diseased, but a 1.5 probe will pass      in both directions.  2.  The second diagonal branch off the left anterior descending coronary      artery is grafted with a saphenous vein graft in an end-to-side fashion.      This coronary measures 1.4 mm in diameter and  is a fair to poor-quality      target.  It is diffusely diseased.  3.  The distal left anterior descending coronary artery is grafted with left      internal mammary artery in an end-to-side fashion.  This coronary      measures 1.8 mm in diameter and is a fair-quality target.  It is      diffusely diseased but a better-quality target and the other vessels.      and a 1.5 probe will pass in both direction easily.   All three proximal saphenous vein anastomoses are performed directly to the  ascending aorta prior to removal of the aortic crossclamp.  All air is  evacuated through the aortic root.  The left ventricular septal temperature  rises rapidly with reperfusion of the left internal mammary artery graft.  The aortic crossclamp is removed after a total crossclamp time of 77  minutes.   The heart begins to beat spontaneously without need for cardioversion.  All  proximal and distal coronary anastomoses are inspected for hemostasis and  appropriate graft orientation.  Epicardial pacing wires are affixed right  ventricular outflow tract and to the right atrial appendage.  The patient is  rewarmed to 37 degrees centigrade temperature.  The  patient is weaned from  cardiopulmonary bypass without difficulty.  The patient's rhythm at  separation from bypass is normal sinus rhythm.  No inotropic support is  required.  Total cardiopulmonary bypass time the operation is 94 minutes.   Follow-up transesophageal echocardiogram performed by Dr. Noreene Larsson after  separation from bypass demonstrates normal left ventricular function.  No  other abnormalities are noted.   The venous and arterial cannulae are removed uneventfully.  Protamine is  administered to reverse the anticoagulation.  The mediastinum and the left  chest are irrigated with saline solution containing vancomycin.  Meticulous  surgical hemostasis is ascertained.  The mediastinum and the left chest are  drained with three chest tubes exited through separate stab incisions  inferiorly.   The On-Q continuous pain management delivery system is utilized for postoperative analgesia.  Two 10-inch Silastic catheters supplied with On-Q  device are placed through separate stab incisions along the costal margin  and oriented in the deep subcutaneous tissues immediately anterior to the  costal cartilages just lateral to the lateral border of the sternum on  either side.  Each of these catheters are secured to the skin and flushed  with a 5 mL of 0.5% bupivacaine solution.  The catheters are then  subsequently connected to the continuous delivery pump.   The median sternotomy is closed in routine fashion with single-strength  sternal wire.  The soft tissues anterior to the sternum are closed in  multiple layers and the skin is closed with running subcuticular skin  closure.   The patient tolerated the procedure well and is transported to the surgical  intensive care unit in stable condition.  There are no intraoperative  complications.  All sponge, instrument and needle counts are verified  correct at completion of the operation. No blood products were administered.       CHO/MEDQ  D:  12/07/2004  T:  12/07/2004  Job:  [id number]   cc:   Macarthur Critchley. Shelva Majestic, M.D.  [address] [place]  [address]  [place]  [place] [address]  Fax: [phone]   Nanetta Batty, M.D.  Fax: [phone]   Valetta Mole. Swords, M.D. [facility]

## 2010-12-23 NOTE — Progress Notes (Signed)
[facility]                          PERIPHERAL VASCULAR OFFICE NOTE   NAME:FERRARAMase, Dhondt                      MRN:          [id number]  DATE:04/06/2006                            DOB:          [birth year]    PRIMARY CARE PHYSICIAN:  [name], M.D.   HISTORY OF PRESENT ILLNESS:  Mr. Bulthuis is a 70 year old gentleman with  atherosclerotic coronary and peripheral arterial disease.  He is status post  coronary artery bypass grafting in May 2006.  Ejection fraction is  preserved.  He also has lower extremity vascular disease.  He has several  years of left calf claudication.  It is currently bothering him at walking  about a 1/2 a mile of level ground.  He does not find it significantly  lifestyle limiting.  Based on duplex  performed in January of this year, it  appears that his symptomatic stenosis is of the proximal popliteal artery.  He has certainly had no rest pain or tissue loss.   CURRENT MEDICATIONS:  1. Enteric-coated aspirin 325 mg per day.  2. Multivitamin.  3. Vitamin C.  4. Calcium.  5. Niacin 500 mg per day.  6. Avapro 300 mg per day.  7. Lipitor 80 mg per day.  8. Pletal 100 mg twice per day.  9. Toprol XL 50 mg per day.   PHYSICAL EXAMINATION:  GENERAL:  He is generally well-appearing in no  distress.  VITAL SIGNS:  Heart rate 82.  Blood pressure 130/78 and equal bilaterally.  Weight is 181 pounds.  NECK:  He has no jugular venous distention, thyromegaly, or lymphadenopathy.  LUNGS:  Clear to auscultation.  Respiratory effort is normal.  CARDIAC:  He has a nondisplaced point of maximal cardiac impulse.  There is  a regular rate and rhythm.  There is a 2/6 systolic ejection murmur at the  right upper sternal border, which radiates to the neck.  ABDOMEN:  Soft, nondistended, nontender.  There is no hepatosplenomegaly and  no pulsatile midline mass.  No abdominal bruit.  EXTREMITIES:  Warm without clubbing, cyanosis,  edema, ulceration.  Carotid  pulses are 2+ bilaterally with bilateral bruits versus transmitted murmurs.  Femoral pulses are 1+ bilaterally without bruit.  Left popliteal, DP, and PT  are not palpable.  On the right, the popliteal is 1+ as are the DP and PT.   IMPRESSION/RECOMMENDATIONS:  1. Atherosclerotic coronary disease:  Asymptomatic.  No prior myocardial      infarction.  Ejection fraction preserved.  Continue aspirin and ARB as      secondary prevention.  2. Hypercholesterolemia:  Managed by Dr. Cato Mulligan.  Goal LDL less than 70,      HDL greater than 40.  3. Lower extremity peripheral arterial disease:  Minimally lifestyle-      limiting, left calf claudication.  Will continue conservative      management.  I have implored him to begin walking on a regular basis.      I think his wife will push him in this.  He is not sure that the Pletal      is  helping.  He will try stopping it and see if his symptoms remain      stable.   I will plan on seeing him back in a year.                                   Salvadore Farber, MD   WED/MedQ  DD:  04/06/2006  DT:  04/06/2006  Job #:  [id number]   cc:   Valetta Mole. Swords, MD

## 2010-12-23 NOTE — H&P (Signed)
NAME:  Brett [name], Brett [name] NO.:  [id number]   MEDICAL RECORD NO.:  [id number]          PATIENT TYPE:  INP   LOCATION:                               FACILITY:  [facility]   PHYSICIAN:  Salvatore Decent. Cornelius Moras, M.D. DATE OF BIRTH:  01/28/1941   DATE OF ADMISSION:  11/29/2004  DATE OF DISCHARGE:                                HISTORY & PHYSICAL   CHIEF COMPLAINT:  Abnormal stress test.   HISTORY OF PRESENT ILLNESS:  Brett [name] is a 70 year old retired male who  currently lives in [place], [place], who is followed by Dr. Cato Mulligan  with history of hypertension, mild hypercholesterolemia, longstanding  tobacco abuse, and a strong family history of coronary artery disease. Mr.  [name] apparently recently underwent a colonoscopy and was noted to have  irregular heart rhythm at that time. Electrocardiogram was performed and  apparently was abnormal. Subsequent to this, Dr. Cato Mulligan [name] Brett [name]  for an elective stress Cardiolite exam. This was performed on November 08, 2004.  The patient developed some shortness of breath and bilateral lower extremity  leg discomfort suggestive of claudication, although he did not develop any  chest pain during stress. He developed some equivocal ST-segment changes  across inferior leads, but stress imaging was notable for the presence of  reversible defect involving the inferior wall suggestive of myocardial  coronary ischemia. Resting ejection fraction was 62%. After evaluation by  Dr. Shelva [name], Brett [name] was subsequently [name] to Dr. Nanetta [name] for  elective cardiac catheterization. This was performed on November 14, 2004, and  findings at the time of catheterization are notable for the presence of  severe three-vessel coronary artery disease with normal left ventricular  function and coronary anatomy relatively unfavorable for percutaneous  coronary intervention. [name] has subsequently been [name] for  possible elective  surgical revascularization.   REVIEW OF SYSTEMS:  GENERAL: The patient reports feeling well overall. He  does note that he seems to get fatigued more easily than he used to, but  this has not been a sudden change but more slow and insidious. He reports  good appetite with no recent weight gain or weight loss. CARDIAC: The  patient specifically denies any substernal chest pain, chest tightness,  chest pressure either with activity or at rest. He has not had any  palpitations. He reports only moderate exertional shortness of breath and  this has not been problematic. He denies resting shortness of breath, PND,  orthopnea, or lower extremity edema. RESPIRATORY: Notable for morning cough  related to longstanding tobacco abuse. The patient claims he does not suffer  from recurrent bronchitis per se and he denies any recent history of  purulent sputum production or hemoptysis. GASTROINTESTINAL: Negative. The  patient has no difficulty swallowing. He denies ongoing problems with  reflux. He reports normal bowel function. He denies hematochezia,  hematemesis, melena. GENITOURINARY: Negative. The patient denies urinary  urgency or frequency. PERIPHERAL VASCULAR: Negative. The patient did develop  pain in both knees when he was walking on stress test but this does not  occur with routine physical activity at  all. NEUROLOGIC: Negative. The  patient denies recent transient visual disturbances. The patient denies  transient numbness or weakness involving either upper or lower extremity.  The patient denies history suggestive of seizures. PSYCHIATRIC: Negative.  HEENT: Negative.  The patient does wear eye glasses, but his vision is  stable.   PAST MEDICAL HISTORY:  1.  Hypertension.  2.  Borderline hypercholesterolemia.  3.  Remote history of duodenal ulcer.  4.  Longstanding tobacco abuse.   PAST SURGICAL HISTORY:  None.   FAMILY HISTORY:  The patient's father died of myocardial infarction at  age  46. The patient's paternal grandfather also died of myocardial infarction at  a young age.   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 He is married and  lives with his wife with their 47 year old daughter. The patient continues  to smoke one pack of cigarettes per day and has smoked for more than 40  years. At times he smoked quite heavily. The patient denies history of  significant alcohol consumption.   CURRENT MEDICATIONS:  1.  Avapro 150 mg daily.  2.  Toprol XL 25 mg daily.  3.  Wellbutrin SR 150 mg twice daily.  4.  Enteric-coated aspirin 81 mg daily.  5.  Lorazepam 1 mg twice daily as needed for anxiety.  All of these medications have been started within the last few weeks.   DRUG ALLERGIES:  None known.   PHYSICAL EXAM:  GENERAL APPEARANCE:  The patient is a well-appearing male  who appears his stated age in no acute distress.  VITAL SIGNS:  Blood pressure is slightly elevated at 180/80, pulse 88 and  regular. He is afebrile. Oxygen saturation 96% on room air.  HEENT: Exam is grossly unrevealing.  NECK:  Supple. There is no cervical or supraclavicular lymphadenopathy.  There is no jugular venous distension. No carotid bruits are noted.  CHEST:  Auscultation of the chest reveals clear breath sounds bilaterally.  No wheezes or rhonchi are noted. The patient does smell of cigarette smoke.  CARDIOVASCULAR: Exam includes regular rate and rhythm. No murmurs, rubs or  gallops noted.  ABDOMEN:  The abdomen is soft and nontender. There are no palpable masses.  Bowel sounds are present.  EXTREMITIES: Warm and well-perfused. There is no lower extremity edema.  Distal pulses are easily palpable in both lower legs at the ankle. There is  no sign of significant venous insufficiency. RECTAL AND GU:  Exams are both  deferred.  NEUROLOGIC:  Examination is grossly nonfocal symmetrical throughout.   DIAGNOSTIC TESTS:  Cardiac catheterization performed by Dr. Nanetta [name]  on April10, 2006, is reviewed. This demonstrates severe three-vessel  coronary artery disease with normal left ventricular function. Specifically,  there is 80% eccentric stenosis of the proximal left anterior descending  coronary artery arising at the takeoff of the large first septal perforating  branch. There is 70% proximal stenosis of a large first diagonal branch.  There is 80% proximal stenosis of the left circumflex coronary artery. There  is a very small first circumflex marginal branch. There is 80% proximal  stenosis of the large second circumflex marginal branch and 70-80% proximal  stenosis of the third circumflex marginal branch. There is 78% proximal  stenosis of the right coronary artery with 100% occlusion of the distal  right coronary artery after 90%  stenosis in the midportion. There is left-to-  right collateral filling of the posterior descending coronary artery. Left  ventricular function appears normal with ejection fraction estimated 55-60%.  There are no significant wall motion abnormalities.   IMPRESSION:  Severe three-vessel coronary artery disease with normal left  ventricular function. Mr. Cromartie remains essentially free of any symptoms,  but based upon his coronary anatomy, I believe he would benefit from  surgical revascularization in terms of decreased risk of future cardiac  event and prolonged long-term survival.   PLAN:  I have outlined options at length with Mr. Hartsell and his wife here  in the office today. Alternative treatment strategies have been discussed in  detail. They understand and accept all associated risks of surgery including  but not limited to risk of death, stroke, myocardial infarction, congestive  heart failure, respiratory failure, pneumonia, bleeding requiring blood   transfusion, arrhythmia, infection, and recurrent coronary artery disease.  He also understands the great importance associated with the fact that he  needs to find a way to quit smoking completely. All of his questions have  been addressed. We tentatively plan to proceed with surgery on Tuesday November 29, 2004.      CHO/MEDQ  D:  11/21/2004  T:  11/21/2004  Job:  [id number]   cc:   Macarthur Critchley. Brett [name], M.D.  [address] [place]  [address]  [place]  [place] [address]  Fax: [phone]   Brett [name], M.D.  Fax: [phone]   Valetta Mole. Swords, M.D. [facility]

## 2010-12-23 NOTE — Cardiovascular Report (Signed)
NAME:  ALICE, VITELLI NO.:  [id number]   MEDICAL RECORD NO.:  [id number]          PATIENT TYPE:  OIB   LOCATION:  2899                         FACILITY:  [facility]   PHYSICIAN:  Nanetta Batty, M.D.   DATE OF BIRTH:  1941-07-01   DATE OF PROCEDURE:  11/14/2004  DATE OF DISCHARGE:                              CARDIAC CATHETERIZATION   PROCEDURE:  Mr. Feltus is a 70 year old married white male, a patient of  Dr. Shirlee Latch with positive risk factors and a recent abnormal Cardiolite,  significant for inferior ischemia who was referred for a cardiac  catheterization to define his anatomy.   PROCEDURE:  Cardiac catheterization.   CARDIOLOGIST:  Nanetta Batty, M.D.   DESCRIPTION OF PROCEDURE:  The patient was brought to the second floor [facility]iac [facility]atheterization [facility] in the post-  absorptive state.  He was premedicated with p.o. Valium and IV Versed.  His  right groin was prepped and shaped in the usual sterile fashion.  Xylocaine  1% was used for local anesthesia.  A 6-French sheath was inserted into the  right femoral artery using the standard Seldinger technique.  The 6-French  right and left Judkins diagnostic catheters as well as a 6-French pigtail  catheter were used for selective coronary angiography, a left  ventriculography, sub-selective left internal mammary artery angiography and  distal abdominal aortography.  Visipaque dye was used for the entirety of  the case.  Retrograde aortic, left ventricular and pullback pressures were  recorded.   HEMODYNAMICS:  Aortic systolic pressure:  190.  Diastolic pressure:  78.  Left ventricular systolic pressure:  201.  Diastolic pressure:  15.   SELECTIVE CORONARY ANGIOGRAPHY:  1.  LEFT MAIN CORONARY ARTERY:  The left main coronary artery is normal.  2.  LEFT ANTERIOR DESCENDING CORONARY ARTERY:  The left anterior descending      coronary artery had a segmental 90% calcified lesion  proximally at the      takeoff of the first septal perforator which was a large septal as well      as decreased diagonal branch which was a medium-sized vessel.  There was      a 50%-60% segmental lesion in the mid-portion of the LAD.  The first      diagonal branch had 75%-80% segmental proximal stenosis.  3.  LEFT CIRCUMFLEX CORONARY ARTERY:  The left circumflex coronary artery      had 80% eccentric proximal, followed by 80% and 90% tandem lesions in      the distal OM.  The first OM branch had 80% and 90% proximal and mid-      stenosis.  4.  RIGHT CORONARY ARTERY:  The right coronary artery had a 75% segmental      proximal, a 90% segmental mid, followed by a total occlusion at the      genu.  There were grade 2 left to right collaterals.   LEFT VENTRICULOGRAPHY:  The RAO left ventriculogram was performed using 25  mL of Visipaque dye at 12 mL per second.  The overall LVF was estimated  at  greater than 60% without focal wall motion abnormalities.   LEFT INTERNAL MAMMARY ARTERY:  This vessel was sub-selectively visualized  and was widely patent.  It is suitable for use during coronary artery bypass  graft surgery.  There was incidentally noted insignificant 30% segmental  proximal left subclavian artery stenosis.   DISTAL ABDOMINAL AORTOGRAPHY:  The distal abdominal aortogram  was performed  using 20 mL of  Visipaque dye at 20 mL per second x2.  The renal arteries  are widely patent.  The infra-renal abdominal aorta is free of  atherosclerotic changes.  There were 40%-50% proximal/ostial common iliac  artery stenoses bilaterally; however, the patient does not have  claudication.   IMPRESSION:  Mr. Doristine Locks has three-vessel disease, preserved left  ventricular function and a positive Cardiolite stress test.  He does have  coronary calcification by fluoroscopy.  I believe that the best therapy  would be a coronary artery bypass grafting.   The sheaths were removed and pressure was  held on the groin to achieve  hemostasis.  The patient left the laboratory in stable condition.   DISPOSITION:  He will be discharged later today as an outpatient.  He will  follow up with  Kirke Shaggy, M.D., who was notified of these results and  will arrange for the patient to see a cardiothoracic surgeon some time this  week for a consultation.      JB/MEDQ  D:  11/14/2004  T:  11/14/2004  Job:  [id number]   cc:   Cardiac Cath Lab - [facility]   [facility]   Kirke Shaggy, M.D.  [place], [place]

## 2010-12-23 NOTE — H&P (Signed)
NAME:  Brett [name], Brett [name] NO.:  [id number]   MEDICAL RECORD NO.:  [id number]          PATIENT TYPE:  INP   LOCATION:  2310                         FACILITY:  [facility]   PHYSICIAN:  Guadalupe Maple, M.D.  DATE OF BIRTH:  02-23-1941   DATE OF ADMISSION:  12/07/2004  DATE OF DISCHARGE:                                HISTORY & PHYSICAL   PROCEDURE:  Intraoperative transesophageal echocardiography.   Brett [name] is a 70 year old white male with a history of three-vessel  coronary artery disease, who is scheduled to undergo coronary artery bypass  grafting by Dr.  Cornelius Moras.  Intraoperative transesophageal echocardiography was  requested to evaluate the left ventricular function and to determine if any  valvular pathology was present.   The patient was brought into the operating room at [facility] and  general anesthesia was induced without difficulty.  The trachea was  intubated without difficulty.  The transesophageal echocardiography probe  was then inserted into the esophagus without difficulty.   IMPRESSION:  Pre-bypass findings:  1.  Aortic valve:  The aortic leaflets were thickened, and there was      calcification in the base of the noncoronary cusp of the aortic valve,      but the valve appeared to open normally.  There was trace aortic      insufficiency present.  2.  Mitral valve:  The mitral valve leaflets were thickened but coapted      normally without prolapse or fluttering.  There was trace mitral      insufficiency.  3.  Left ventricle:  There was mild left ventricular hypertrophy.  The left      ventricular wall thickness measured 1.29 cm at end-diastole in the      midpapillary level of the anterior wall.  There was good contractility      at all segments interrogated.  Ejection fraction was estimated at 60%.      No wall motion abnormalities could be appreciated.  4.  Right ventricle:  The right ventricular function appeared normal.  There      was normal size of the right ventricle with good contractility of the      right ventricular free wall and good incursion of the lateral aspect of      the tricuspid annulus.  5.  Tricuspid valve:  The tricuspid leaflets were not well-visualized due to      poor image quality, but it appeared there was trace tricuspid      insufficiency present by color Doppler.  6.  Interatrial septum.  There was moderate lipomatous hypertrophy of the      interatrial septum, but the interatrial septum was intact without      evidence of patent foramen ovale or atrial septal defect.  7.  Left atrium:  Left atrial size appeared within normal limits.  There was      no thrombus noted in the left atrium or the left atrial appendage.  8.  Ascending aorta:  The ascending aorta appeared thickened diffusely, and      there was calcification  noted at the area of the sinotubular ridge      anteriorly.  9.  Descending aorta:  The descending aorta showed moderate atheromatous      disease and measured 2.62 cm in diameter.   Post-bypass findings:  1.  Left ventricle:  Left ventricular function appeared normal.  The      ejection fraction again was estimated at 60%.  There was good      contractility in all segments interrogated.  2.  Mitral valve:  There was trace mitral insufficiency, and the valve      appeared unchanged from the pre-bypass study.  3.  Aortic valve:  The aortic valve appeared unchanged from the pre-bypass      study.  There was trace aortic insufficiency.  4.  Right ventricle:  The right ventricular function appeared normal and      unchanged from the pre-bypass study.      DCJ/MEDQ  D:  12/07/2004  T:  12/07/2004  Job:  [id number]

## 2011-05-02 ENCOUNTER — Other Ambulatory Visit: Payer: Self-pay | Admitting: Internal Medicine

## 2011-08-15 ENCOUNTER — Encounter: Payer: Self-pay | Admitting: Cardiovascular Disease

## 2011-08-15 ENCOUNTER — Ambulatory Visit (INDEPENDENT_AMBULATORY_CARE_PROVIDER_SITE_OTHER): Payer: Medicare Other | Admitting: Cardiovascular Disease

## 2011-08-15 DIAGNOSIS — R011 Cardiac murmur, unspecified: Secondary | ICD-10-CM | POA: Diagnosis not present

## 2011-08-15 DIAGNOSIS — I6529 Occlusion and stenosis of unspecified carotid artery: Secondary | ICD-10-CM

## 2011-08-15 DIAGNOSIS — I739 Peripheral vascular disease, unspecified: Secondary | ICD-10-CM

## 2011-08-15 DIAGNOSIS — E785 Hyperlipidemia, unspecified: Secondary | ICD-10-CM

## 2011-08-15 DIAGNOSIS — I350 Nonrheumatic aortic (valve) stenosis: Secondary | ICD-10-CM | POA: Insufficient documentation

## 2011-08-15 DIAGNOSIS — I2510 Atherosclerotic heart disease of native coronary artery without angina pectoris: Secondary | ICD-10-CM

## 2011-08-15 DIAGNOSIS — I25810 Atherosclerosis of coronary artery bypass graft(s) without angina pectoris: Secondary | ICD-10-CM

## 2011-08-15 DIAGNOSIS — I10 Essential (primary) hypertension: Secondary | ICD-10-CM

## 2011-08-15 NOTE — Assessment & Plan Note (Signed)
Blood pressure is well-controlled on multidrug antihypertensive therapy.

## 2011-08-15 NOTE — Progress Notes (Signed)
HPI:  71 year old gentleman presenting for followup of coronary and peripheral arterial disease. The patient underwent multivessel coronary bypass surgery in 2006. 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 His last ABI study in February 2012 showed an ABI of 0.99 on the right and 0.79 on the left. The patient has carotid stenosis with 60-79% stenosis of the left internal carotid artery and less than 40% stenosis of the right internal carotid artery.  The patient is physically active but not engaged in regular exercise. He denies chest pain or pressure. He denies dyspnea, orthopnea, or PND. He has no palpitations. He denies leg pain with ambulation or at rest. He does complain of a flushing sensation in the evenings with associated itching. He has no other complaints today.  Outpatient Encounter Prescriptions as of 08/15/2011  Medication Sig Dispense Refill  . Ascorbic Acid (VITAMIN C) 500 MG tablet Take 500 mg by mouth daily.        Marland Kitchen aspirin 325 MG EC tablet Take 325 mg by mouth daily.        Marland Kitchen atorvastatin (LIPITOR) 80 MG tablet TAKE 1 TABLET DAILY  90 tablet  1  . Calcium Carbonate-Vitamin D (CALTRATE 600+D) 600-400 MG-UNIT per tablet Take 1 tablet by mouth daily.        . cilostazol (PLETAL) 100 MG tablet TAKE 1 TABLET TWICE A DAY  180 tablet  1  . hydrochlorothiazide (HYDRODIURIL) 12.5 MG tablet TAKE 1 TABLET DAILY  90 tablet  3  . HYDROCHLOROTHIAZIDE PO Take 12.5 mg by mouth daily.        . irbesartan (AVAPRO) 300 MG tablet TAKE 1 TABLET ONCE DAILY  90 tablet  3  . metoprolol (LOPRESSOR) 50 MG tablet Take 50 mg by mouth daily.        . metoprolol (TOPROL-XL) 50 MG 24 hr tablet TAKE 1 TABLET DAILY  90 tablet  3  . Multiple Vitamins-Minerals (DIASENSE MULTIVITAMIN) TABS Take 1 tablet by mouth.        . niacin 500 MG tablet Take 500 mg by mouth daily. Two by mouth daily       . NIASPAN 1000 MG CR tablet TAKE 1 TABLET ONCE DAILY  90  each  3  . nitroGLYCERIN (NITROSTAT) 0.3 MG SL tablet Place 0.3 mg under the tongue as needed. One under tongue as needed chest pain         No Known Allergies  Past Medical History  Diagnosis Date  . CAD (coronary artery disease) of artery bypass graft   . Hypertension   . Hyperlipidemia   . PVD (peripheral vascular disease)   . Carotid stenosis   . Hyperglycemia     ROS: Negative except as per HPI  BP 111/61  Pulse 85  Ht 5\' 6"  (1.676 m)  Wt 73.029 kg (161 lb)  BMI 25.99 kg/m2  PHYSICAL EXAM: Pt is alert and oriented, NAD HEENT: normal Neck: JVP - normal, carotids 2+= with bilateral bruits Lungs: CTA bilaterally CV: RRR with a grade 2/6 crescendo decrescendo murmur at the right upper sternal border Abd: soft, NT, Positive BS, no hepatomegaly Ext: no C/C/E Skin: warm/dry no rash  EKG:  Normal sinus rhythm 85 beats per minute, incomplete right bundle branch block  ASSESSMENT AND PLAN:

## 2011-08-15 NOTE — Assessment & Plan Note (Signed)
The patient is stable without anginal symptoms. He is on appropriate medical therapy with aspirin for antiplatelet therapy, atorvastatin for lipid lowering, and a beta blocker. He'll continue his current program without changes.

## 2011-08-15 NOTE — Patient Instructions (Signed)
Your physician wants you to follow-up in: 12 months.  You will receive a reminder letter in the mail two months in advance. If you don't receive a letter, please call our office to schedule the follow-up appointment.  Your physician has requested that you have a carotid duplex. This test is an ultrasound of the carotid arteries in your neck. It looks at blood flow through these arteries that supply the brain with blood. Allow one hour for this exam. There are no restrictions or special instructions.  Your physician has requested that you have an echocardiogram. Echocardiography is a painless test that uses sound waves to create images of your heart. It provides your doctor with information about the size and shape of your heart and how well your heart's chambers and valves are working. This procedure takes approximately one hour. There are no restrictions for this procedure.   

## 2011-08-15 NOTE — Assessment & Plan Note (Signed)
The patient has both short-acting niacin and long-acting Niaspan on his medication list. He is also on high-dose atorvastatin. His lipids were reviewed and he has low HDL cholesterol with good LDL. He is having flushing at nighttime and this is when he takes a short-acting niacin. I recommended that he discontinue this drug and continue on long-acting Niaspan along with his Lipitor.

## 2011-08-15 NOTE — Assessment & Plan Note (Signed)
The patient is stable without symptoms of claudication. His most recent ABIs were reviewed. We'll continue observation and medical therapy. He is on Pletal twice daily. We discussed the adverse effects of tobacco and he understands this. He has received counseling from both me and Dr. Cato Mulligan on numerous occasions.

## 2011-08-15 NOTE — Assessment & Plan Note (Signed)
The patient has a murmur suspicious for aortic stenosis. His aortic closure sound is intact and I suspect he may have mild aortic stenosis. I recommended an echocardiogram to further evaluate.

## 2011-08-15 NOTE — Assessment & Plan Note (Signed)
The patient has moderate, asymptomatic carotid stenosis. He will continue with regular carotid ultrasound surveillance. His last carotid duplex scan was reviewed as per the history of present illness.

## 2011-10-16 ENCOUNTER — Encounter (INDEPENDENT_AMBULATORY_CARE_PROVIDER_SITE_OTHER): Payer: Medicare Other

## 2011-10-16 ENCOUNTER — Encounter: Payer: Medicare Other | Admitting: *Deleted

## 2011-10-16 DIAGNOSIS — I6529 Occlusion and stenosis of unspecified carotid artery: Secondary | ICD-10-CM | POA: Diagnosis not present

## 2011-10-17 ENCOUNTER — Other Ambulatory Visit: Payer: Self-pay

## 2011-10-17 ENCOUNTER — Ambulatory Visit (HOSPITAL_COMMUNITY): Payer: Medicare Other | Attending: Cardiovascular Disease

## 2011-10-17 DIAGNOSIS — I379 Nonrheumatic pulmonary valve disorder, unspecified: Secondary | ICD-10-CM | POA: Insufficient documentation

## 2011-10-17 DIAGNOSIS — R011 Cardiac murmur, unspecified: Secondary | ICD-10-CM | POA: Insufficient documentation

## 2011-10-17 DIAGNOSIS — I080 Rheumatic disorders of both mitral and aortic valves: Secondary | ICD-10-CM | POA: Insufficient documentation

## 2011-10-17 DIAGNOSIS — I2510 Atherosclerotic heart disease of native coronary artery without angina pectoris: Secondary | ICD-10-CM | POA: Insufficient documentation

## 2011-10-17 DIAGNOSIS — I079 Rheumatic tricuspid valve disease, unspecified: Secondary | ICD-10-CM | POA: Diagnosis not present

## 2011-11-08 DIAGNOSIS — H2510 Age-related nuclear cataract, unspecified eye: Secondary | ICD-10-CM | POA: Diagnosis not present

## 2011-11-13 ENCOUNTER — Telehealth: Payer: Self-pay | Admitting: Internal Medicine

## 2011-11-13 MED ORDER — NITROGLYCERIN 0.3 MG SL SUBL: 0.3000 mg | SUBLINGUAL_TABLET | SUBLINGUAL | Status: DC | PRN

## 2011-11-13 MED ORDER — CILOSTAZOL 100 MG PO TABS: 100.0000 mg | ORAL_TABLET | Freq: Two times a day (BID) | ORAL | Status: DC

## 2011-11-13 NOTE — Telephone Encounter (Signed)
rx sent in electronically 

## 2011-11-13 NOTE — Telephone Encounter (Signed)
Pt is schedule to see Dr. Cato Mulligan 5/6 and is requesting a refill on cilostazol (PLETAL) 100 MG tablet  And nitroGLYCERIN (NITROSTAT) 0.3 MG SL tablet   [facility]

## 2011-11-27 ENCOUNTER — Other Ambulatory Visit: Payer: Self-pay | Admitting: Internal Medicine

## 2011-11-27 MED ORDER — ATORVASTATIN CALCIUM 80 MG PO TABS: 80.0000 mg | ORAL_TABLET | Freq: Every day | ORAL | Status: DC

## 2011-11-27 NOTE — Telephone Encounter (Signed)
Pt needs generic lipitor 80 mg#90 with 3 refills sent to [facility]. Pt has ov sch for 12-11-2011

## 2011-11-27 NOTE — Telephone Encounter (Signed)
3 mth supply sent in electronically 

## 2011-12-11 ENCOUNTER — Ambulatory Visit (INDEPENDENT_AMBULATORY_CARE_PROVIDER_SITE_OTHER): Payer: Medicare Other | Admitting: Internal Medicine

## 2011-12-11 ENCOUNTER — Encounter: Payer: Self-pay | Admitting: Internal Medicine

## 2011-12-11 VITALS — BP 100/60 | Temp 98.6°F | Wt 156.0 lb

## 2011-12-11 DIAGNOSIS — I25810 Atherosclerosis of coronary artery bypass graft(s) without angina pectoris: Secondary | ICD-10-CM | POA: Diagnosis not present

## 2011-12-11 DIAGNOSIS — E785 Hyperlipidemia, unspecified: Secondary | ICD-10-CM | POA: Diagnosis not present

## 2011-12-11 DIAGNOSIS — R7309 Other abnormal glucose: Secondary | ICD-10-CM | POA: Diagnosis not present

## 2011-12-11 DIAGNOSIS — I10 Essential (primary) hypertension: Secondary | ICD-10-CM

## 2011-12-11 LAB — CBC WITH DIFFERENTIAL/PLATELET
Basophils Absolute: 0 10*3/uL (ref 0.0–0.1)
Eosinophils Absolute: 0.1 10*3/uL (ref 0.0–0.7)
Hemoglobin: 15.3 g/dL (ref 13.0–17.0)
Lymphocytes Relative: 18 % (ref 12.0–46.0)
MCHC: 34.6 g/dL (ref 30.0–36.0)
Monocytes Absolute: 1 10*3/uL (ref 0.1–1.0)
Neutro Abs: 8 10*3/uL — ABNORMAL HIGH (ref 1.4–7.7)
RDW: 14 % (ref 11.5–14.6)

## 2011-12-11 LAB — BASIC METABOLIC PANEL
CO2: 26 mEq/L (ref 19–32)
Calcium: 9.6 mg/dL (ref 8.4–10.5)
Chloride: 105 mEq/L (ref 96–112)
Glucose, Bld: 95 mg/dL (ref 70–99)
Potassium: 5.1 mEq/L (ref 3.5–5.1)
Sodium: 142 mEq/L (ref 135–145)

## 2011-12-11 LAB — HEPATIC FUNCTION PANEL
ALT: 19 U/L (ref 0–53)
AST: 24 U/L (ref 0–37)
Alkaline Phosphatase: 76 U/L (ref 39–117)
Bilirubin, Direct: 0.1 mg/dL (ref 0.0–0.3)

## 2011-12-11 LAB — LIPID PANEL
Cholesterol: 99 mg/dL (ref 0–200)
LDL Cholesterol: 46 mg/dL (ref 0–99)
Triglycerides: 88 mg/dL (ref 0.0–149.0)
VLDL: 17.6 mg/dL (ref 0.0–40.0)

## 2011-12-11 MED ORDER — IRBESARTAN 300 MG PO TABS: 150.0000 mg | ORAL_TABLET | Freq: Every day | ORAL | Status: DC

## 2011-12-11 NOTE — Progress Notes (Signed)
Patient ID: Brett [name], male   DOB: 1940-09-12, 71 y.o.   MRN: [id number]  CAD-- no sxs  htn---tolerating meds  Lipids-- tolerating meds  Past Medical History  Diagnosis Date  . CAD (coronary artery disease) of artery bypass graft   . Hypertension   . Hyperlipidemia   . PVD (peripheral vascular disease)   . Carotid stenosis   . Hyperglycemia     History   Social History  . Marital Status: Married    Spouse Name: N/A    Number of Children: N/A  . Years of Education: N/A   Occupational History  . Not on file.   Social History Main Topics  . Smoking status: Current Everyday Smoker -- 1.0 packs/day for 40 years    Types: Cigarettes  . Smokeless tobacco: Never Used  . Alcohol Use: Yes     occasionaly  . Drug Use: Not on file  . Sexually Active: Not on file   Other Topics Concern  . Not on file   Social History Narrative  . No narrative on file    Past Surgical History  Procedure Date  . Coronary artery bypass graft 2006    Family History  Problem Relation Age of Onset  . Heart attack Father     No Known Allergies  Current Outpatient Prescriptions on File Prior to Visit  Medication Sig Dispense Refill  . Ascorbic Acid (VITAMIN C) 500 MG tablet Take 500 mg by mouth daily.        Marland Kitchen aspirin 325 MG EC tablet Take 325 mg by mouth daily.        Marland Kitchen atorvastatin (LIPITOR) 80 MG tablet Take 1 tablet (80 mg total) by mouth daily.  90 tablet  0  . Calcium Carbonate-Vitamin D (CALTRATE 600+D) 600-400 MG-UNIT per tablet Take 1 tablet by mouth daily.        . cilostazol (PLETAL) 100 MG tablet Take 1 tablet (100 mg total) by mouth 2 (two) times daily.  180 tablet  0  . hydrochlorothiazide (HYDRODIURIL) 12.5 MG tablet TAKE 1 TABLET DAILY  90 tablet  3  . HYDROCHLOROTHIAZIDE PO Take 12.5 mg by mouth daily.        . irbesartan (AVAPRO) 300 MG tablet TAKE 1 TABLET ONCE DAILY  90 tablet  3  . metoprolol (LOPRESSOR) 50 MG tablet Take 50 mg by mouth daily.        .  metoprolol (TOPROL-XL) 50 MG 24 hr tablet TAKE 1 TABLET DAILY  90 tablet  3  . Multiple Vitamins-Minerals (DIASENSE MULTIVITAMIN) TABS Take 1 tablet by mouth.        Marland Kitchen NIASPAN 1000 MG CR tablet TAKE 1 TABLET ONCE DAILY  90 each  3  . nitroGLYCERIN (NITROSTAT) 0.3 MG SL tablet Place 1 tablet (0.3 mg total) under the tongue as needed. One under tongue as needed chest pain  90 tablet  0     patient denies chest pain, shortness of breath, orthopnea. Denies lower extremity edema, abdominal pain, change in appetite, change in bowel movements. Patient denies rashes, musculoskeletal complaints. No other specific complaints in a complete review of systems.   BP 100/60  Temp(Src) 98.6 F (37 C) (Oral)  Wt 156 lb (70.761 kg)  well-developed well-nourished male in no acute distress. HEENT exam atraumatic, normocephalic, neck supple without jugular venous distention. Chest clear to auscultation cardiac exam S1-S2 are regular. Abdominal exam overweight with bowel sounds, soft and nontender. Extremities no edema. Neurologic exam is alert with  a normal gait.

## 2011-12-11 NOTE — Assessment & Plan Note (Signed)
Will check labs today

## 2011-12-11 NOTE — Assessment & Plan Note (Signed)
He has lost some weight and bp is low BP Readings from Last 3 Encounters:  12/11/11 100/60  08/15/11 111/61  09/16/10 134/76   Will decrease meds

## 2012-02-14 ENCOUNTER — Telehealth: Payer: Self-pay | Admitting: Internal Medicine

## 2012-02-14 MED ORDER — CILOSTAZOL 100 MG PO TABS: 100.0000 mg | ORAL_TABLET | Freq: Two times a day (BID) | ORAL | Status: DC

## 2012-02-14 MED ORDER — ATORVASTATIN CALCIUM 80 MG PO TABS: 80.0000 mg | ORAL_TABLET | Freq: Every day | ORAL | Status: DC

## 2012-02-14 NOTE — Telephone Encounter (Signed)
rx sent in electronically 

## 2012-02-14 NOTE — Telephone Encounter (Signed)
Patient's spouse called stating that the patient need a 90 day refill of his cilostazol tabs 100mg ,and atorvastatin 80 mg called into [facility]. Please assist and inform when done.

## 2012-03-06 ENCOUNTER — Telehealth: Payer: Self-pay | Admitting: Internal Medicine

## 2012-03-06 MED ORDER — IRBESARTAN 300 MG PO TABS: 150.0000 mg | ORAL_TABLET | Freq: Every day | ORAL | Status: DC

## 2012-03-06 NOTE — Telephone Encounter (Signed)
rx sent in electronically 

## 2012-03-06 NOTE — Telephone Encounter (Signed)
Pts spouse called re: pts irbesartan (AVAPRO). Pts spouse said that during pts last ov, Dr Cato Mulligan told pt to cut the 300mg  dose in half and start taking that 1 time a day. Need to get a new script written for irbesartan (AVAPRO)150mg  dose and call in to [facility] for 90 day supply.

## 2012-03-11 ENCOUNTER — Telehealth: Payer: Self-pay | Admitting: Internal Medicine

## 2012-03-11 NOTE — Telephone Encounter (Signed)
[facility] called re: irbesartan (AVAPRO) 300 MG tablet. Pill is not scored. Is it ok to do 150 mg tab once a day for #90 with 1 refill?

## 2012-03-11 NOTE — Telephone Encounter (Signed)
Yes

## 2012-03-12 MED ORDER — IRBESARTAN 150 MG PO TABS: 150.0000 mg | ORAL_TABLET | Freq: Every day | ORAL | Status: DC

## 2012-03-12 NOTE — Telephone Encounter (Signed)
[facility] called to check on status of changing pts script for Irbestartan to 150 mg once a day, since the 350 mg pill isn't scored. Pls call asap. Pts order will be held until a response is rcvd.

## 2012-03-12 NOTE — Telephone Encounter (Signed)
rx sent in electronically 

## 2012-04-18 ENCOUNTER — Other Ambulatory Visit: Payer: Self-pay | Admitting: Cardiology

## 2012-04-18 DIAGNOSIS — I6529 Occlusion and stenosis of unspecified carotid artery: Secondary | ICD-10-CM

## 2012-04-22 ENCOUNTER — Encounter (INDEPENDENT_AMBULATORY_CARE_PROVIDER_SITE_OTHER): Payer: Medicare Other

## 2012-04-22 DIAGNOSIS — I6529 Occlusion and stenosis of unspecified carotid artery: Secondary | ICD-10-CM | POA: Diagnosis not present

## 2012-04-22 DIAGNOSIS — R0989 Other specified symptoms and signs involving the circulatory and respiratory systems: Secondary | ICD-10-CM | POA: Diagnosis not present

## 2012-04-30 ENCOUNTER — Encounter: Payer: Self-pay | Admitting: Cardiovascular Disease

## 2012-04-30 NOTE — Telephone Encounter (Signed)
Pt rtn your call but he may not be able to answer because she drives the school bus but you can leave a message

## 2012-04-30 NOTE — Telephone Encounter (Signed)
This encounter was created in error - please disregard.

## 2012-05-12 DIAGNOSIS — Z23 Encounter for immunization: Secondary | ICD-10-CM | POA: Diagnosis not present

## 2012-05-15 ENCOUNTER — Other Ambulatory Visit: Payer: Self-pay | Admitting: *Deleted

## 2012-05-15 MED ORDER — METOPROLOL SUCCINATE ER 50 MG PO TB24: 50.0000 mg | ORAL_TABLET | Freq: Every day | ORAL | Status: DC

## 2012-05-22 ENCOUNTER — Telehealth: Payer: Self-pay | Admitting: Cardiovascular Disease

## 2012-05-22 NOTE — Telephone Encounter (Signed)
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

## 2012-05-29 NOTE — Telephone Encounter (Signed)
Pt has Called back asking For Status Of this paper, I will Staff Msg Lauren  05/29/12/Km

## 2012-06-11 ENCOUNTER — Other Ambulatory Visit: Payer: Self-pay | Admitting: Internal Medicine

## 2012-06-11 MED ORDER — NIACIN ER (ANTIHYPERLIPIDEMIC) 1000 MG PO TBCR: 1000.0000 mg | EXTENDED_RELEASE_TABLET | Freq: Every day | ORAL | Status: DC

## 2012-06-11 NOTE — Telephone Encounter (Signed)
Pt needs 90 day refill for Niaspan sent to [facility] [place], [place] - [address]  [phone] Pt has been out of Niaspan for 2 days and would like to request [facility] [address]  [phone]. To get him through until mail order comes.

## 2012-06-11 NOTE — Telephone Encounter (Signed)
10 day supply sent in electronically to [facility] and 90 day supply sent in to [facility]

## 2012-08-02 ENCOUNTER — Telehealth: Payer: Self-pay | Admitting: Internal Medicine

## 2012-08-02 MED ORDER — CILOSTAZOL 100 MG PO TABS: 100.0000 mg | ORAL_TABLET | Freq: Two times a day (BID) | ORAL | Status: DC

## 2012-08-02 MED ORDER — NIACIN ER (ANTIHYPERLIPIDEMIC) 1000 MG PO TBCR: 1000.0000 mg | EXTENDED_RELEASE_TABLET | Freq: Every day | ORAL | Status: DC

## 2012-08-02 NOTE — Telephone Encounter (Signed)
rx's sent in electronically 

## 2012-08-02 NOTE — Telephone Encounter (Signed)
Patient called stating that her husbands needs refills of his cilostazol (PLETAL) 100 MG tablet [[id number]]  Take 1 tablet (100 mg total) by mouth 2 (two) times daily, atorvastatin (LIPITOR) 80 MG tabletTake 1 tablet (80 mg total) by mouth daily and niacin (NIASPAN) 1000 MG CR tablet Take 1 tablet (1,000 mg total) by mouth at bedtime senmt to [facility] for a 90 day supply. Please assist.

## 2012-08-21 ENCOUNTER — Telehealth: Payer: Self-pay | Admitting: Internal Medicine

## 2012-08-21 MED ORDER — ATORVASTATIN CALCIUM 80 MG PO TABS: 80.0000 mg | ORAL_TABLET | Freq: Every day | ORAL | Status: DC

## 2012-08-21 NOTE — Telephone Encounter (Signed)
rx sent in electronically 

## 2012-08-21 NOTE — Telephone Encounter (Signed)
Patient's spouse called in stating that his atorvastatin (LIPITOR) 80 MG tabletTake 1 tablet (80 mg total) by mouth daily was never sent to [facility] and they need it faxed in so that this may be processed quickly fax # [phone]. Please assist as patient is now completely out and inform patient when done.

## 2012-10-17 ENCOUNTER — Encounter: Payer: Self-pay | Admitting: Cardiovascular Disease

## 2012-10-17 ENCOUNTER — Ambulatory Visit (INDEPENDENT_AMBULATORY_CARE_PROVIDER_SITE_OTHER): Payer: Medicare Other | Admitting: Cardiovascular Disease

## 2012-10-17 VITALS — BP 118/64 | HR 90 | Ht 66.0 in | Wt 151.0 lb

## 2012-10-17 DIAGNOSIS — I359 Nonrheumatic aortic valve disorder, unspecified: Secondary | ICD-10-CM | POA: Diagnosis not present

## 2012-10-17 DIAGNOSIS — I2510 Atherosclerotic heart disease of native coronary artery without angina pectoris: Secondary | ICD-10-CM

## 2012-10-17 NOTE — Progress Notes (Signed)
HPI:  72 year old gentleman presenting for followup evaluation. He was last seen about one year ago. He has coronary artery disease and underwent CABG in 2006. He also has left leg claudication and known popliteal stenosis. Last ABI was 0.99 on the right and 0.7 on the left. He has bilateral carotid disease with 60-79% stenosis on the left and 40-60% on the right. His last carotid study was in September 2013.  The patient feels well. He continues to smoke cigarettes. He's had no chest pain or shortness of breath. He denies leg pain with ambulation. He's had no leg swelling, orthopnea, or PND.  Outpatient Encounter Prescriptions as of 10/17/2012  Medication Sig Dispense Refill  . Ascorbic Acid (VITAMIN C) 500 MG tablet Take 500 mg by mouth daily.        Marland Kitchen aspirin 325 MG EC tablet Take 325 mg by mouth daily.        Marland Kitchen atorvastatin (LIPITOR) 80 MG tablet Take 1 tablet (80 mg total) by mouth daily.  90 tablet  1  . Calcium Carbonate-Vitamin D (CALTRATE 600+D) 600-400 MG-UNIT per tablet Take 1 tablet by mouth daily.        . cilostazol (PLETAL) 100 MG tablet Take 1 tablet (100 mg total) by mouth 2 (two) times daily.  180 tablet  1  . irbesartan (AVAPRO) 150 MG tablet Take 1 tablet (150 mg total) by mouth at bedtime.  90 tablet  3  . metoprolol succinate (TOPROL-XL) 50 MG 24 hr tablet Take 1 tablet (50 mg total) by mouth daily. Take with or immediately following a meal.  90 tablet  3  . Multiple Vitamins-Minerals (DIASENSE MULTIVITAMIN) TABS Take 1 tablet by mouth.        . niacin (NIASPAN) 1000 MG CR tablet Take 1 tablet (1,000 mg total) by mouth at bedtime.  90 tablet  1  . nitroGLYCERIN (NITROSTAT) 0.3 MG SL tablet Place 1 tablet (0.3 mg total) under the tongue as needed. One under tongue as needed chest pain  90 tablet  0   No facility-administered encounter medications on file as of 10/17/2012.    No Known Allergies  Past Medical History  Diagnosis Date  . CAD (coronary artery disease) of  artery bypass graft   . Hypertension   . Hyperlipidemia   . PVD (peripheral vascular disease)   . Carotid stenosis   . Hyperglycemia     ROS: Negative except as per HPI  BP 118/64  Pulse 90  Ht 5\' 6"  (1.676 m)  Wt 68.493 kg (151 lb)  BMI 24.38 kg/m2  PHYSICAL EXAM: Pt is alert and oriented, NAD HEENT: normal Neck: JVP - normal, carotids 2+= with soft bilateral bruits Lungs: CTA bilaterally CV: RRR with grade 2/6 systolic murmur carotid upper sternal border Abd: soft, NT, Positive BS, no hepatomegaly Ext: no C/C/E Skin: warm/dry no rash  EKG:  Normal sinus rhythm 90 beats per minute, borderline criteria for LVH.  ASSESSMENT AND PLAN: 1. Coronary artery disease status post CABG. He remains stable without anginal symptoms. He will continue on his current medical regimen which includes aspirin, statin drug, and ARB, and a beta blocker. Prior to CABG, he had symptoms of burning in his chest. These have not recurred since surgery.  2. Lower extremity peripheral arterial disease with intermittent claudication. His symptoms have resolved. He is not limited and will continue on medical therapy.  3. Moderate aortic stenosis. He remains asymptomatic. Will repeat an echocardiogram next year which will be a  2 year interval from his previous study.  4. Tobacco. He was counseled regarding the need for tobacco cessation.  5. Carotid stenosis. No history of stroke or TIA. He will continue with surveillance carotid duplex ultrasonography as recommended.  Tonny Bollman 10/17/2012 2:38 PM

## 2012-10-17 NOTE — Patient Instructions (Addendum)
Your physician wants you to follow-up in 12 months. You will receive a reminder letter in the mail two months in advance. If you don't receive a letter, please call our office to schedule the follow-up appointment.  Your physician has requested that you have an echocardiogram. Echocardiography is a painless test that uses sound waves to create images of your heart. It provides your doctor with information about the size and shape of your heart and how well your heart's chambers and valves are working. This procedure takes approximately one hour. There are no restrictions for this procedure. To be done in 12 months--prior to appt with Dr. Excell Seltzer

## 2012-11-06 ENCOUNTER — Encounter (INDEPENDENT_AMBULATORY_CARE_PROVIDER_SITE_OTHER): Payer: Medicare Other

## 2012-11-06 DIAGNOSIS — I6529 Occlusion and stenosis of unspecified carotid artery: Secondary | ICD-10-CM | POA: Diagnosis not present

## 2012-11-27 ENCOUNTER — Encounter: Payer: Self-pay | Admitting: Internal Medicine

## 2012-11-27 ENCOUNTER — Ambulatory Visit (INDEPENDENT_AMBULATORY_CARE_PROVIDER_SITE_OTHER): Payer: Medicare Other | Admitting: Internal Medicine

## 2012-11-27 ENCOUNTER — Ambulatory Visit: Payer: Medicare Other | Admitting: Internal Medicine

## 2012-11-27 VITALS — BP 122/58 | HR 84 | Temp 97.7°F | Wt 148.0 lb

## 2012-11-27 DIAGNOSIS — I10 Essential (primary) hypertension: Secondary | ICD-10-CM

## 2012-11-27 DIAGNOSIS — I25810 Atherosclerosis of coronary artery bypass graft(s) without angina pectoris: Secondary | ICD-10-CM | POA: Diagnosis not present

## 2012-11-27 DIAGNOSIS — E785 Hyperlipidemia, unspecified: Secondary | ICD-10-CM

## 2012-11-27 DIAGNOSIS — R7309 Other abnormal glucose: Secondary | ICD-10-CM | POA: Diagnosis not present

## 2012-11-27 LAB — CBC WITH DIFFERENTIAL/PLATELET
Basophils Absolute: 0 10*3/uL (ref 0.0–0.1)
Basophils Relative: 0.2 % (ref 0.0–3.0)
Eosinophils Absolute: 0.1 10*3/uL (ref 0.0–0.7)
Hemoglobin: 15.1 g/dL (ref 13.0–17.0)
Lymphocytes Relative: 22.1 % (ref 12.0–46.0)
Lymphs Abs: 2.7 10*3/uL (ref 0.7–4.0)
MCHC: 35.2 g/dL (ref 30.0–36.0)
MCV: 96.7 fl (ref 78.0–100.0)
Monocytes Absolute: 0.8 10*3/uL (ref 0.1–1.0)
Neutro Abs: 8.6 10*3/uL — ABNORMAL HIGH (ref 1.4–7.7)
RBC: 4.45 Mil/uL (ref 4.22–5.81)
RDW: 13.8 % (ref 11.5–14.6)

## 2012-11-27 LAB — BASIC METABOLIC PANEL
BUN: 16 mg/dL (ref 6–23)
Calcium: 9.6 mg/dL (ref 8.4–10.5)
Chloride: 107 mEq/L (ref 96–112)
Creatinine, Ser: 1 mg/dL (ref 0.4–1.5)
GFR: 74.73 mL/min (ref >60.00)

## 2012-11-27 LAB — LIPID PANEL
Cholesterol: 106 mg/dL (ref 0–200)
LDL Cholesterol: 58 mg/dL (ref 0–99)
VLDL: 9.4 mg/dL (ref 0.0–40.0)

## 2012-11-27 LAB — HEPATIC FUNCTION PANEL
Alkaline Phosphatase: 76 U/L (ref 39–117)
Bilirubin, Direct: 0.1 mg/dL (ref 0.0–0.3)
Total Bilirubin: 0.8 mg/dL (ref 0.3–1.2)
Total Protein: 6.9 g/dL (ref 6.0–8.3)

## 2012-11-27 LAB — HEMOGLOBIN A1C: Hgb A1c MFr Bld: 5.6 % (ref 4.6–6.5)

## 2012-11-27 NOTE — Assessment & Plan Note (Signed)
He is on two meds and given his great bp here and weight loss i think we can d/c one Stop metoprolol

## 2012-11-27 NOTE — Assessment & Plan Note (Signed)
Note weight loss Congratulated He should maintain weight now

## 2012-11-27 NOTE — Assessment & Plan Note (Signed)
No sxs He has lost weight We may be able to minimize meds (bp, lipids, etc...)

## 2012-11-27 NOTE — Assessment & Plan Note (Signed)
Lipid Panel     Component Value Date/Time   CHOL 99 12/11/2011 1006   TRIG 88.0 12/11/2011 1006   HDL 35.50* 12/11/2011 1006   CHOLHDL 3 12/11/2011 1006   VLDL 17.6 12/11/2011 1006   LDLCALC 46 12/11/2011 1006

## 2012-11-28 NOTE — Progress Notes (Signed)
Patient ID: Brett [name], male   DOB: 01-Jan-1941, 72 y.o.   MRN: [id number]  CAD- no sxs, tolerating meds  htn- has lost weight, tolerating meds  Hyperglycemia- no sxs and has not required treatment  Reviewed pmh, psh, sochx, meds reviewed   patient denies chest pain, shortness of breath, orthopnea. Denies lower extremity edema, abdominal pain, change in appetite, change in bowel movements. Patient denies rashes, musculoskeletal complaints. No other specific complaints in a complete review of systems.    well-developed well-nourished male in no acute distress. HEENT exam atraumatic, normocephalic, neck supple without jugular venous distention. Chest clear to auscultation cardiac exam S1-S2 are regular. Abdominal exam overweight with bowel sounds, soft and nontender. Extremities no edema. Neurologic exam is alert with a normal gait.

## 2012-11-28 NOTE — Addendum Note (Signed)
Addended by: Alfred Levins D on: 11/28/2012 10:36 AM   Modules accepted: Orders

## 2012-11-29 ENCOUNTER — Ambulatory Visit: Payer: Medicare Other | Admitting: Internal Medicine

## 2013-01-08 ENCOUNTER — Telehealth: Payer: Self-pay | Admitting: Internal Medicine

## 2013-01-08 MED ORDER — ATORVASTATIN CALCIUM 80 MG PO TABS: 80.0000 mg | ORAL_TABLET | Freq: Every day | ORAL | Status: DC

## 2013-01-08 MED ORDER — CILOSTAZOL 100 MG PO TABS: 100.0000 mg | ORAL_TABLET | Freq: Two times a day (BID) | ORAL | Status: DC

## 2013-01-08 NOTE — Telephone Encounter (Signed)
rx's sent in electronically 

## 2013-01-08 NOTE — Telephone Encounter (Signed)
Pt needs refills on cilostazol 100 mg #180 and atorvastatin 80 mg #90 with refills sent to [facility]

## 2013-02-12 ENCOUNTER — Other Ambulatory Visit: Payer: Self-pay | Admitting: *Deleted

## 2013-02-12 MED ORDER — IRBESARTAN 150 MG PO TABS: 150.0000 mg | ORAL_TABLET | Freq: Every day | ORAL | Status: DC

## 2013-05-04 DIAGNOSIS — Z23 Encounter for immunization: Secondary | ICD-10-CM | POA: Diagnosis not present

## 2013-08-05 ENCOUNTER — Other Ambulatory Visit: Payer: Self-pay | Admitting: Internal Medicine

## 2013-08-12 ENCOUNTER — Telehealth: Payer: Self-pay | Admitting: Internal Medicine

## 2013-08-12 MED ORDER — NIACIN ER (ANTIHYPERLIPIDEMIC) 1000 MG PO TBCR: EXTENDED_RELEASE_TABLET | ORAL | Status: DC

## 2013-08-12 NOTE — Telephone Encounter (Signed)
Pt's wife calling on behalf of patient to state [facility] has informed them they have not received rx refill for niacin (NIASPAN) 1000 MG CR tablet and cilostazol (PLETAL) 100 MG tablet.  Informed wife refill was sent to pharmacy on 08/05/13.  Please follow-up with pharmacy regarding refill that was already sent as pt is running out of meds.

## 2013-08-12 NOTE — Telephone Encounter (Signed)
Faxed to pharmacy

## 2013-08-19 ENCOUNTER — Other Ambulatory Visit: Payer: Self-pay | Admitting: Internal Medicine

## 2013-11-04 ENCOUNTER — Ambulatory Visit (HOSPITAL_COMMUNITY): Payer: Medicare Other | Attending: Cardiology | Admitting: Radiology

## 2013-11-04 ENCOUNTER — Other Ambulatory Visit (HOSPITAL_COMMUNITY): Payer: Self-pay | Admitting: Cardiovascular Disease

## 2013-11-04 ENCOUNTER — Encounter: Payer: Self-pay | Admitting: Cardiology

## 2013-11-04 ENCOUNTER — Other Ambulatory Visit: Payer: Self-pay | Admitting: Internal Medicine

## 2013-11-04 ENCOUNTER — Other Ambulatory Visit (HOSPITAL_COMMUNITY): Payer: Self-pay | Admitting: Radiology

## 2013-11-04 DIAGNOSIS — I359 Nonrheumatic aortic valve disorder, unspecified: Secondary | ICD-10-CM

## 2013-11-04 DIAGNOSIS — R011 Cardiac murmur, unspecified: Secondary | ICD-10-CM | POA: Insufficient documentation

## 2013-11-04 NOTE — Progress Notes (Signed)
Echocardiogram performed.  

## 2013-11-11 DIAGNOSIS — H2510 Age-related nuclear cataract, unspecified eye: Secondary | ICD-10-CM | POA: Diagnosis not present

## 2013-11-11 DIAGNOSIS — Z0100 Encounter for examination of eyes and vision without abnormal findings: Secondary | ICD-10-CM | POA: Diagnosis not present

## 2013-11-19 ENCOUNTER — Telehealth: Payer: Self-pay | Admitting: Internal Medicine

## 2013-11-19 DIAGNOSIS — Z0279 Encounter for issue of other medical certificate: Secondary | ICD-10-CM

## 2013-11-19 MED ORDER — ATORVASTATIN CALCIUM 80 MG PO TABS: ORAL_TABLET | ORAL | Status: DC

## 2013-11-19 NOTE — Telephone Encounter (Signed)
Pt has appt sch 12-22-13. Pt wife is requesting a least 90 day supply

## 2013-11-19 NOTE — Telephone Encounter (Signed)
[facility] will pick up [facility]

## 2013-11-19 NOTE — Telephone Encounter (Signed)
Pt is wanting update on paperwork that was turned in on 11/10/13 for dr. Arnoldo Morale to fill out. A medical review form form [facility] that was due on 11/16/13.

## 2013-11-19 NOTE — Telephone Encounter (Signed)
Pt needs new rx atorvastatin 80 mg #90 with refills [facility] to [facility]

## 2013-11-19 NOTE — Telephone Encounter (Signed)
Pt needs an appt

## 2013-11-19 NOTE — Telephone Encounter (Signed)
rx sent in electronically 

## 2013-11-21 ENCOUNTER — Ambulatory Visit (INDEPENDENT_AMBULATORY_CARE_PROVIDER_SITE_OTHER): Payer: Medicare Other | Admitting: Cardiovascular Disease

## 2013-11-21 ENCOUNTER — Encounter: Payer: Self-pay | Admitting: Cardiovascular Disease

## 2013-11-21 VITALS — BP 130/62 | HR 102 | Ht 66.0 in | Wt 145.8 lb

## 2013-11-21 DIAGNOSIS — I25810 Atherosclerosis of coronary artery bypass graft(s) without angina pectoris: Secondary | ICD-10-CM

## 2013-11-21 DIAGNOSIS — I10 Essential (primary) hypertension: Secondary | ICD-10-CM | POA: Diagnosis not present

## 2013-11-21 NOTE — Progress Notes (Signed)
HPI:  73 year old gentleman presenting for cardiac followup evaluation. He's followed for coronary artery disease status post CABG in 2006. He has peripheral arterial disease with left leg claudication and known popliteal stenosis. ABIs have been normal on the right and 0.7 on the left. The patient is also followed for carotid stenosis and aortic stenosis. He had a recent echocardiogram that demonstrated normal left ventricular systolic function and moderate aortic stenosis with a mean and peak gradient of 29 and 39 mm mercury, respectively..  Last lipids April 2014: Lipid Panel     Component Value Date/Time   CHOL 106 11/27/2012 0925   TRIG 47.0 11/27/2012 0925   HDL 38.70* 11/27/2012 0925   CHOLHDL 3 11/27/2012 0925   VLDL 9.4 11/27/2012 0925   LDLCALC 58 11/27/2012 0925   The patient is doing fine. He denies any cardiac symptoms. He specifically denies chest pain, shortness of breath, lightheadedness, leg claudication, edema, palpitations, orthopnea, or PND.  Outpatient Encounter Prescriptions as of 11/21/2013  Medication Sig  . Ascorbic Acid (VITAMIN C) 500 MG tablet Take 500 mg by mouth daily.    Marland Kitchen aspirin 325 MG EC tablet Take 325 mg by mouth daily.  Marland Kitchen atorvastatin (LIPITOR) 80 MG tablet TAKE 1 TABLET DAILY  . Calcium Carbonate-Vitamin D (CALTRATE 600+D) 600-400 MG-UNIT per tablet Take 1 tablet by mouth daily.    . cilostazol (PLETAL) 100 MG tablet TAKE 1 TABLET TWICE A DAY  . hydrochlorothiazide (HYDRODIURIL) 12.5 MG tablet Take 12.5 mg by mouth daily.  . irbesartan (AVAPRO) 150 MG tablet Take 1 tablet (150 mg total) by mouth at bedtime.  . Multiple Vitamins-Minerals (DIASENSE MULTIVITAMIN) TABS Take 1 tablet by mouth.    . nitroGLYCERIN (NITROSTAT) 0.3 MG SL tablet Place 1 tablet (0.3 mg total) under the tongue as needed. One under tongue as needed chest pain  . [DISCONTINUED] niacin (NIASPAN) 1000 MG CR tablet TAKE 1 TABLET AT BEDTIME    No Known Allergies  Past Medical History    Diagnosis Date  . CAD (coronary artery disease) of artery bypass graft   . Hypertension   . Hyperlipidemia   . PVD (peripheral vascular disease)   . Carotid stenosis   . Hyperglycemia    ROS: Negative except as per HPI  BP 130/62  Pulse 102  Ht 5\' 6"  (1.676 m)  Wt 145 lb 12.8 oz (66.134 kg)  BMI 23.54 kg/m2  PHYSICAL EXAM: Pt is alert and oriented, pleasant male in NAD HEENT: normal Neck: JVP - normal, carotids 2+= with bilateral bruits Lungs: CTA bilaterally CV: regularly irregular with grade 3/6 harsh crescendo decrescendo murmur heard throughout Abd: soft, NT, Positive BS, no hepatomegaly Ext: no C/C/E Skin: warm/dry no rash  EKG:  Multifocal atrial tachycardia, heart rate 102 beats per minute, rightward axis.  2D Echo: Study Conclusions  - Left ventricle: The cavity size was normal. There was moderate concentric hypertrophy. Systolic function was vigorous. The estimated ejection fraction was in the range of 65% to 70%. Wall motion was normal; there were no regional wall motion abnormalities. Doppler parameters are consistent with abnormal left ventricular relaxation (grade 1 diastolic dysfunction). Doppler parameters are consistent with elevated ventricular end-diastolic filling pressure. - Aortic valve: Trileaflet; severely thickened, severely calcified leaflets. Valve mobility was restricted. There was moderate stenosis. Peak and mean transaortic gradinets were 39 and 29 mmHg, consecutively. Moderate regurgitation. Mean gradient: 78mm Hg (S). Peak gradient: 87mm Hg (S). - Mitral valve: Transvalvular velocity was within the normal range. There was  no evidence for stenosis. Mild regurgitation. - Left atrium: The atrium was mildly dilated. - Tricuspid valve: Trivial regurgitation. - Pulmonic valve: No regurgitation. - Pulmonary arteries: Systolic pressure was within the normal range. - Impressions: Slighly higher transaortic gradients when compared to the  study from March 2013, but still moderate stenosis. Impressions:  - Slighly higher transaortic gradients when compared to the study from March 2013, but still moderate stenosis.  ASSESSMENT AND PLAN: 1. Coronary artery disease status post CABG. The patient is stable without symptoms of angina. He is on aspirin and a statin drug. I will see him back in 6 months.  2. Hypertension. Blood pressure is well controlled on his current medical therapy.  3. Moderate aortic stenosis. Reviewed the patient's recent echocardiogram results with him. Compared his echo to his previous study from 2013. Recommend a repeat echocardiogram in one year. The patient is asymptomatic.  4. Peripheral arterial disease. Asymptomatic at present.  5. Hyperlipidemia. Patient history it was atorvastatin 80 mg. He is followed by Dr. Leanne Chang.  6. Multifocal atrial tachycardia. Heart rate was initially elevated slightly at 102 beats per minute. On my exam his heart rate was 88 beats per minute. He is completely asymptomatic. We discussed the fact that this is generally associated with lung disease. I strongly advised complete tobacco cessation.  Sherren Mocha 11/21/2013 10:55 AM

## 2013-11-21 NOTE — Patient Instructions (Signed)
Your physician wants you to follow-up in: 6 MONTHS with Dr Cooper.  You will receive a reminder letter in the mail two months in advance. If you don't receive a letter, please call our office to schedule the follow-up appointment.  Your physician recommends that you continue on your current medications as directed. Please refer to the Current Medication list given to you today.  

## 2013-12-18 ENCOUNTER — Ambulatory Visit: Payer: Medicare Other | Admitting: Cardiovascular Disease

## 2013-12-22 ENCOUNTER — Encounter: Payer: Self-pay | Admitting: Internal Medicine

## 2013-12-22 ENCOUNTER — Ambulatory Visit (INDEPENDENT_AMBULATORY_CARE_PROVIDER_SITE_OTHER): Payer: Medicare Other | Admitting: Internal Medicine

## 2013-12-22 VITALS — BP 132/70 | HR 96 | Temp 98.4°F | Wt 141.0 lb

## 2013-12-22 DIAGNOSIS — E785 Hyperlipidemia, unspecified: Secondary | ICD-10-CM | POA: Diagnosis not present

## 2013-12-22 DIAGNOSIS — I25810 Atherosclerosis of coronary artery bypass graft(s) without angina pectoris: Secondary | ICD-10-CM

## 2013-12-22 DIAGNOSIS — R7309 Other abnormal glucose: Secondary | ICD-10-CM

## 2013-12-22 LAB — CBC WITH DIFFERENTIAL/PLATELET
Basophils Absolute: 0 10*3/uL (ref 0.0–0.1)
Basophils Relative: 0.2 % (ref 0.0–3.0)
EOS PCT: 0.8 % (ref 0.0–5.0)
Eosinophils Absolute: 0.1 10*3/uL (ref 0.0–0.7)
HCT: 41.4 % (ref 39.0–52.0)
Hemoglobin: 14.3 g/dL (ref 13.0–17.0)
Lymphocytes Relative: 23.7 % (ref 12.0–46.0)
Lymphs Abs: 2.5 10*3/uL (ref 0.7–4.0)
MCHC: 34.5 g/dL (ref 30.0–36.0)
MCV: 99.5 fl (ref 78.0–100.0)
MONO ABS: 0.7 10*3/uL (ref 0.1–1.0)
MONOS PCT: 6.7 % (ref 3.0–12.0)
NEUTROS PCT: 68.6 % (ref 43.0–77.0)
Neutro Abs: 7.2 10*3/uL (ref 1.4–7.7)
PLATELETS: 278 10*3/uL (ref 150.0–400.0)
RBC: 4.16 Mil/uL — ABNORMAL LOW (ref 4.22–5.81)
RDW: 14.2 % (ref 11.5–15.5)
WBC: 10.6 10*3/uL — AB (ref 4.0–10.5)

## 2013-12-22 LAB — HEPATIC FUNCTION PANEL
ALT: 21 U/L (ref 0–53)
AST: 25 U/L (ref 0–37)
Albumin: 4 g/dL (ref 3.5–5.2)
Alkaline Phosphatase: 65 U/L (ref 39–117)
Bilirubin, Direct: 0.1 mg/dL (ref 0.0–0.3)
TOTAL PROTEIN: 6.6 g/dL (ref 6.0–8.3)
Total Bilirubin: 0.8 mg/dL (ref 0.2–1.2)

## 2013-12-22 LAB — LIPID PANEL
CHOLESTEROL: 101 mg/dL (ref 0–200)
HDL: 45.4 mg/dL (ref >39.00)
LDL Cholesterol: 47 mg/dL (ref 0–99)
Total CHOL/HDL Ratio: 2
Triglycerides: 43 mg/dL (ref 0.0–149.0)
VLDL: 8.6 mg/dL (ref 0.0–40.0)

## 2013-12-22 LAB — BASIC METABOLIC PANEL
BUN: 13 mg/dL (ref 6–23)
CO2: 28 mEq/L (ref 19–32)
Calcium: 9.5 mg/dL (ref 8.4–10.5)
Chloride: 106 mEq/L (ref 96–112)
Creatinine, Ser: 1.1 mg/dL (ref 0.4–1.5)
GFR: 72.1 mL/min (ref >60.00)
Glucose, Bld: 99 mg/dL (ref 70–99)
POTASSIUM: 4.1 meq/L (ref 3.5–5.1)
Sodium: 142 mEq/L (ref 135–145)

## 2013-12-22 LAB — HEMOGLOBIN A1C: Hgb A1c MFr Bld: 5.3 % (ref 4.6–6.5)

## 2013-12-22 LAB — TSH: TSH: 0.52 u[IU]/mL (ref 0.35–4.50)

## 2013-12-22 MED ORDER — ATORVASTATIN CALCIUM 80 MG PO TABS: ORAL_TABLET | ORAL | Status: DC

## 2013-12-22 NOTE — Assessment & Plan Note (Signed)
Check labs today Previously controlled

## 2013-12-22 NOTE — Progress Notes (Signed)
Pre visit review using our clinic review tool, if applicable. No additional management support is needed unless otherwise documented below in the visit note. 

## 2013-12-22 NOTE — Progress Notes (Signed)
CAD  Lipids-   htn- tolerating meds  Hyperglycemia- she has lost weight  Aortic valve stenosis- moderate  Past Medical History  Diagnosis Date  . CAD (coronary artery disease) of artery bypass graft   . Hypertension   . Hyperlipidemia   . PVD (peripheral vascular disease)   . Carotid stenosis   . Hyperglycemia     History   Social History  . Marital Status: Married    Spouse Name: N/A    Number of Children: N/A  . Years of Education: N/A   Occupational History  . Not on file.   Social History Main Topics  . Smoking status: Current Every Day Smoker -- 1.00 packs/day for 40 years    Types: Cigarettes  . Smokeless tobacco: Never Used  . Alcohol Use: Yes     Comment: occasionaly  . Drug Use: Not on file  . Sexual Activity: Not on file   Other Topics Concern  . Not on file   Social History Narrative  . No narrative on file    Past Surgical History  Procedure Laterality Date  . Coronary artery bypass graft  2006    Family History  Problem Relation Age of Onset  . Heart attack Father     No Known Allergies  Current Outpatient Prescriptions on File Prior to Visit  Medication Sig Dispense Refill  . Ascorbic Acid (VITAMIN C) 500 MG tablet Take 500 mg by mouth daily.        Marland Kitchen aspirin 325 MG EC tablet Take 325 mg by mouth daily.      Marland Kitchen atorvastatin (LIPITOR) 80 MG tablet TAKE 1 TABLET DAILY  90 tablet  0  . Calcium Carbonate-Vitamin D (CALTRATE 600+D) 600-400 MG-UNIT per tablet Take 1 tablet by mouth daily.        . cilostazol (PLETAL) 100 MG tablet TAKE 1 TABLET TWICE A DAY  180 tablet  1  . hydrochlorothiazide (HYDRODIURIL) 12.5 MG tablet Take 12.5 mg by mouth daily.      . irbesartan (AVAPRO) 150 MG tablet Take 1 tablet (150 mg total) by mouth at bedtime.  90 tablet  3  . Multiple Vitamins-Minerals (DIASENSE MULTIVITAMIN) TABS Take 1 tablet by mouth.        . nitroGLYCERIN (NITROSTAT) 0.3 MG SL tablet Place 1 tablet (0.3 mg total) under the tongue as  needed. One under tongue as needed chest pain  90 tablet  0   No current facility-administered medications on file prior to visit.     patient denies chest pain, shortness of breath, orthopnea. Denies lower extremity edema, abdominal pain, change in appetite, change in bowel movements. Patient denies rashes, musculoskeletal complaints. No other specific complaints in a complete review of systems.   BP 132/70  Pulse 96  Temp(Src) 98.4 F (36.9 C) (Oral)  Wt 141 lb (63.957 kg)  SpO2 98%  well-developed well-nourished male in no acute distress. HEENT exam atraumatic, normocephalic, neck supple without jugular venous distention. Chest clear to auscultation cardiac exam S1-S2 are regular. Abdominal exam overweight with bowel sounds, soft and nontender. Extremities no edema. Neurologic exam is alert with a normal gait.  HYPERGLYCEMIA Check labs today  HYPERLIPIDEMIA Check labs today Previously controlled  CAD, ARTERY BYPASS GRAFT No sxs- check labs today

## 2013-12-22 NOTE — Assessment & Plan Note (Signed)
Check labs today.

## 2013-12-22 NOTE — Assessment & Plan Note (Signed)
No sxs- check labs today

## 2013-12-23 ENCOUNTER — Telehealth: Payer: Self-pay | Admitting: Internal Medicine

## 2013-12-23 NOTE — Telephone Encounter (Signed)
Relevant patient education mailed to patient.  

## 2013-12-26 ENCOUNTER — Other Ambulatory Visit: Payer: Self-pay | Admitting: *Deleted

## 2013-12-26 MED ORDER — ATORVASTATIN CALCIUM 40 MG PO TABS: 40.0000 mg | ORAL_TABLET | Freq: Every day | ORAL | Status: DC

## 2014-01-02 ENCOUNTER — Telehealth: Payer: Self-pay | Admitting: Internal Medicine

## 2014-01-02 NOTE — Telephone Encounter (Signed)
[facility] is calling regarding pt's rx atorvastatin (LIPITOR) 40 MG tablet, states they have a 40 mg and a 80 mg rx, need to know if pt is on both or which one is correct.

## 2014-01-05 NOTE — Telephone Encounter (Signed)
Faxed over correct dosage to pharmacy

## 2014-02-04 ENCOUNTER — Telehealth: Payer: Self-pay | Admitting: Internal Medicine

## 2014-02-04 MED ORDER — IRBESARTAN 150 MG PO TABS: 150.0000 mg | ORAL_TABLET | Freq: Every day | ORAL | Status: DC

## 2014-02-04 MED ORDER — CILOSTAZOL 100 MG PO TABS: ORAL_TABLET | ORAL | Status: DC

## 2014-02-04 MED ORDER — NIACIN ER (ANTIHYPERLIPIDEMIC) 1000 MG PO TBCR: EXTENDED_RELEASE_TABLET | ORAL | Status: DC

## 2014-02-04 NOTE — Telephone Encounter (Signed)
rx sent in electronically 

## 2014-02-04 NOTE — Telephone Encounter (Signed)
Pt needs new rx ntg 0.3 mg #90 and refills on irbesartan 150 mg #90,cilostazol  100 mg #90,niacin er 1000 mg #90 with refills sent to [facility]

## 2014-02-05 ENCOUNTER — Telehealth: Payer: Self-pay | Admitting: Internal Medicine

## 2014-02-05 MED ORDER — NITROGLYCERIN 0.3 MG SL SUBL: 0.3000 mg | SUBLINGUAL_TABLET | SUBLINGUAL | Status: DC | PRN

## 2014-02-05 NOTE — Telephone Encounter (Signed)
nitroGLYCERIN (NITROSTAT) 0.3 MG SL tablet re-fill is needed please.   [facility] [address].

## 2014-02-05 NOTE — Telephone Encounter (Signed)
Rx sent to pharmacy   

## 2014-04-13 DIAGNOSIS — Z23 Encounter for immunization: Secondary | ICD-10-CM | POA: Diagnosis not present

## 2014-05-29 ENCOUNTER — Ambulatory Visit (INDEPENDENT_AMBULATORY_CARE_PROVIDER_SITE_OTHER): Payer: Medicare Other | Admitting: Cardiovascular Disease

## 2014-05-29 ENCOUNTER — Encounter: Payer: Self-pay | Admitting: Cardiovascular Disease

## 2014-05-29 VITALS — BP 118/58 | HR 89 | Ht 66.0 in | Wt 137.1 lb

## 2014-05-29 DIAGNOSIS — I25810 Atherosclerosis of coronary artery bypass graft(s) without angina pectoris: Secondary | ICD-10-CM | POA: Diagnosis not present

## 2014-05-29 DIAGNOSIS — I10 Essential (primary) hypertension: Secondary | ICD-10-CM | POA: Diagnosis not present

## 2014-05-29 DIAGNOSIS — I350 Nonrheumatic aortic (valve) stenosis: Secondary | ICD-10-CM | POA: Diagnosis not present

## 2014-05-29 DIAGNOSIS — E785 Hyperlipidemia, unspecified: Secondary | ICD-10-CM

## 2014-05-29 DIAGNOSIS — I6523 Occlusion and stenosis of bilateral carotid arteries: Secondary | ICD-10-CM | POA: Diagnosis not present

## 2014-05-29 NOTE — Patient Instructions (Signed)
Your physician has requested that you have an echocardiogram in 6 MONTHS. Echocardiography is a painless test that uses sound waves to create images of your heart. It provides your doctor with information about the size and shape of your heart and how well your heart's chambers and valves are working. This procedure takes approximately one hour. There are no restrictions for this procedure.  Your physician has requested that you have a carotid duplex in 6 MONTHS. This test is an ultrasound of the carotid arteries in your neck. It looks at blood flow through these arteries that supply the brain with blood. Allow one hour for this exam. There are no restrictions or special instructions.  Your physician wants you to follow-up in: 6 MONTHS with Dr Burt Knack.  You will receive a reminder letter in the mail two months in advance. If you don't receive a letter, please call our office to schedule the follow-up appointment.  Your physician recommends that you continue on your current medications as directed. Please refer to the Current Medication list given to you today.

## 2014-05-29 NOTE — Progress Notes (Signed)
Background: The patient is followed for coronary artery disease status post CABG in 2006. He also has peripheral arterial disease with chronic left leg claudication and popliteal artery stenosis. He's been managed medically. ABIs have been in the range of 0.7 on the left. He also has carotid stenosis with no history of stroke. The patient has had moderate aortic stenosis with no associated symptoms in the past.  HPI:  73 year old gentleman presenting for followup evaluation. He reports no interval cardiac complaints since his last visit here in May. He specifically denies chest pain, chest pressure, lightheadedness, shortness of breath, leg swelling, or heart palpitations.   He continues to lose weight. He states his appetite is not good. He denies abdominal pain. He's had no night sweats, fevers, chills, or cough. He continues to smoke cigarettes. He is still working as a Recruitment consultant for [employer]  Studies:  2-D echocardiogram 11/03/2013: Study Conclusions  - Left ventricle: The cavity size was normal. There was moderate concentric hypertrophy. Systolic function was vigorous. The estimated ejection fraction was in the range of 65% to 70%. Wall motion was normal; there were no regional wall motion abnormalities. Doppler parameters are consistent with abnormal left ventricular relaxation (grade 1 diastolic dysfunction). Doppler parameters are consistent with elevated ventricular end-diastolic filling pressure. - Aortic valve: Trileaflet; severely thickened, severely calcified leaflets. Valve mobility was restricted. There was moderate stenosis. Peak and mean transaortic gradinets were 39 and 29 mmHg, consecutively. Moderate regurgitation. Mean gradient: 57mm Hg (S). Peak gradient: 55mm Hg (S). - Mitral valve: Transvalvular velocity was within the normal range. There was no evidence for stenosis. Mild regurgitation. - Left atrium: The atrium was mildly dilated. - Tricuspid  valve: Trivial regurgitation. - Pulmonic valve: No regurgitation. - Pulmonary arteries: Systolic pressure was within the normal range. - Impressions: Slighly higher transaortic gradients when compared to the study from March 2013, but still moderate stenosis. Impressions:  - Slighly higher transaortic gradients when compared to the study from March 2013, but still moderate stenosis.  Lipid Panel 12/22/2013    Component Value Date/Time   CHOL 101 12/22/2013 1003   TRIG 43.0 12/22/2013 1003   HDL 45.40 12/22/2013 1003   CHOLHDL 2 12/22/2013 1003   VLDL 8.6 12/22/2013 1003   LDLCALC 47 12/22/2013 1003      Outpatient Encounter Prescriptions as of 05/29/2014  Medication Sig  . Ascorbic Acid (VITAMIN C) 500 MG tablet Take 500 mg by mouth daily.    Marland Kitchen aspirin 325 MG EC tablet Take 325 mg by mouth daily.  Marland Kitchen atorvastatin (LIPITOR) 40 MG tablet Take 1 tablet (40 mg total) by mouth daily.  . Calcium Carbonate-Vitamin D (CALTRATE 600+D) 600-400 MG-UNIT per tablet Take 1 tablet by mouth daily.    . cilostazol (PLETAL) 100 MG tablet TAKE 1 TABLET TWICE A DAY  . hydrochlorothiazide (HYDRODIURIL) 12.5 MG tablet Take 12.5 mg by mouth daily.  . irbesartan (AVAPRO) 150 MG tablet Take 1 tablet (150 mg total) by mouth at bedtime.  . Multiple Vitamins-Minerals (DIASENSE MULTIVITAMIN) TABS Take 1 tablet by mouth.    . niacin (NIASPAN) 1000 MG CR tablet TAKE 1 TABLET AT BEDTIME  . nitroGLYCERIN (NITROSTAT) 0.3 MG SL tablet Place 1 tablet (0.3 mg total) under the tongue as needed. One under tongue as needed chest pain    No Known Allergies  Past Medical History  Diagnosis Date  . CAD (coronary artery disease) of artery bypass graft   . Hypertension   . Hyperlipidemia   .  PVD (peripheral vascular disease)   . Carotid stenosis   . Hyperglycemia     family history includes Heart attack in his father.   ROS: Negative except as per HPI  BP 118/58  Pulse 89  Ht 5\' 6"  (1.676 m)  Wt 137 lb 1.9 oz  (62.197 kg)  BMI 22.14 kg/m2  PHYSICAL EXAM: Pt is alert and oriented, thin gentleman in NAD HEENT: normal Neck: JVP - normal, carotids 2+= with bilateral bruits Lungs: CTA bilaterally CV: RRR with grade 2/6 harsh crescendo decrescendo, mid peaking systolic murmur at the left sternal border Abd: soft, NT, Positive BS, no hepatomegaly Ext: no C/C/E, distal pulses intact and equal Skin: warm/dry no rash  EKG:  Normal sinus rhythm 89 beats per minute, occasional PAC, otherwise within normal limits.  ASSESSMENT AND PLAN: 1. Coronary artery disease, native vessel. The patient is stable without symptoms of angina. Will continue his current medical program without changes. He is approaching 10 years out from CABG.   2. Carotid stenosis without history of stroke. Last carotid Doppler study from 2014 showed 40-59% right internal carotid artery stenosis and 60-79% left internal carotid artery stenosis. Will repeat a carotid duplex scan when he returns for followup in 6 months.  3. Moderate aortic stenosis. He is asymptomatic. Repeat echocardiogram in 6 months.  4. Essential hypertension. Blood pressure is well controlled on his current medications.  5. Lower extremity peripheral arterial disease. He is having no symptoms of claudication. He remains on cilostazol. Smoking cessation was advised.  6. Hyperlipidemia. The patient is treated with atorvastatin 40 mg daily. Most recent labs were reviewed. LDL cholesterol was 47 mill grams per deciliter.  7. Weight loss/protein calorie malnutrition. Over the last several years the patient has lost 40 pounds. He reports poor appetite. He otherwise has no localizing symptoms. He is going to establish care with Dr. Yong Channel next month. I will defer to him for consideration of evaluating this patient's weight loss. Certainly his long smoking history raises concern in this context.  Sherren Mocha MD 05/29/2014 9:45 AM

## 2014-06-08 ENCOUNTER — Ambulatory Visit (INDEPENDENT_AMBULATORY_CARE_PROVIDER_SITE_OTHER): Payer: Medicare Other | Admitting: Family Medicine

## 2014-06-08 ENCOUNTER — Encounter: Payer: Self-pay | Admitting: Family Medicine

## 2014-06-08 DIAGNOSIS — R739 Hyperglycemia, unspecified: Secondary | ICD-10-CM | POA: Diagnosis not present

## 2014-06-08 DIAGNOSIS — R053 Chronic cough: Secondary | ICD-10-CM

## 2014-06-08 DIAGNOSIS — R634 Abnormal weight loss: Secondary | ICD-10-CM

## 2014-06-08 DIAGNOSIS — R05 Cough: Secondary | ICD-10-CM | POA: Diagnosis not present

## 2014-06-08 DIAGNOSIS — F17200 Nicotine dependence, unspecified, uncomplicated: Secondary | ICD-10-CM

## 2014-06-08 DIAGNOSIS — Z720 Tobacco use: Secondary | ICD-10-CM

## 2014-06-08 LAB — PSA, MEDICARE: PSA: 5.47 ng/ml — ABNORMAL HIGH (ref 0.10–4.00)

## 2014-06-08 NOTE — Assessment & Plan Note (Signed)
Strongly advised cessation. Patient would like to quit but not confident and does not want to try at this time

## 2014-06-08 NOTE — Assessment & Plan Note (Addendum)
In a patient I continues to smoke. He is up-to-date on his colonoscopy. We will send him for CT scan of his chest to evaluate for lung cancer especially with his chronic cough. Checked the PSA today which was elevated but difficult to interpret with no baseline-plan for repeat in 6 months but will offer patient a urology referral if he would like.his prostate was mildly enlarged and I suspect some underlying BPH.

## 2014-06-08 NOTE — Progress Notes (Signed)
Brett Reddish, MD Phone: [phone]  Subjective:  Patient presents today to establish care with me as their new primary care provider. Patient was formerly a patient of Dr. Leanne Chang. Chief complaint-noted.   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   Cancer risk/screening history  Follow up 5 year colonoscopy in 2016 due to history of precancerous polyps.  1/2 PPD on workday and 1 PPD otherwise. 55 years. 55 pack years.  No prostate cancer screening.   ROS-  no worsening fatigue, no night sweats, no fever/chills. States has a smokers cough-chronic cough for years.   The following were reviewed and entered/updated in epic: Past Medical History  Diagnosis Date  . CAD (coronary artery disease) of artery bypass graft   . Hypertension   . Hyperlipidemia   . PVD (peripheral vascular disease)   . Carotid stenosis   . Hyperglycemia    Patient Active Problem List   Diagnosis Date Noted  . Aortic valve stenosis, moderate 08/15/2011  . TOBACCO ABUSE 07/26/2009  . CAD, ARTERY BYPASS GRAFT 07/26/2009  . CAROTID STENOSIS 07/26/2009  . PERIPHERAL VASCULAR DISEASE 07/26/2009  . HYPERLIPIDEMIA 11/22/2007  . HYPERGLYCEMIA 04/10/2007  . HYPERTENSION 02/05/2007   Past Surgical History  Procedure Laterality Date  . Coronary artery bypass graft  2006    Family History  Problem Relation Age of Onset  . Heart attack Father     Medications- reviewed and updated Current Outpatient Prescriptions  Medication Sig Dispense Refill  . Ascorbic Acid (VITAMIN C) 500 MG tablet Take 500 mg by mouth daily.      Marland Kitchen aspirin 325 MG EC tablet Take 325 mg by mouth daily.    Marland Kitchen atorvastatin (LIPITOR) 40 MG tablet Take 1 tablet (40 mg total) by mouth daily. 90 tablet 3  . Calcium Carbonate-Vitamin D (CALTRATE 600+D) 600-400 MG-UNIT per tablet  Take 1 tablet by mouth daily.      . cilostazol (PLETAL) 100 MG tablet TAKE 1 TABLET TWICE A DAY 180 tablet 3  . hydrochlorothiazide (HYDRODIURIL) 12.5 MG tablet Take 12.5 mg by mouth daily.    . irbesartan (AVAPRO) 150 MG tablet Take 1 tablet (150 mg total) by mouth at bedtime. 90 tablet 3  . Multiple Vitamins-Minerals (DIASENSE MULTIVITAMIN) TABS Take 1 tablet by mouth.      . niacin (NIASPAN) 1000 MG CR tablet TAKE 1 TABLET AT BEDTIME 90 tablet 3  . nitroGLYCERIN (NITROSTAT) 0.3 MG SL tablet Place 1 tablet (0.3 mg total) under the tongue as needed. One under tongue as needed chest pain 90 tablet 0   No current facility-administered medications for this visit.    Allergies-reviewed and updated No Known Allergies  History   Social History  . Marital Status: Married    Spouse Name: N/A    Number of Children: N/A  . Years of Education: N/A   Social History Main Topics  . Smoking status: Current Every Day Smoker -- 1.00 packs/day for 40 years    Types: Cigarettes  . Smokeless tobacco: Never Used  . Alcohol Use: Yes     Comment: occasionaly  . Drug Use: None  . Sexual Activity: None   Other Topics Concern  . None   Social History Narrative    ROS--See HPI   Objective: BP 130/58 mmHg  Pulse 88  Temp(Src) 98.7 F (37.1  C)  Wt 139 lb (63.05 kg) Gen: NAD, resting comfortably in chair HEENT: Mucous membranes are moist. Oropharynx normal Neck: no thyromegaly, no lymphadenopathy CV: RRR no murmurs rubs or gallops Lungs: CTAB no crackles, wheeze, rhonchi Abdomen: soft/nontender/nondistended/normal bowel sounds. No rebound or guarding.  Rectal: mild enlargement of prostate but no nodules Ext: no edema Skin: warm, dry, no rash Neuro: grossly normal, moves all extremities  Assessment/Plan:  Unintentional weight loss In a patient I continues to smoke. He is up-to-date on his colonoscopy. We will send him for CT scan of his chest to evaluate for lung cancer especially with  his chronic cough. Checked the PSA today which was elevated but difficult to interpret with no baseline-plan for repeat in 6 months but will offer patient a urology referral if he would like.  TOBACCO ABUSE Strongly advised cessation. Patient would like to quit but not confident and does not want to try at this time  Hyperglycemia Had a one-time elevation in A1c of 5.8 many years ago. With weight loss A1c is closer to 5. Discontinue hyperglycemia in problem list   Return precautions advised.   Orders Placed This Encounter  Procedures  . CT CHEST LOW DOSE SCREENING W/O CM    Debra/LY. Medicare Part A & B Current smoker 1 ppd 40 years    Standing Status: Future     Number of Occurrences:      Standing Expiration Date: 08/09/2015    Order Specific Question:  Reason for Exam (SYMPTOM  OR DIAGNOSIS REQUIRED)    Answer:  weight loss, 55 pack years, cancer screening    Order Specific Question:  Preferred Imaging Location?    Answer:  [facility]  . PSA, Medicare

## 2014-06-08 NOTE — Patient Instructions (Addendum)
Concerned about weight loss  Want to screen/evlaute for potential cancer  3 most common 1. Colon cancer-you are up to date on screening 2. Prostate-slightly enlarged but no nodules on exam today, blood test ordered.  3. Lung cancer-my greatest concern-check with CT scan (they will call you with appointment.   See me in 2 months to recheck weight and see if we need to do further investigation unless something pops up before then.   Health Maintenance Due  Topic Date Due  . ZOSTAVAX /shingles-call your insurance to see where they want you to get it 06/22/2001

## 2014-06-08 NOTE — Assessment & Plan Note (Signed)
Had a one-time elevation in A1c of 5.8 many years ago. With weight loss A1c is closer to 5. Discontinue hyperglycemia in problem list

## 2014-06-11 ENCOUNTER — Ambulatory Visit (INDEPENDENT_AMBULATORY_CARE_PROVIDER_SITE_OTHER)
Admission: RE | Admit: 2014-06-11 | Discharge: 2014-06-11 | Disposition: A | Discharge status: Home / Self Care | Payer: Medicare Other | Source: Ambulatory Visit | Attending: Family Medicine | Admitting: Family Medicine

## 2014-06-11 DIAGNOSIS — R05 Cough: Secondary | ICD-10-CM

## 2014-06-11 DIAGNOSIS — Z720 Tobacco use: Secondary | ICD-10-CM | POA: Diagnosis not present

## 2014-06-11 DIAGNOSIS — R634 Abnormal weight loss: Secondary | ICD-10-CM

## 2014-06-11 DIAGNOSIS — J9809 Other diseases of bronchus, not elsewhere classified: Secondary | ICD-10-CM | POA: Diagnosis not present

## 2014-06-11 DIAGNOSIS — R053 Chronic cough: Secondary | ICD-10-CM

## 2014-06-11 DIAGNOSIS — F17200 Nicotine dependence, unspecified, uncomplicated: Secondary | ICD-10-CM

## 2014-06-11 DIAGNOSIS — J439 Emphysema, unspecified: Secondary | ICD-10-CM | POA: Diagnosis not present

## 2014-06-12 ENCOUNTER — Telehealth: Payer: Self-pay | Admitting: Family Medicine

## 2014-06-12 ENCOUNTER — Encounter: Payer: Self-pay | Admitting: Family Medicine

## 2014-06-12 ENCOUNTER — Ambulatory Visit (INDEPENDENT_AMBULATORY_CARE_PROVIDER_SITE_OTHER): Payer: Medicare Other | Admitting: Family Medicine

## 2014-06-12 ENCOUNTER — Telehealth: Payer: Self-pay

## 2014-06-12 DIAGNOSIS — Z23 Encounter for immunization: Secondary | ICD-10-CM | POA: Diagnosis not present

## 2014-06-12 DIAGNOSIS — R911 Solitary pulmonary nodule: Secondary | ICD-10-CM | POA: Insufficient documentation

## 2014-06-12 NOTE — Telephone Encounter (Signed)
Dr. Yong Channel has not reviewed results yet. He will call pt when he does, I let pt know that Dr. Yong Channel hasnt reviewed them yet when he was in for his shot earlier this morning and informed him we will call once Dr. Yong Channel reviews them.

## 2014-06-12 NOTE — Telephone Encounter (Signed)
Pt came in for shingles shot and is wanting results from chest x ray.

## 2014-06-12 NOTE — Telephone Encounter (Signed)
Pt called back again. Would like results this weekend

## 2014-06-12 NOTE — Telephone Encounter (Addendum)
Pt would results of xray. Wife states she is on hippa . Pt is working but wants results of the xray today if at all possible and would like you to give results to wife.

## 2014-06-12 NOTE — Telephone Encounter (Addendum)
error 

## 2014-10-22 ENCOUNTER — Encounter: Payer: Self-pay | Admitting: Internal Medicine

## 2014-11-25 ENCOUNTER — Ambulatory Visit (HOSPITAL_COMMUNITY): Payer: Medicare Other | Attending: Cardiovascular Disease | Admitting: Radiology

## 2014-11-25 ENCOUNTER — Ambulatory Visit (HOSPITAL_COMMUNITY): Payer: Medicare Other

## 2014-11-25 DIAGNOSIS — E785 Hyperlipidemia, unspecified: Secondary | ICD-10-CM | POA: Insufficient documentation

## 2014-11-25 DIAGNOSIS — I6523 Occlusion and stenosis of bilateral carotid arteries: Secondary | ICD-10-CM | POA: Diagnosis not present

## 2014-11-25 DIAGNOSIS — I10 Essential (primary) hypertension: Secondary | ICD-10-CM | POA: Diagnosis not present

## 2014-11-25 DIAGNOSIS — I2510 Atherosclerotic heart disease of native coronary artery without angina pectoris: Secondary | ICD-10-CM | POA: Diagnosis not present

## 2014-11-25 DIAGNOSIS — I080 Rheumatic disorders of both mitral and aortic valves: Secondary | ICD-10-CM | POA: Insufficient documentation

## 2014-11-25 DIAGNOSIS — Z95811 Presence of heart assist device: Secondary | ICD-10-CM | POA: Diagnosis not present

## 2014-11-25 DIAGNOSIS — I739 Peripheral vascular disease, unspecified: Secondary | ICD-10-CM | POA: Diagnosis not present

## 2014-11-25 DIAGNOSIS — I350 Nonrheumatic aortic (valve) stenosis: Secondary | ICD-10-CM | POA: Diagnosis not present

## 2014-11-25 NOTE — Progress Notes (Signed)
Echocardiogram Complete.  Marygrace Drought, RCS,

## 2014-11-25 NOTE — Progress Notes (Signed)
Carotid duplex scan performed 

## 2014-11-30 ENCOUNTER — Encounter (HOSPITAL_COMMUNITY): Payer: Medicare Other

## 2014-12-07 ENCOUNTER — Ambulatory Visit (INDEPENDENT_AMBULATORY_CARE_PROVIDER_SITE_OTHER): Payer: Medicare Other | Admitting: Cardiovascular Disease

## 2014-12-07 ENCOUNTER — Encounter: Payer: Self-pay | Admitting: Cardiovascular Disease

## 2014-12-07 VITALS — BP 110/52 | HR 92 | Ht 66.0 in | Wt 136.8 lb

## 2014-12-07 DIAGNOSIS — I6523 Occlusion and stenosis of bilateral carotid arteries: Secondary | ICD-10-CM | POA: Diagnosis not present

## 2014-12-07 DIAGNOSIS — I350 Nonrheumatic aortic (valve) stenosis: Secondary | ICD-10-CM

## 2014-12-07 DIAGNOSIS — I10 Essential (primary) hypertension: Secondary | ICD-10-CM | POA: Diagnosis not present

## 2014-12-07 NOTE — Patient Instructions (Addendum)
Medication Instructions:  Your physician recommends that you continue on your current medications as directed. Please refer to the Current Medication list given to you today.  Labwork: No new orders.  Testing/Procedures: Your physician has requested that you have an echocardiogram in 1 YEAR. Echocardiography is a painless test that uses sound waves to create images of your heart. It provides your doctor with information about the size and shape of your heart and how well your heart's chambers and valves are working. This procedure takes approximately one hour. There are no restrictions for this procedure.  Your physician has requested that you have a carotid duplex in 1 YEAR. This test is an ultrasound of the carotid arteries in your neck. It looks at blood flow through these arteries that supply the brain with blood. Allow one hour for this exam. There are no restrictions or special instructions.  Follow-Up: Your physician wants you to follow-up in: 1 YEAR with Dr Burt Knack.  You will receive a reminder letter in the mail two months in advance. If you don't receive a letter, please call our office to schedule the follow-up appointment.  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    Any Other Special Instructions Will Be Listed Below (If Applicable).

## 2014-12-07 NOTE — Progress Notes (Signed)
Cardiology Office Note   Date:  12/09/2014   ID:  Brett [name], DOB 12/27/1940, MRN [id number]  PCP:  Garret Reddish, MD  Cardiologist:  Sherren Mocha, MD    Chief Complaint  Patient presents with  . Coronary Artery Disease     History of Present Illness: Brett [name] is a 74 y.o. male who presents for follow-up of aortic stenosis, coronary artery disease, and carotid disease. His most recent echo studies have demonstrated moderate aortic stenosis. Patient underwent coronary bypass surgery in 2006 and has not had any subsequent ischemic events.  The patient is doing well. 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 He denies chest pain, chest pressure, shortness of breath, or leg swelling. His medications are unchanged. He is eager to review his echocardiogram and carotid studies from last week. Last year when I saw him, he was concerned about progressive weight loss. Things have stabilized. His appetite remains marginal. He's had no constitutional symptoms.   Past Medical History  Diagnosis Date  . CAD (coronary artery disease) of artery bypass graft   . Hypertension   . Hyperlipidemia   . PVD (peripheral vascular disease)   . Carotid stenosis   . Hyperglycemia     Past Surgical History  Procedure Laterality Date  . Coronary artery bypass graft  2006    Current Outpatient Prescriptions  Medication Sig Dispense Refill  . Ascorbic Acid (VITAMIN C) 500 MG tablet Take 500 mg by mouth daily.      Marland Kitchen aspirin 325 MG EC tablet Take 325 mg by mouth daily.    Marland Kitchen atorvastatin (LIPITOR) 40 MG tablet Take 1 tablet (40 mg total) by mouth daily. 90 tablet 3  . Calcium Carbonate-Vitamin D (CALTRATE 600+D) 600-400 MG-UNIT per tablet Take 1 tablet by mouth daily.      . cilostazol (PLETAL) 100 MG tablet TAKE 1 TABLET TWICE A DAY 180 tablet 3  . hydrochlorothiazide (HYDRODIURIL) 12.5 MG tablet Take 12.5 mg by mouth daily.    . irbesartan (AVAPRO) 150 MG tablet  Take 1 tablet (150 mg total) by mouth at bedtime. 90 tablet 3  . Multiple Vitamins-Minerals (DIASENSE MULTIVITAMIN) TABS Take 1 tablet by mouth.      . niacin (NIASPAN) 1000 MG CR tablet TAKE 1 TABLET AT BEDTIME 90 tablet 3  . nitroGLYCERIN (NITROSTAT) 0.3 MG SL tablet Place 1 tablet (0.3 mg total) under the tongue as needed. One under tongue as needed chest pain 90 tablet 0   No current facility-administered medications for this visit.    Allergies:   Review of patient's allergies indicates no known allergies.   Social History:  The patient  reports that he has been smoking Cigarettes.  He has a 40 pack-year smoking history. He has never used smokeless tobacco. He reports that he drinks alcohol.   Family History:  The patient's  family history includes Dementia in his mother; Heart attack in his father.   ROS:  Please see the history of present illness.  All other systems are reviewed and negative.   PHYSICAL EXAM: VS:  BP 110/52 mmHg  Pulse 92  Ht 5\' 6"  (1.676 m)  Wt 136 lb 12.8 oz (62.052 kg)  BMI 22.09 kg/m2 , BMI Body mass index is 22.09 kg/(m^2). GEN: Thin male, in no acute distress HEENT: normal Neck: no JVD, no masses. Bilateral carotid bruits are present Cardiac: RRR grade 2/6 mid peaking harsh systolic murmur at the right upper sternal  border             Respiratory:  clear to auscultation bilaterally, normal work of breathing GI: soft, nontender, nondistended, + BS MS: no deformity or atrophy Ext: no pretibial edema Skin: warm and dry, no rash Neuro:  Strength and sensation are intact Psych: euthymic mood, full affect  EKG:  EKG is ordered today. The ekg ordered today shows NSR 92 bpm, PAC's, otherwise within normal limits  Recent Labs: 12/22/2013: ALT 21; BUN 13; Creatinine 1.1; Hemoglobin 14.3; Platelets 278.0; Potassium 4.1; Sodium 142; TSH 0.52   Lipid Panel     Component Value Date/Time   CHOL 101 12/22/2013 1003   TRIG 43.0 12/22/2013 1003   TRIG 93  06/25/2006 0901   HDL 45.40 12/22/2013 1003   CHOLHDL 2 12/22/2013 1003   CHOLHDL 3.9 CALC 06/25/2006 0901   VLDL 8.6 12/22/2013 1003   LDLCALC 47 12/22/2013 1003      Wt Readings from Last 3 Encounters:  12/07/14 136 lb 12.8 oz (62.052 kg)  06/08/14 139 lb (63.05 kg)  05/29/14 137 lb 1.9 oz (62.197 kg)     Cardiac Studies Reviewed: 2-D echocardiogram 11/25/2014: Study Conclusions  - Left ventricle: The cavity size was normal. There was mild concentric hypertrophy. Systolic function was vigorous. The estimated ejection fraction was in the range of 65% to 70%. Wall motion was normal; there were no regional wall motion abnormalities. Doppler parameters are consistent with abnormal left ventricular relaxation (grade 1 diastolic dysfunction). - Aortic valve: There was moderate stenosis. There was mild regurgitation. Valve area (VTI): 1.26 cm^2. Valve area (Vmean): 1.16 cm^2. - Mitral valve: Calcified annulus. There was mild regurgitation. Valve area by continuity equation (using LVOT flow): 3.2 cm^2. - Left atrium: The atrium was mildly dilated. - Atrial septum: No defect or patent foramen ovale was identified.  ASSESSMENT AND PLAN: 1.  Moderate aortic stenosis: I have reviewed the patient's echocardiogram and compared his current study to previous. His aortic stenosis remains stable by exam and echo criteria. He is not experiencing any cardiac symptoms. Continue observation and annual clinical follow-up.  2. Carotid stenosis without history of stroke: Advised complete tobacco cessation. Reviewed his recent carotid duplex scan which showed stable 60-79% left internal carotid artery stenosis and 40-59% right internal carotid artery stenosis. Will continue current medical management and follow-up with carotid duplex ultrasound evaluations as recommended.  3. Coronary artery disease, native vessel, without symptoms of angina: The patient is 10 years out from CABG. He  continues to do well with no symptoms of angina. Medications reviewed.  4. Hyperlipidemia: Treated with a statin drug. Lab review as documented above, lipids at goal.  5. Essential hypertension: Blood pressure under good control.   Current medicines are reviewed with the patient today.  The patient does not have concerns regarding medicines.  Labs/ tests ordered today include:   Orders Placed This Encounter  Procedures  . EKG 12-Lead  . Echocardiogram    Disposition:   FU one year with an echocardiogram.  Signed, Sherren Mocha, MD  12/09/2014 5:39 AM    [address], [place], [place]  [address] Phone: [phone]; Fax: [phone]

## 2014-12-08 ENCOUNTER — Encounter: Payer: Self-pay | Admitting: Cardiovascular Disease

## 2014-12-24 ENCOUNTER — Telehealth: Payer: Self-pay | Admitting: Family Medicine

## 2014-12-24 MED ORDER — ATORVASTATIN CALCIUM 40 MG PO TABS
40.0000 mg | ORAL_TABLET | Freq: Every day | ORAL | Status: DC
Start: 2014-12-24 — End: 2015-12-13

## 2014-12-24 MED ORDER — IRBESARTAN 150 MG PO TABS: 150.0000 mg | ORAL_TABLET | Freq: Every day | ORAL | Status: DC

## 2014-12-24 MED ORDER — NIACIN ER (ANTIHYPERLIPIDEMIC) 1000 MG PO TBCR: EXTENDED_RELEASE_TABLET | ORAL | Status: DC

## 2014-12-24 MED ORDER — CILOSTAZOL 100 MG PO TABS: ORAL_TABLET | ORAL | Status: DC

## 2014-12-24 NOTE — Telephone Encounter (Signed)
Medications refilled

## 2014-12-24 NOTE — Telephone Encounter (Signed)
Pt request refill of the following: irbesartan (AVAPRO) 150 MG tablet  atorvastatin (LIPITOR) 40 MG tablet  niacin (NIASPAN) 1000 MG CR tablet cilostazol (PLETAL) 100 MG tablet   Should be 90 day supply   Phamacy:   [facility]

## 2014-12-25 ENCOUNTER — Encounter: Payer: Self-pay | Admitting: *Deleted

## 2014-12-28 ENCOUNTER — Encounter: Payer: Self-pay | Admitting: Physician Assistant

## 2014-12-28 ENCOUNTER — Ambulatory Visit (INDEPENDENT_AMBULATORY_CARE_PROVIDER_SITE_OTHER): Payer: Medicare Other | Admitting: Physician Assistant

## 2014-12-28 ENCOUNTER — Telehealth: Payer: Self-pay | Admitting: *Deleted

## 2014-12-28 VITALS — BP 116/60 | HR 80 | Ht 66.0 in | Wt 139.2 lb

## 2014-12-28 DIAGNOSIS — Z86010 Personal history of colonic polyps: Secondary | ICD-10-CM | POA: Diagnosis not present

## 2014-12-28 DIAGNOSIS — Z7901 Long term (current) use of anticoagulants: Secondary | ICD-10-CM | POA: Diagnosis not present

## 2014-12-28 DIAGNOSIS — I6523 Occlusion and stenosis of bilateral carotid arteries: Secondary | ICD-10-CM | POA: Diagnosis not present

## 2014-12-28 MED ORDER — POLYETHYLENE GLYCOL 3350 17 GM/SCOOP PO POWD: ORAL | Status: DC

## 2014-12-28 NOTE — Telephone Encounter (Signed)
12/28/2014   RE: Brett [name] DOB: 1941/07/19 MRN: [id number]   Dear Dr. Sherren Mocha,    We have scheduled the above patient for an endoscopic procedure. Our records show that he is on anticoagulation therapy.   Please advise as to how long the patient may come off his therapy of Pletal prior to the procedure, which is scheduled for 02-09-2015.  Please fax back/ or route the completed form to [place] at [phone].   Sincerely,    Amy Esterwood PA-C

## 2014-12-28 NOTE — Telephone Encounter (Signed)
pletal is a very weak blood-thinning medication, but it's fine to hold it if necessary. I don't really know how long - probably 3 days prior and can restart the day after the procedure.  thx

## 2014-12-28 NOTE — Progress Notes (Addendum)
Patient ID: Brett [name], male   DOB: 30-Aug-1940, 74 y.o.   MRN: [id number]   Subjective:    Patient ID: Brett [name], male    DOB: April 06, 1941, 74 y.o.   MRN: [id number]  HPI Brett [name]  is a 74 year old white male known to Brett.Pyrtle with history of colon polyps who comes in today to discuss follow-up colonoscopy. He had undergone colonoscopy in April 2011 and was found to have moderate diverticulosis and multiple colon polyps in the rectum and sigmoid colon. These were removed and all were on past to be hyperplastic polyps. He was recommended for 5 year follow-up due to timing of multiple polyps.  Patient does have history of coronary artery disease and moderate aortic stenosis also with carotid stenosis and peripheral vascular disease. He has been maintained on Pletal twice daily for his peripheral vascular disease. He does not have any stents. He did undergo remote CABG in 2006. Last EF measured at 65-70%. He is followed by Brett. Burt [name] for cardiology and Brett. Yong [name] for primary care. He has no current GI complaints, no problems with abdominal pain and changes in bowel habits melena or hematochezia.  Review of Systems Pertinent positive and negative review of systems were noted in the above HPI section.  All other review of systems was otherwise negative.  Outpatient Encounter Prescriptions as of 12/28/2014  Medication Sig  . Ascorbic Acid (VITAMIN C) 500 MG tablet Take 500 mg by mouth daily.    Marland Kitchen atorvastatin (LIPITOR) 40 MG tablet Take 1 tablet (40 mg total) by mouth daily.  . Calcium Carbonate-Vitamin D (CALTRATE 600+D) 600-400 MG-UNIT per tablet Take 1 tablet by mouth daily.    . cilostazol (PLETAL) 100 MG tablet TAKE 1 TABLET TWICE A DAY  . irbesartan (AVAPRO) 150 MG tablet Take 1 tablet (150 mg total) by mouth at bedtime.  . Multiple Vitamins-Minerals (DIASENSE MULTIVITAMIN) TABS Take 1 tablet by mouth.    . niacin (NIASPAN) 1000 MG CR tablet TAKE 1 TABLET AT BEDTIME  . niacin 500 MG tablet  Take 500 mg by mouth at bedtime. Flesh- free  . nitroGLYCERIN (NITROSTAT) 0.3 MG SL tablet Place 1 tablet (0.3 mg total) under the tongue as needed. One under tongue as needed chest pain  . polyethylene glycol powder (GLYCOLAX/MIRALAX) powder Take as directed.  . [DISCONTINUED] aspirin 325 MG EC tablet Take 325 mg by mouth daily.  . [DISCONTINUED] hydrochlorothiazide (HYDRODIURIL) 12.5 MG tablet Take 12.5 mg by mouth daily.   No facility-administered encounter medications on file as of 12/28/2014.   No Known Allergies Patient Active Problem List   Diagnosis Date Noted  . Pulmonary nodule 06/12/2014  . Unintentional weight loss 06/08/2014  . Aortic valve stenosis, moderate 08/15/2011  . TOBACCO ABUSE 07/26/2009  . CAD, ARTERY BYPASS GRAFT 07/26/2009  . CAROTID STENOSIS 07/26/2009  . Peripheral vascular disease 07/26/2009  . Hyperlipidemia 11/22/2007  . Essential hypertension 02/05/2007   History   Social History  . Marital Status: Married    Spouse Name: N/A  . Number of Children: 3  . Years of Education: N/A   Occupational History  . Retired    Social History Main Topics  . Smoking status: Current Every Day Smoker -- 1.00 packs/day for 40 years    Types: Cigarettes  . Smokeless tobacco: Never Used  . Alcohol Use: 0.0 oz/week    0 Standard drinks or equivalent per week     Comment: occasionaly  . Drug Use: No  . Sexual Activity: Not  on file   Other Topics Concern  . Not on file   Social History Narrative   Married 1988 2nd marriage. 1 child from 2nd marriage (1994). 2 children from first marriage. 3-4 grandkids-broken relationship with his son though.       Retired from [employer] after 36 years in 1999. 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       Hobbies: gardening, formerly bowling and pool, family time    Brett [name] family history includes Dementia in his mother; Heart attack in his father.      Objective:    Filed Vitals:   12/28/14  1008  BP: 116/60  Pulse: 80    Physical Exam   Well-developed older white male in no acute distress, pleasant accompanied by his wife blood pressure 116/60 pulse 80 height 5 foot 6 weight 139. HEENT ;nontraumatic normocephalic EOMI PERRLA sclera anicteric, Supple ;no JVD, Cardiovascular; regular rate and rhythm with S1-S2 no murmur rub or gallop, Pulmonary; clear bilaterally, Abdomen; soft, nontender, nondistended, bowel sounds are active there is no palpable mass or hepatosplenomegaly, Rectal ;exam not done, Extremities; no clubbing cyanosis or edema skin warm and dry, Psych; mood and affect appropriate       Assessment & Plan:   #1 74 yo male with hx of numerous colon polyps/hyperplastic with last colonoscopy 11/2009- due for follow up. #2 chronic antiplatelet therapy-Pletal #3 CAD - s/p CABG #4 carotid stenosis #5 PVD #6 HTN  Plan; Will schedule for Colonoscopy with Brett. Hilarie [name] -procedure discussed in detail with pt and wife and they are agreeable to proceed. Will communicate with Brett [name]/cartdiology to assure that holding Pletal for 5 days prior to procedure is reasonable for this pt   Brett Ferguson PA-C 12/28/2014   Cc: Brett Olp, MD  Addendum: Reviewed and agree with initial management. Brett Bears, MD

## 2014-12-28 NOTE — Patient Instructions (Addendum)
You have been scheduled for a colonoscopy. Please follow written instructions given to you at your visit today.  Please pick up your prep supplies at the pharmacy within the next 1-3 days. [facility]. If you use inhalers (even only as needed), please bring them with you on the day of your procedure. Your physician has requested that you go to www.startemmi.com and enter the access code given to you at your visit today. This web site gives a general overview about your procedure. However, you should still follow specific instructions given to you by our office regarding your preparation for the procedure.

## 2014-12-29 NOTE — Telephone Encounter (Signed)
LM for the patient to please call me back regarding the Pletal Medication information from Dr. Burt Knack.

## 2014-12-29 NOTE — Telephone Encounter (Signed)
The patient returned my call and I advised him to stop his Pletal on 7-2 and resume it on 02-10-2015 unless otherwise instructed after the procedure.  Patient verbalized understanding the medication instructions.

## 2015-02-09 ENCOUNTER — Encounter: Payer: PRIVATE HEALTH INSURANCE | Admitting: Internal Medicine

## 2015-02-12 ENCOUNTER — Encounter: Payer: PRIVATE HEALTH INSURANCE | Admitting: Internal Medicine

## 2015-03-02 ENCOUNTER — Ambulatory Visit (AMBULATORY_SURGERY_CENTER): Payer: Self-pay | Admitting: *Deleted

## 2015-03-02 VITALS — Ht 66.75 in | Wt 134.2 lb

## 2015-03-02 DIAGNOSIS — Z86010 Personal history of colonic polyps: Secondary | ICD-10-CM

## 2015-03-02 NOTE — Progress Notes (Signed)
No egg or soy allergy No diet pills No issues with past sedation No home 02 use

## 2015-03-22 ENCOUNTER — Telehealth: Payer: Self-pay | Admitting: Internal Medicine

## 2015-03-22 NOTE — Telephone Encounter (Signed)
Ok No charge for cancellation

## 2015-03-23 ENCOUNTER — Encounter: Payer: PRIVATE HEALTH INSURANCE | Admitting: Internal Medicine

## 2015-04-12 DIAGNOSIS — Z23 Encounter for immunization: Secondary | ICD-10-CM | POA: Diagnosis not present

## 2015-11-16 DIAGNOSIS — H2513 Age-related nuclear cataract, bilateral: Secondary | ICD-10-CM | POA: Diagnosis not present

## 2015-11-16 DIAGNOSIS — Z0100 Encounter for examination of eyes and vision without abnormal findings: Secondary | ICD-10-CM | POA: Diagnosis not present

## 2015-11-25 ENCOUNTER — Ambulatory Visit (HOSPITAL_COMMUNITY)
Admission: RE | Admit: 2015-11-25 | Discharge: 2015-11-25 | Disposition: A | Discharge status: Home / Self Care | Payer: Medicare Other | Source: Ambulatory Visit | Attending: Cardiovascular Disease | Admitting: Cardiovascular Disease

## 2015-11-25 DIAGNOSIS — I352 Nonrheumatic aortic (valve) stenosis with insufficiency: Secondary | ICD-10-CM | POA: Diagnosis not present

## 2015-11-25 DIAGNOSIS — I10 Essential (primary) hypertension: Secondary | ICD-10-CM | POA: Diagnosis not present

## 2015-11-25 DIAGNOSIS — I350 Nonrheumatic aortic (valve) stenosis: Secondary | ICD-10-CM | POA: Insufficient documentation

## 2015-11-25 DIAGNOSIS — Z951 Presence of aortocoronary bypass graft: Secondary | ICD-10-CM | POA: Insufficient documentation

## 2015-11-25 DIAGNOSIS — Z720 Tobacco use: Secondary | ICD-10-CM | POA: Diagnosis not present

## 2015-11-25 DIAGNOSIS — I2510 Atherosclerotic heart disease of native coronary artery without angina pectoris: Secondary | ICD-10-CM | POA: Diagnosis not present

## 2015-11-25 DIAGNOSIS — I119 Hypertensive heart disease without heart failure: Secondary | ICD-10-CM | POA: Diagnosis not present

## 2015-11-25 DIAGNOSIS — I6523 Occlusion and stenosis of bilateral carotid arteries: Secondary | ICD-10-CM

## 2015-11-25 DIAGNOSIS — I340 Nonrheumatic mitral (valve) insufficiency: Secondary | ICD-10-CM | POA: Insufficient documentation

## 2015-11-25 DIAGNOSIS — E785 Hyperlipidemia, unspecified: Secondary | ICD-10-CM | POA: Diagnosis not present

## 2015-11-25 DIAGNOSIS — I359 Nonrheumatic aortic valve disorder, unspecified: Secondary | ICD-10-CM | POA: Diagnosis present

## 2015-12-03 ENCOUNTER — Telehealth: Payer: Self-pay | Admitting: Cardiovascular Disease

## 2015-12-03 NOTE — Telephone Encounter (Signed)
New message   She has question for rn about his recent condition (pt Wife verbalized that Ander Purpura is aware and do not wish to disclose) and them traveling to a wedding

## 2015-12-03 NOTE — Telephone Encounter (Signed)
Left message on machine for pt's wife to contact the office.   

## 2015-12-06 NOTE — Telephone Encounter (Signed)
Left message on machine for pt's wife to contact the office.   

## 2015-12-07 NOTE — Telephone Encounter (Signed)
No return phone call at this time. I will close this encounter. The pt has a pending appointment with Dr Burt Knack on 12/13/15.

## 2015-12-11 ENCOUNTER — Other Ambulatory Visit: Payer: Self-pay | Admitting: Family Medicine

## 2015-12-13 ENCOUNTER — Encounter: Payer: Self-pay | Admitting: Cardiovascular Disease

## 2015-12-13 ENCOUNTER — Ambulatory Visit (INDEPENDENT_AMBULATORY_CARE_PROVIDER_SITE_OTHER): Payer: Medicare Other | Admitting: Cardiovascular Disease

## 2015-12-13 VITALS — BP 150/60 | HR 96 | Ht 66.75 in | Wt 137.4 lb

## 2015-12-13 DIAGNOSIS — I6523 Occlusion and stenosis of bilateral carotid arteries: Secondary | ICD-10-CM | POA: Diagnosis not present

## 2015-12-13 DIAGNOSIS — I350 Nonrheumatic aortic (valve) stenosis: Secondary | ICD-10-CM

## 2015-12-13 MED ORDER — ATORVASTATIN CALCIUM 40 MG PO TABS: 40.0000 mg | ORAL_TABLET | Freq: Every day | ORAL | Status: DC

## 2015-12-13 MED ORDER — CILOSTAZOL 100 MG PO TABS: 100.0000 mg | ORAL_TABLET | Freq: Every day | ORAL | Status: DC

## 2015-12-13 MED ORDER — IRBESARTAN 150 MG PO TABS: 150.0000 mg | ORAL_TABLET | Freq: Every day | ORAL | Status: DC

## 2015-12-13 NOTE — Patient Instructions (Signed)
Medication Instructions:  Your physician recommends that you continue on your current medications as directed. Please refer to the Current Medication list given to you today.  Labwork: No new orders.   Testing/Procedures: Your physician has requested that you have an exercise tolerance test with Dr Burt Knack for Aortic Stenosis. For further information please visit [email or web address]. Please also follow instruction sheet, as given.  Your physician has requested that you have an echocardiogram in 1 YEAR. Echocardiography is a painless test that uses sound waves to create images of your heart. It provides your doctor with information about the size and shape of your heart and how well your heart's chambers and valves are working. This procedure takes approximately one hour. There are no restrictions for this procedure.  Follow-Up: Your physician wants you to follow-up in: 1 YEAR with Dr Burt Knack.  You will receive a reminder letter in the mail two months in advance. If you don't receive a letter, please call our office to schedule the follow-up appointment.  Any Other Special Instructions Will Be Listed Below (If Applicable).     If you need a refill on your cardiac medications before your next appointment, please call your pharmacy.

## 2015-12-13 NOTE — Progress Notes (Signed)
Cardiology Office Note Date:  12/13/2015   ID:  Brett [name], DOB February 18, 1941, MRN [id number]  PCP:  Garret Reddish, MD  Cardiologist:  Sherren Mocha, MD    Chief Complaint  Patient presents with  . carotid stenosis  . essential hypertension  . Aortic Stenosis    moderate  . Coronary Artery Disease    artery bypass graft     History of Present Illness: Brett [name] is a 75 y.o. male who presents for follow-up of aortic stenosis, coronary artery disease s/p CABG, and carotid disease. The patient underwent multivessel CABG 11 years ago. He has done well from a cardiac perspective and is not had any symptoms since I have been following him over the last several years. He comes in today after his most recent echo shows progressive aortic stenosis, now in the severe range. His wife is with him today.  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 He is very active and has no symptoms with physical exertion. He walks over 8000 steps most days. He specifically denies chest pain, chest pressure, shortness of breath, lightheadedness, or syncope. He's had no heart palpitations, but he does feel an irregular pulse at times. He continues to smoke cigarettes.  Past Medical History  Diagnosis Date  . CAD (coronary artery disease) of artery bypass graft   . Hypertension   . Hyperlipidemia   . PVD (peripheral vascular disease) ([place])   . Carotid stenosis   . Hyperglycemia   . Hyperplastic colon polyp 11/24/2009    x7    Past Surgical History  Procedure Laterality Date  . Coronary artery bypass graft  2006    Current Outpatient Prescriptions  Medication Sig Dispense Refill  . Ascorbic Acid (VITAMIN C) 500 MG tablet Take 500 mg by mouth daily.      . Calcium Carbonate-Vitamin D (CALTRATE 600+D) 600-400 MG-UNIT per tablet Take 1 tablet by mouth daily.      . cilostazol (PLETAL) 100 MG tablet Take 1 tablet (100 mg total) by mouth daily. 90 tablet 3  . irbesartan  (AVAPRO) 150 MG tablet Take 1 tablet (150 mg total) by mouth at bedtime. 90 tablet 3  . Multiple Vitamins-Minerals (DIASENSE MULTIVITAMIN) TABS Take 1 tablet by mouth.      . niacin 500 MG tablet Take 500 mg by mouth at bedtime.    . nitroGLYCERIN (NITROSTAT) 0.3 MG SL tablet Place 1 tablet (0.3 mg total) under the tongue as needed. One under tongue as needed chest pain 90 tablet 0  . atorvastatin (LIPITOR) 40 MG tablet Take 1 tablet (40 mg total) by mouth daily. 90 tablet 3   No current facility-administered medications for this visit.    Allergies:   Review of patient's allergies indicates no known allergies.   Social History:  The patient  reports that he has been smoking Cigarettes.  He has a 40 pack-year smoking history. He has never used smokeless tobacco. He reports that he drinks alcohol. He reports that he does not use illicit drugs.   Family History:  The patient's  family history includes Dementia in his mother; Heart attack in his father. There is no history of Colon cancer, Colon polyps, Rectal cancer, or Stomach cancer.   ROS:  Please see the history of present illness.  Otherwise, review of systems is positive for generalized fatigue.  All other systems are reviewed and negative.   PHYSICAL EXAM: VS:  BP 150/60 mmHg  Pulse 96  Ht 5' 6.75" (1.695 m)  Wt 137 lb 6.4 oz (62.324 kg)  BMI 21.69 kg/m2 , BMI Body mass index is 21.69 kg/(m^2). GEN: Well nourished, well developed, in no acute distress HEENT: normal Neck: no JVD, no masses. bilateral carotid bruits Cardiac: irregular with 3/6 harsh late peaking systolic murmur at the RUSB             Respiratory:  clear to auscultation bilaterally, normal work of breathing GI: soft, nontender, nondistended, + BS MS: no deformity or atrophy Ext: no pretibial edema Skin: warm and dry, no rash Neuro:  Strength and sensation are intact Psych: euthymic mood, full affect  EKG:  EKG is ordered today. The ekg ordered today shows sinus  rhythm with frequent PVCs, also with PACs. ST/T-wave abnormality consider inferior ischemia  Recent Labs: No results found for requested labs within last 365 days.   Lipid Panel     Component Value Date/Time   CHOL 101 12/22/2013 1003   TRIG 43.0 12/22/2013 1003   TRIG 93 06/25/2006 0901   HDL 45.40 12/22/2013 1003   CHOLHDL 2 12/22/2013 1003   CHOLHDL 3.9 CALC 06/25/2006 0901   VLDL 8.6 12/22/2013 1003   LDLCALC 47 12/22/2013 1003      Wt Readings from Last 3 Encounters:  12/13/15 137 lb 6.4 oz (62.324 kg)  03/02/15 134 lb 3.2 oz (60.873 kg)  12/28/14 139 lb 3.2 oz (63.141 kg)     Cardiac Studies Reviewed: Carotid Duplex 11/26/2015: <40% stenosis on the right, 0000000 stenosis LICA  2D Echo 0000000: Study Conclusions  - Left ventricle: The cavity size was normal. There was mild focal  basal hypertrophy of the septum. Systolic function was vigorous.  The estimated ejection fraction was in the range of 65% to 70%.  Wall motion was normal; there were no regional wall motion  abnormalities. Doppler parameters are consistent with abnormal  left ventricular relaxation (grade 1 diastolic dysfunction). - Aortic valve: Valve mobility was restricted. There was severe  stenosis. There was trivial regurgitation. - Mitral valve: Severely calcified annulus. There was systolic  anterior motion of the chordal structures. There was mild  regurgitation. - Left atrium: The atrium was moderately dilated. - Atrial septum: There was an atrial septal aneurysm.  Impressions:  - Vigorous LV function; grade 1 diastolic dysfunction; proximal  septal thickening with chordal SAM and resting LVOT gradient of  2.5 m/s; heavily calcificed aortic valve with severe AS (mean  gradient 42 mmHg) and trace AI; severe MAC with mild MR; moderate  LAE.  ASSESSMENT AND PLAN: 1.  Severe aortic stenosis: Pt with Stage C, severe, asymptomatic aortic stenosis. I have reviewed the natural  history of aortic stenosis with the patient and his wife who is present today. We have discussed the limitations of medical therapy and the poor prognosis associated with symptomatic aortic stenosis. We have reviewed potential treatment options, including palliative medical therapy, conventional surgical aortic valve replacement, and transcatheter aortic valve replacement. We discussed treatment options in the context of this patient's specific comorbid medical conditions. His echo images are personally reviewed. He also has asymmetric basal septal hypertrophy and LV outflow obstruction. At this point, I've recommended an exercise treadmill study to rule out high-risk features of hypotension, EKG changes, and to exclude symptoms. If he does well on his treadmill, we will continue with annual echo studies and close clinical follow-up.  2. Carotid stenosis: no hx of stroke. Continue medical management.  3. CAD, native vessel: no angina. Continue medical  Rx.  4. Tobacco: smoking cessation counseling done.  5. Hyperlipidemia: continue a statin drug, labs reviewed as above.  Current medicines are reviewed with the patient today.  The patient does not have concerns regarding medicines.  Labs/ tests ordered today include:   Orders Placed This Encounter  Procedures  . Exercise Tolerance Test  . EKG 12-Lead  . Echocardiogram    Disposition:   FU 6 months  Signed, Sherren Mocha, MD  12/13/2015 1:26 PM    [facility] [place], [place],   [address] Phone: [phone]; Fax: [phone]

## 2016-01-18 ENCOUNTER — Encounter: Payer: Medicare Other | Admitting: Cardiovascular Disease

## 2016-01-18 ENCOUNTER — Ambulatory Visit (INDEPENDENT_AMBULATORY_CARE_PROVIDER_SITE_OTHER): Payer: Medicare Other

## 2016-01-18 DIAGNOSIS — I350 Nonrheumatic aortic (valve) stenosis: Secondary | ICD-10-CM

## 2016-01-18 NOTE — Patient Instructions (Signed)

## 2016-01-19 LAB — EXERCISE TOLERANCE TEST
CHL CUP MPHR: 146 {beats}/min
CHL CUP RESTING HR STRESS: 100 {beats}/min
CSEPEDS: 42 s
Estimated workload: 7 METS
Exercise duration (min): 5 min
Peak HR: 151 {beats}/min
Percent HR: 103 %
RPE: 15

## 2016-01-20 ENCOUNTER — Ambulatory Visit: Payer: Medicare Other | Admitting: Cardiovascular Disease

## 2016-02-05 ENCOUNTER — Other Ambulatory Visit: Payer: Self-pay | Admitting: Internal Medicine

## 2016-02-09 NOTE — Telephone Encounter (Signed)
Has not been seen since 2015- can give #30 if still patient here but needs visit within that time frame

## 2016-02-10 NOTE — Telephone Encounter (Signed)
Left a voicemail message asking for return phone call

## 2016-02-11 ENCOUNTER — Other Ambulatory Visit: Payer: Self-pay

## 2016-02-11 MED ORDER — IRBESARTAN 150 MG PO TABS: 150.0000 mg | ORAL_TABLET | Freq: Every day | ORAL | Status: DC

## 2016-02-11 NOTE — Telephone Encounter (Signed)
Spoke with patient and scheduled appointment in August (first available). Refill sent in to last until appointment. Patient verbalized understanding.

## 2016-02-14 ENCOUNTER — Other Ambulatory Visit: Payer: Self-pay | Admitting: Family Medicine

## 2016-03-16 ENCOUNTER — Encounter: Payer: Self-pay | Admitting: Family Medicine

## 2016-03-16 ENCOUNTER — Ambulatory Visit (INDEPENDENT_AMBULATORY_CARE_PROVIDER_SITE_OTHER): Payer: Medicare Other | Admitting: Family Medicine

## 2016-03-16 VITALS — BP 128/56 | HR 87 | Temp 97.8°F | Ht 66.25 in | Wt 143.6 lb

## 2016-03-16 DIAGNOSIS — R972 Elevated prostate specific antigen [PSA]: Secondary | ICD-10-CM | POA: Diagnosis not present

## 2016-03-16 DIAGNOSIS — E785 Hyperlipidemia, unspecified: Secondary | ICD-10-CM

## 2016-03-16 DIAGNOSIS — Z0000 Encounter for general adult medical examination without abnormal findings: Secondary | ICD-10-CM | POA: Diagnosis not present

## 2016-03-16 DIAGNOSIS — Z23 Encounter for immunization: Secondary | ICD-10-CM

## 2016-03-16 DIAGNOSIS — I10 Essential (primary) hypertension: Secondary | ICD-10-CM | POA: Diagnosis not present

## 2016-03-16 DIAGNOSIS — I25810 Atherosclerosis of coronary artery bypass graft(s) without angina pectoris: Secondary | ICD-10-CM | POA: Diagnosis not present

## 2016-03-16 DIAGNOSIS — R911 Solitary pulmonary nodule: Secondary | ICD-10-CM

## 2016-03-16 DIAGNOSIS — R319 Hematuria, unspecified: Secondary | ICD-10-CM | POA: Diagnosis not present

## 2016-03-16 DIAGNOSIS — F17200 Nicotine dependence, unspecified, uncomplicated: Secondary | ICD-10-CM

## 2016-03-16 NOTE — Progress Notes (Signed)
Phone: [phone]  Subjective:  Patient presents today for their annual wellness visit.    Preventive Screening-Counseling & Management  Smoking Status: Former Smoker- quit 3 months ago Second Hand Smoking status: No smokers in home because wife quit  Risk Factors Regular exercise: active in yard Diet: reasonable- gaining weigh  Fall Risk: None  Fall Risk  03/16/2016  Falls in the past year? No    Cardiac risk factors:  advanced age (older than 107 for men, 7 for women)  Hyperlipidemia - on statin No diabetes.  Hypertension controlled   Depression Screen None. PHQ2 0  Depression screen PHQ 2/9 03/16/2016  Decreased Interest 0  Down, Depressed, Hopeless 0  PHQ - 2 Score 0    Activities of Daily Living Independent ADLs and IADLs   Hearing Difficulties: -patient declines  Cognitive Testing No reported trouble.   Normal 3 word recall  List the Names of Other Physician/Practitioners you currently use: -Dr. Burt Knack cardiology -no other physicians  Immunization History  Administered Date(s) Administered  . Influenza Split 05/12/2012  . Influenza Whole 07/24/2007, 05/14/2008, 05/17/2009  . Influenza-Unspecified 05/19/2014  . Pneumococcal Conjugate-13 03/16/2016  . Pneumococcal Polysaccharide-23 07/24/2007  . Td 07/09/2006  . Zoster 06/12/2014   Required Immunizations needed today : final pneumonia shot- prevnar 13 today  Screening tests- up to date Health Maintenance Due  Topic Date Due  . PNA vac Low Risk Adult (2 of 2 - PCV13)- today 07/23/2008  . COLONOSCOPY - printed letter for him to call to reschedule 11/25/2014  . INFLUENZA VACCINE - advised to get when available 03/07/2016    ROS- No pertinent positives discovered in course of AWV Pertinent ROS- No chest pain or shortness of breath. No headache or blurry vision.    The following were reviewed and entered/updated in epic: Past Medical History:  Diagnosis Date  . CAD (coronary artery  disease) of artery bypass graft   . Carotid stenosis   . Hyperglycemia   . Hyperlipidemia   . Hyperplastic colon polyp 11/24/2009   x7  . Hypertension   . PVD (peripheral vascular disease) [facility])    Patient Active Problem List   Diagnosis Date Noted  . Elevated PSA 03/17/2016    Priority: High  . Pulmonary nodule 06/12/2014    Priority: High  . TOBACCO ABUSE 07/26/2009    Priority: High  . CAD, ARTERY BYPASS GRAFT 07/26/2009    Priority: High  . CAROTID STENOSIS 07/26/2009    Priority: High  . Peripheral vascular disease ([place]) 07/26/2009    Priority: High  . Aortic valve stenosis, moderate 08/15/2011    Priority: Medium  . Hyperlipidemia 11/22/2007    Priority: Medium  . Essential hypertension 02/05/2007    Priority: Medium  . 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 CORONARY ARTERY BYPASS GRAFT  2006    Family History  Problem Relation Age of Onset  . Heart attack Father   . Dementia Mother   . Colon cancer Neg Hx   . Colon polyps Neg Hx   . Rectal cancer Neg Hx   . Stomach cancer Neg Hx     Medications- reviewed and updated Current Outpatient Prescriptions  Medication Sig Dispense Refill  . Ascorbic Acid (VITAMIN C) 500 MG tablet Take 500 mg by mouth daily.      Marland Kitchen atorvastatin (LIPITOR) 40 MG tablet Take 1 tablet (40 mg total) by mouth daily. 90 tablet 3  . Calcium  Carbonate-Vitamin D (CALTRATE 600+D) 600-400 MG-UNIT per tablet Take 1 tablet by mouth daily.      . cilostazol (PLETAL) 100 MG tablet Take 1 tablet (100 mg total) by mouth daily. 90 tablet 3  . irbesartan (AVAPRO) 150 MG tablet Take 1 tablet (150 mg total) by mouth at bedtime. 90 tablet 0  . Multiple Vitamins-Minerals (DIASENSE MULTIVITAMIN) TABS Take 1 tablet by mouth.      . niacin (NIASPAN) 1000 MG CR tablet TAKE 1 TABLET AT BEDTIME 90 tablet 3   No current facility-administered medications for this visit.     Allergies-reviewed and  updated No Known Allergies  Social History   Social History  . Marital status: Married    Spouse name: N/A  . Number of children: 3  . Years of education: N/A   Occupational History  . Retired    Social History Main Topics  . Smoking status: Current Every Day Smoker    Packs/day: 1.00    Years: 40.00    Types: Cigarettes  . Smokeless tobacco: Never Used  . Alcohol use 0.0 oz/week     Comment: occasionaly  . Drug use: No  . Sexual activity: Not Asked   Other Topics Concern  . None   Social History Narrative   Married 1988 2nd marriage. 1 child from 2nd marriage (1994). 2 children from first marriage. 3-4 grandkids-broken relationship with his son though.       Retired from [employer] after 36 years in 1999. 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       Hobbies: gardening, formerly bowling and pool, family time    Objective: BP (!) 128/56 (BP Location: Left Arm, Patient Position: Sitting, Cuff Size: Normal)   Pulse 87   Temp 97.8 F (36.6 C) (Oral)   Ht 5' 6.25" (1.683 m)   Wt 143 lb 9.6 oz (65.1 kg)   SpO2 95%   BMI 23.00 kg/m  Gen: NAD, resting comfortably HEENT: Mucous membranes are moist. Oropharynx normal and Mucous membranes are moist. Neck: no thyromegaly CV: RRR no murmurs rubs or gallops Lungs: CTAB no crackles, wheeze, rhonchi Abdomen: soft/nontender/nondistended/normal bowel sounds. No rebound or guarding.  Rectal: normal tone, very large  prostate, no masses or tenderness Ext: no edema Skin: warm, dry Neuro: grossly normal, moves all extremities, PERRLA  Assessment/Plan:  AWV completed- discussed recommended screenings anddocumented any personalized health advice and referrals for preventive counseling. See AVS as well which was given to patient.   Status of chronic or acute concerns   Pulmonary nodule S: noted 2015 with 6 month follow up advised- has not returned to care until this time A/P: plan is to get CT chest low-dose  screening follow-up. Unsure if qualifies for appropriate counseling required for coverage so will refer to pulmonary for this. Already had nodule previously though so will have to order one way or the other.   TOBACCO ABUSE S:quit smoking may 17th using nicoderm- him and his wife.  A/P: praised efforts and counseled to remain off   Elevated PSA S: noted 2015 with no comparison at 5.47.  Nocturia 3-4 x a night, though not worsening.  Lab Results  Component Value Date   PSA 5.47 (H) 06/08/2014  A/P: plan was 6 month follow up but did not return for follow up. Today, prostate very large on exam and suspect BPH as cause but will return for future fasting labs to include PSA. No longer losing weight so that as sign of  systemic illness less of a concern  Unintentional weight loss Has resolved. Will monitor in future if recurs.  Hyperlipidemia S: well  controlled on atorvastatin 40mg  and niacin in past. No myalgias.  Lab Results  Component Value Date   CHOL 101 12/22/2013   HDL 45.40 12/22/2013   LDLCALC 47 12/22/2013   TRIG 43.0 12/22/2013   CHOLHDL 2 12/22/2013   A/P: update lipids future fasting labs, goal LDL <70   Essential hypertension S: controlled on irbesartan 150mg  alone- prior on hctz (unclear when stopped) BP Readings from Last 3 Encounters:  03/16/16 (!) 128/56  12/13/15 (!) 150/60  12/28/14 116/60  A/P:Continue current meds:  As long as controlled   CAD, ARTERY BYPASS GRAFT S: asymptomatic and also has severe aortic stenosis. Also has PVD on pletal A/P: continue aspirin (added back to list), atorvastatin, pletal    No Follow-up on file.  Orders Placed This Encounter  Procedures  . Pneumococcal conjugate vaccine 13-valent IM  . Comprehensive metabolic panel    [place]    Standing Status:   Future    Standing Expiration Date:   03/16/2017    Order Specific Question:   Has the patient fasted?    Answer:   No  . CBC    [place]    Standing Status:   Future     Standing Expiration Date:   03/16/2017  . Lipid panel    [place]    Standing Status:   Future    Standing Expiration Date:   03/16/2017    Order Specific Question:   Has the patient fasted?    Answer:   No  . PSA    Standing Status:   Future    Standing Expiration Date:   03/16/2017  . Ambulatory Referral for Lung Cancer Scre    Referral Priority:   Routine    Referral Type:   Consultation    Referral Reason:   Specialty Services Required    Number of Visits Requested:   1  . POCT Urinalysis Dipstick (Automated)    Standing Status:   Future    Standing Expiration Date:   03/16/2017    Meds ordered this encounter  Medications  . aspirin EC 81 MG tablet    Sig: Take 81 mg by mouth daily.    Return precautions advised.  Garret Reddish, MD

## 2016-03-16 NOTE — Progress Notes (Signed)
Pre visit review using our clinic review tool, if applicable. No additional management support is needed unless otherwise documented below in the visit note. 

## 2016-03-16 NOTE — Patient Instructions (Addendum)
  [name] , Thank you for taking time to come for your Medicare Wellness Visit. I appreciate your ongoing commitment to your health goals. Please review the following plan we discussed and let me know if I can assist you in the future.   These are the goals we discussed: 1. Prevnar 13 today 2. Please call to schedule your colonoscopy 3. Schedule a lab visit at the check out desk before you leave - for tomorrow. Return for future fasting labs meaning nothing but water after midnight please. Ok to take your medications with water.  4. We will call you within a week about your referral for CT scan. If you do not hear within 2 weeks, give [place] a call.      This is a list of the screening recommended for you and due dates:  Health Maintenance  Topic Date Due  . Pneumonia vaccines (2 of 2 - PCV13) 07/23/2008  . Colon Cancer Screening  11/25/2014  . Flu Shot  03/07/2016  . Tetanus Vaccine  07/09/2016  . Shingles Vaccine  Completed

## 2016-03-17 ENCOUNTER — Other Ambulatory Visit: Payer: Self-pay

## 2016-03-17 DIAGNOSIS — R319 Hematuria, unspecified: Secondary | ICD-10-CM | POA: Diagnosis not present

## 2016-03-17 DIAGNOSIS — N200 Calculus of kidney: Secondary | ICD-10-CM | POA: Insufficient documentation

## 2016-03-17 DIAGNOSIS — R972 Elevated prostate specific antigen [PSA]: Secondary | ICD-10-CM

## 2016-03-17 LAB — CBC
HCT: 39.9 % (ref 39.0–52.0)
HEMOGLOBIN: 13.8 g/dL (ref 13.0–17.0)
MCHC: 34.6 g/dL (ref 30.0–36.0)
MCV: 98.4 fl (ref 78.0–100.0)
PLATELETS: 261 10*3/uL (ref 150.0–400.0)
RBC: 4.05 Mil/uL — AB (ref 4.22–5.81)
RDW: 14.4 % (ref 11.5–15.5)
WBC: 11.4 10*3/uL — ABNORMAL HIGH (ref 4.0–10.5)

## 2016-03-17 LAB — POC URINALSYSI DIPSTICK (AUTOMATED)
BILIRUBIN UA: NEGATIVE
GLUCOSE UA: NEGATIVE
KETONES UA: NEGATIVE
Nitrite, UA: POSITIVE
Protein, UA: NEGATIVE
SPEC GRAV UA: 1.015
Urobilinogen, UA: 0.2
pH, UA: 6.5

## 2016-03-17 LAB — COMPREHENSIVE METABOLIC PANEL
ALK PHOS: 76 U/L (ref 39–117)
ALT: 11 U/L (ref 0–53)
AST: 18 U/L (ref 0–37)
Albumin: 4.3 g/dL (ref 3.5–5.2)
BILIRUBIN TOTAL: 1.2 mg/dL (ref 0.2–1.2)
BUN: 19 mg/dL (ref 6–23)
CALCIUM: 9.6 mg/dL (ref 8.4–10.5)
CO2: 29 meq/L (ref 19–32)
Chloride: 104 mEq/L (ref 96–112)
Creatinine, Ser: 1.1 mg/dL (ref 0.40–1.50)
GFR: 69.41 mL/min (ref >60.00)
Glucose, Bld: 105 mg/dL — ABNORMAL HIGH (ref 70–99)
POTASSIUM: 4.3 meq/L (ref 3.5–5.1)
Sodium: 140 mEq/L (ref 135–145)
TOTAL PROTEIN: 6.6 g/dL (ref 6.0–8.3)

## 2016-03-17 LAB — LIPID PANEL
CHOL/HDL RATIO: 2
Cholesterol: 128 mg/dL (ref 0–200)
HDL: 52.2 mg/dL (ref >39.00)
LDL CALC: 60 mg/dL (ref 0–99)
NONHDL: 75.8
TRIGLYCERIDES: 80 mg/dL (ref 0.0–149.0)
VLDL: 16 mg/dL (ref 0.0–40.0)

## 2016-03-17 LAB — URINALYSIS, MICROSCOPIC ONLY

## 2016-03-17 LAB — PSA: PSA: 9.59 ng/mL — ABNORMAL HIGH (ref 0.10–4.00)

## 2016-03-17 NOTE — Assessment & Plan Note (Signed)
S: noted 2015 with 6 month follow up advised- has not returned to care until this time A/P: plan is to get CT chest low-dose screening follow-up. Unsure if qualifies for appropriate counseling required for coverage so will refer to pulmonary for this. Already had nodule previously though so will have to order one way or the other.

## 2016-03-17 NOTE — Addendum Note (Signed)
Addended by: Elmer Picker on: 03/17/2016 07:56 AM   Modules accepted: Orders

## 2016-03-17 NOTE — Assessment & Plan Note (Signed)
Has resolved. Will monitor in future if recurs.

## 2016-03-17 NOTE — Addendum Note (Signed)
Addended by: Elmer Picker on: 03/17/2016 10:39 AM   Modules accepted: Orders

## 2016-03-17 NOTE — Assessment & Plan Note (Signed)
S:quit smoking may 17th using nicoderm- him and his wife.  A/P: praised efforts and counseled to remain off

## 2016-03-17 NOTE — Assessment & Plan Note (Signed)
S: noted 2015 with no comparison at 5.47.  Nocturia 3-4 x a night, though not worsening.  Lab Results  Component Value Date   PSA 5.47 (H) 06/08/2014  A/P: plan was 6 month follow up but did not return for follow up. Today, prostate very large on exam and suspect BPH as cause but will return for future fasting labs to include PSA. No longer losing weight so that as sign of systemic illness less of a concern

## 2016-03-17 NOTE — Assessment & Plan Note (Signed)
S: asymptomatic and also has severe aortic stenosis. Also has PVD on pletal A/P: continue aspirin (added back to list), atorvastatin, pletal

## 2016-03-17 NOTE — Assessment & Plan Note (Signed)
S: controlled on irbesartan 150mg  alone- prior on hctz (unclear when stopped) BP Readings from Last 3 Encounters:  03/16/16 (!) 128/56  12/13/15 (!) 150/60  12/28/14 116/60  A/P:Continue current meds:  As long as controlled

## 2016-03-17 NOTE — Assessment & Plan Note (Signed)
S: well  controlled on atorvastatin 40mg  and niacin in past. No myalgias.  Lab Results  Component Value Date   CHOL 101 12/22/2013   HDL 45.40 12/22/2013   LDLCALC 47 12/22/2013   TRIG 43.0 12/22/2013   CHOLHDL 2 12/22/2013   A/P: update lipids future fasting labs, goal LDL <70

## 2016-03-19 LAB — URINE CULTURE

## 2016-03-23 ENCOUNTER — Telehealth: Payer: Self-pay | Admitting: Acute Care

## 2016-03-23 DIAGNOSIS — Z87891 Personal history of nicotine dependence: Secondary | ICD-10-CM

## 2016-03-23 NOTE — Telephone Encounter (Signed)
[phone] pt calling back

## 2016-03-27 NOTE — Telephone Encounter (Signed)
Pt calling again about lung screening please advise and can be reached @ [phone].Brett [name]

## 2016-03-28 NOTE — Telephone Encounter (Signed)
Called spoke with pt. Reviewed lung cancer screening program. Scheduled [facility] on 04/06/16 at 9:30am. CT order placed. Pt voiced understanding and had no further questions. Nothing further needed.

## 2016-03-28 NOTE — Telephone Encounter (Signed)
Pt returning a call to [facility] for the lung cancer screening.

## 2016-04-06 ENCOUNTER — Encounter: Payer: Self-pay | Admitting: Acute Care

## 2016-04-06 ENCOUNTER — Ambulatory Visit (INDEPENDENT_AMBULATORY_CARE_PROVIDER_SITE_OTHER)
Admission: RE | Admit: 2016-04-06 | Discharge: 2016-04-06 | Disposition: A | Discharge status: Home / Self Care | Payer: Medicare Other | Source: Ambulatory Visit | Attending: Acute Care | Admitting: Acute Care

## 2016-04-06 ENCOUNTER — Ambulatory Visit (INDEPENDENT_AMBULATORY_CARE_PROVIDER_SITE_OTHER): Payer: Medicare Other | Admitting: Acute Care

## 2016-04-06 DIAGNOSIS — Z87891 Personal history of nicotine dependence: Secondary | ICD-10-CM

## 2016-04-06 NOTE — Progress Notes (Signed)
Shared Decision Making Visit Lung Cancer Screening Program [phone])   Eligibility:  Age 75 y.o.  Pack Years Smoking History Calculation 40 pack year smoking history (# packs/per year x # years smoked)  Recent History of coughing up blood  No  Unexplained weight loss? no ( >Than 15 pounds within the last 6 months )  Prior History Lung / other cancer no (Diagnosis within the last 5 years already requiring surveillance chest CT Scans).  Smoking Status Former Smoker  Former Smokers: Years since quit: < 1 year  Quit Date: 12/06/2015  Visit Components:  Discussion included one or more decision making aids. yes  Discussion included risk/benefits of screening. yes  Discussion included potential follow up diagnostic testing for abnormal scans. yes  Discussion included meaning and risk of over diagnosis. yes  Discussion included meaning and risk of False Positives. yes  Discussion included meaning of total radiation exposure. yes  Counseling Included:  Importance of adherence to annual lung cancer LDCT screening. yes  Impact of comorbidities on ability to participate in the program. yes  Ability and willingness to under diagnostic treatment. yes  Smoking Cessation Counseling:  Current Smokers:   Discussed importance of smoking cessation. yes  Information about tobacco cessation classes and interventions provided to patient. yes  Patient provided with "ticket" for LDCT Scan. yes  Symptomatic Patient. no  Counseling  Diagnosis Code: Tobacco Use Z72.0  Asymptomatic Patient yes  Counseling NA, former smoker  Former Smokers:   Discussed the importance of maintaining cigarette abstinence. yes  Diagnosis Code: Personal History of Nicotine Dependence. [id number]  Information about tobacco cessation classes and interventions provided to patient. Yes  Patient provided with "ticket" for LDCT Scan. yes  Written Order for Lung Cancer Screening with LDCT placed in Epic.  Yes (CT Chest Lung Cancer Screening Low Dose W/O CM) [id number] Z12.2-Screening of respiratory organs Z87.891-Personal history of nicotine dependence  I spent 20 minutes of face to face time with Mr. Debenedetti and his wife  discussing the risks and benefits of lung cancer screening. We viewed a power point together that explained in detail the above noted topics. We took the time to pause the power point at intervals to allow for questions to be asked and answered to ensure understanding. We discussed that he had taken the single most powerful action possible to decrease his risk of developing lung cancer when he quit smoking. I counseled him to remain smoke free, and to contact me if he ever had the desire to smoke again so that I can provide resources and tools to help support the effort to remain smoke free. We discussed the time and location of the scan, and that either [place] or I will call with the results within  24-48 hours of receiving them. He has my card and contact information in the event he needs to speak with me, in addition to a copy of the power point we reviewed as a resource. He verbalized understanding of all of the above and had no further questions upon leaving the office.    Magdalen Spatz, NP 04/06/2016

## 2016-04-12 ENCOUNTER — Telehealth: Payer: Self-pay | Admitting: Acute Care

## 2016-04-12 DIAGNOSIS — Z87891 Personal history of nicotine dependence: Secondary | ICD-10-CM

## 2016-04-12 NOTE — Telephone Encounter (Signed)
I have called Brett [name] and his wife Brett [name] with the results of his low-dose CT scan. I explained to him that the scan was read as a  Lung RADS 2: indicating nodules that are benign in appearance and behavior with a very low likelihood of becoming a clinically active cancer due to size or lack of growth. Recommendation per radiology is for a repeat LDCT in 12 months. I also explained to him that the scan revealed aortic atherosclerosis and coronary artery calcification. We did discuss this at the time of the shared decision-making visit. The patient is on Lipitor per his primary care  Dr. Rushie Chestnut. I told him that I would fax results of the scan to Dr. Yong Channel and that we would schedule his annual scan for early September 2018. Both he and his wife verbalized understanding of the results of the scan and the follow-up and had no further questions upon completion of the call.

## 2016-04-22 ENCOUNTER — Other Ambulatory Visit: Payer: Self-pay | Admitting: Family Medicine

## 2016-04-22 ENCOUNTER — Other Ambulatory Visit: Payer: Self-pay | Admitting: Internal Medicine

## 2016-05-07 DIAGNOSIS — Z23 Encounter for immunization: Secondary | ICD-10-CM | POA: Diagnosis not present

## 2016-05-22 DIAGNOSIS — N401 Enlarged prostate with lower urinary tract symptoms: Secondary | ICD-10-CM | POA: Diagnosis not present

## 2016-05-22 DIAGNOSIS — R972 Elevated prostate specific antigen [PSA]: Secondary | ICD-10-CM | POA: Diagnosis not present

## 2016-05-22 DIAGNOSIS — R3911 Hesitancy of micturition: Secondary | ICD-10-CM | POA: Diagnosis not present

## 2016-05-31 ENCOUNTER — Telehealth: Payer: Self-pay | Admitting: Cardiovascular Disease

## 2016-05-31 ENCOUNTER — Telehealth: Payer: Self-pay

## 2016-05-31 ENCOUNTER — Encounter: Payer: Self-pay | Admitting: Internal Medicine

## 2016-05-31 ENCOUNTER — Ambulatory Visit (INDEPENDENT_AMBULATORY_CARE_PROVIDER_SITE_OTHER): Payer: Medicare Other | Admitting: Internal Medicine

## 2016-05-31 ENCOUNTER — Encounter (INDEPENDENT_AMBULATORY_CARE_PROVIDER_SITE_OTHER): Payer: Self-pay

## 2016-05-31 VITALS — BP 134/64 | HR 104 | Ht 66.25 in | Wt 144.0 lb

## 2016-05-31 DIAGNOSIS — Z7902 Long term (current) use of antithrombotics/antiplatelets: Secondary | ICD-10-CM

## 2016-05-31 DIAGNOSIS — I6523 Occlusion and stenosis of bilateral carotid arteries: Secondary | ICD-10-CM

## 2016-05-31 DIAGNOSIS — Z86010 Personal history of colonic polyps: Secondary | ICD-10-CM | POA: Diagnosis not present

## 2016-05-31 NOTE — Patient Instructions (Signed)

## 2016-05-31 NOTE — Telephone Encounter (Signed)
-----   Message from Jerene Bears, MD sent at 05/31/2016 12:55 PM EDT ----- Regarding: FW: ASA IV, severe aortic stenosis Brett [name] I was unable of the severe AS at time of today's appt Therefore he needs to be rescheduled for one of my hospital 1/2 days  We need to adjust his Pletal dose and recommendations for discontinuation accordingly. He will have to take off from work probably, but this is the best we can do. Thanks for your help JMP  ----- Message ----- From: Osvaldo Angst, CRNA Sent: 05/31/2016  11:13 AM To: Jerene Bears, MD Subject: ASA IV, severe aortic stenosis                 Doc,  You are scheduled to perform a colonoscopy on this pt 10/30.  Unfortunately, he has severe AS and needs to be done at the hospital.  Thanks,  Osvaldo Angst

## 2016-05-31 NOTE — Telephone Encounter (Signed)
Informed patent that we cannot do his Colonoscopy in our [place] in our building due to his aortic stenosis. Informed patient that we have a few date options when Dr. Hilarie Fredrickson can do his procedure at the hospital if he would like to go over those. Patient states he is about to arrive at work and will call me tomorrow. Informed him to take his Pletal tomorrow since we are cancelling the procedure for 06/05/16.

## 2016-05-31 NOTE — Telephone Encounter (Signed)
  Brett [name] 02-09-1941 [id number]  Dear Dr. Burt Knack:  We have scheduled the above named patient for a(n) Colonscopy procedure. Our records show that (s)he is on anticoagulation therapy.  Please advise as to whether the patient may come of their therapy of Pletal starting tomorrow until the procedure which is scheduled for 07/21/16.  Please route your response to Marlon Pel, CMA or fax response to [phone].  Sincerely,    [facility]

## 2016-05-31 NOTE — Progress Notes (Signed)
Patient ID: JEFTE SEAWOOD, male   DOB: August 06, 1941, 75 y.o.   MRN: [id number] HPI: Brett [name] is a 75 year old male with a past medical history of adenomatous colon polyps, CAD status post CABG, hyperlipidemia, hyperglycemia, hypertension, peripheral vascular disease and recent elevated PSA pending prostate biopsy who is seen to discuss surveillance colonoscopy. This is his first visit with me. He's had 3 prior colonoscopies with Dr. Sharlett Iles the last of which was in 2011. During this exam he had 7 hyperplastic polyps removed from the rectum and sigmoid. Looking at prior history in 2005 he had a 15 mm tubular adenoma removed and this led to the 5 year recall given advanced prior polyp.  He reports recently that he has been feeling well from a GI perspective and denies specific GI complaint. He reports good appetite without heartburn, dysphagia or odynophagia. No abdominal pain. Bowel habits have been mostly regular without blood in his stool or melena. He denies diarrhea or constipation.  He does report increased urinary frequency which eventually led to evaluation. PSA had risen significantly he was referred to see Dr. Alinda Money with urology. He has a prostate biopsy scheduled for 07/28/2016.  He denies recent chest pain and shortness of breath.  He works as a Teacher, early years/pre for [employer]  Past Medical History:  Diagnosis Date  . CAD (coronary artery disease) of artery bypass graft   . Carotid stenosis   . Elevated PSA   . Hyperglycemia   . Hyperlipidemia   . Hyperplastic colon polyp 11/24/2009   x7  . Hypertension   . PVD (peripheral vascular disease) ([place])     Past Surgical History:  Procedure Laterality Date  . CORONARY ARTERY BYPASS GRAFT  2006    Outpatient Medications Prior to Visit  Medication Sig Dispense Refill  . Ascorbic Acid (VITAMIN C) 500 MG tablet Take 500 mg by mouth daily.      Marland Kitchen aspirin EC 81 MG tablet Take 81 mg by mouth daily.    Marland Kitchen atorvastatin  (LIPITOR) 40 MG tablet Take 1 tablet (40 mg total) by mouth daily. 90 tablet 3  . Calcium Carbonate-Vitamin D (CALTRATE 600+D) 600-400 MG-UNIT per tablet Take 1 tablet by mouth daily.      . cilostazol (PLETAL) 100 MG tablet Take 1 tablet (100 mg total) by mouth daily. 90 tablet 3  . irbesartan (AVAPRO) 150 MG tablet TAKE 1 TABLET AT BEDTIME 90 tablet 1  . Multiple Vitamins-Minerals (DIASENSE MULTIVITAMIN) TABS Take 1 tablet by mouth.      . niacin (NIASPAN) 1000 MG CR tablet TAKE 1 TABLET AT BEDTIME 90 tablet 3   No facility-administered medications prior to visit.     No Known Allergies  Family History  Problem Relation Age of Onset  . Heart attack Father   . Dementia Mother   . Colon cancer Neg Hx   . Colon polyps Neg Hx   . Rectal cancer Neg Hx   . Stomach cancer Neg Hx     Social History  Substance Use Topics  . Smoking status: Former Smoker    Packs/day: 1.00    Years: 40.00    Types: Cigarettes  . Smokeless tobacco: Former Systems developer    Quit date: 12/06/2015  . Alcohol use 0.0 oz/week     Comment: occasionaly    ROS: As per history of present illness, otherwise negative  BP 134/64   Pulse (!) 104   Ht 5' 6.25" (1.683 m)   Wt 144 lb (  65.3 kg)   BMI 23.07 kg/m  Constitutional: Well-developed and well-nourished. No distress. HEENT: Normocephalic and atraumatic. Oropharynx is clear and moist. No oropharyngeal exudate. Conjunctivae are normal.  No scleral icterus. Neck: Neck supple. Trachea midline. Cardiovascular: Normal rate, regular rhythm and intact distal pulses. No M/R/G Pulmonary/chest: Effort normal and breath sounds normal. No wheezing, rales or rhonchi. Abdominal: Soft, nontender, nondistended. Bowel sounds active throughout.  Extremities: no clubbing, cyanosis, or edema Neurological: Alert and oriented to person place and time. Skin: Skin is warm and dry.  Psychiatric: Normal mood and affect. Behavior is normal.  RELEVANT LABS AND IMAGING: CBC     Component Value Date/Time   WBC 11.4 (H) 03/17/2016 0756   RBC 4.05 (L) 03/17/2016 0756   HGB 13.8 03/17/2016 0756   HCT 39.9 03/17/2016 0756   PLT 261.0 03/17/2016 0756   MCV 98.4 03/17/2016 0756   MCHC 34.6 03/17/2016 0756   RDW 14.4 03/17/2016 0756   LYMPHSABS 2.5 12/22/2013 1003   MONOABS 0.7 12/22/2013 1003   EOSABS 0.1 12/22/2013 1003   BASOSABS 0.0 12/22/2013 1003    CMP     Component Value Date/Time   NA 140 03/17/2016 0756   K 4.3 03/17/2016 0756   CL 104 03/17/2016 0756   CO2 29 03/17/2016 0756   GLUCOSE 105 (H) 03/17/2016 0756   GLUCOSE 118 (H) 06/25/2006 0901   BUN 19 03/17/2016 0756   CREATININE 1.10 03/17/2016 0756   CALCIUM 9.6 03/17/2016 0756   PROT 6.6 03/17/2016 0756   ALBUMIN 4.3 03/17/2016 0756   AST 18 03/17/2016 0756   ALT 11 03/17/2016 0756   ALKPHOS 76 03/17/2016 0756   BILITOT 1.2 03/17/2016 0756   GFRNONAA 78.81 03/11/2010 0803   GFRAA 86 07/16/2008 0916    ASSESSMENT/PLAN: 75 year old male with a past medical history of adenomatous colon polyps, CAD status post CABG, hyperlipidemia, hyperglycemia, hypertension, peripheral vascular disease and recent elevated PSA pending prostate biopsy who is seen to discuss surveillance colonoscopy.  1. Hx of adenomatous colon polyps -- he had an advanced adenoma, 1.5 cm removed in 2005. Subsequent colonoscopies have revealed left-sided hyperplastic polyps. Due to his history of prior advanced adenoma, I have recommended he continue with 5 year surveillance interval. With this recommendation he is due at this time. We discussed the risks, benefits and alternatives and he wishes to proceed. He requests the test be performed on a holiday for [employer] and it so happens that his schedule and mine will work for this coming Monday. --Hold Pletal 4 days before procedure - will instruct when and how to resume after procedure. Risks and benefits of procedure including bleeding, perforation, infection,  missed lesions, medication reactions and possible hospitalization or surgery if complications occur explained. Additional rare but real risk of cardiovascular event such as heart attack or ischemia/infarct of other organs off Pletal explained and need to seek urgent help if this occurs. Will communicate by phone or EMR with patient's prescribing provider that to confirm holding Pletal is reasonable in this case.      [id number], [place], [place] [id number]

## 2016-05-31 NOTE — Telephone Encounter (Signed)
New message      Request for surgical clearance:  What type of surgery is being performed? colonoscopyWhen is this surgery scheduled? 06-05-16 Are there any medications that need to be held prior to surgery and how long? Stop pletal by thurs 1. Name of physician performing surgery?  Dr Hilarie Fredrickson  What is your office phone and fax number? Send thru epic

## 2016-06-01 ENCOUNTER — Other Ambulatory Visit: Payer: Self-pay

## 2016-06-01 DIAGNOSIS — Z86010 Personal history of colonic polyps: Secondary | ICD-10-CM

## 2016-06-01 NOTE — Telephone Encounter (Signed)
Patient has been rescheduled until 07/21/16 at [facility] due to severe AS. Please evaluate if patient can hold Pletal 4 days prior to Colonoscopy. Thanks Dr. Burt Knack.

## 2016-06-01 NOTE — Telephone Encounter (Signed)
Informed patient per Dr. Burt Knack he can hold Pletal 4 days prior to his procedure. Patient verbalized understanding.

## 2016-06-01 NOTE — Telephone Encounter (Signed)
Fine to hold pletal. thanks

## 2016-06-01 NOTE — Telephone Encounter (Signed)
Scheduled patient's Colonoscopy for 07/21/16 at 11:15am. Printed new instructions and mailed them to patient. Will call patient after we get the answer back from Dr. Burt Knack abdout the Pletal.

## 2016-06-01 NOTE — Telephone Encounter (Signed)
Spoke with patient and offered Colonoscopy date options such as 11/28, 12/15 at [facility]. Patient states he would like 07/21/16. Informed him that I will call the hospital and schedule and mail him new Colonoscopy instructions with the changes. Also, I will am waiting on clearance regarding his Pletal with Dr. Burt Knack but will call him once I get the answer. Patient verbalized understanding.

## 2016-06-05 ENCOUNTER — Encounter: Payer: Medicare Other | Admitting: Internal Medicine

## 2016-06-06 ENCOUNTER — Ambulatory Visit (INDEPENDENT_AMBULATORY_CARE_PROVIDER_SITE_OTHER): Payer: Medicare Other | Admitting: Family Medicine

## 2016-06-06 ENCOUNTER — Encounter: Payer: Self-pay | Admitting: Family Medicine

## 2016-06-06 ENCOUNTER — Telehealth: Payer: Self-pay

## 2016-06-06 VITALS — BP 122/66 | HR 96 | Temp 98.8°F | Wt 140.8 lb

## 2016-06-06 DIAGNOSIS — R197 Diarrhea, unspecified: Secondary | ICD-10-CM

## 2016-06-06 DIAGNOSIS — R972 Elevated prostate specific antigen [PSA]: Secondary | ICD-10-CM

## 2016-06-06 DIAGNOSIS — R630 Anorexia: Secondary | ICD-10-CM

## 2016-06-06 DIAGNOSIS — I6523 Occlusion and stenosis of bilateral carotid arteries: Secondary | ICD-10-CM | POA: Diagnosis not present

## 2016-06-06 DIAGNOSIS — N3000 Acute cystitis without hematuria: Secondary | ICD-10-CM

## 2016-06-06 DIAGNOSIS — R5383 Other fatigue: Secondary | ICD-10-CM

## 2016-06-06 NOTE — Patient Instructions (Signed)
Symptoms may all be related to medication and side effects.   Continue off macrodantin  Hold HCTZ 12.5 mg for now. Cut irbesartan in half.   Push fluids at least 4 oz every hour while awake (goal around 60 oz a day for 2 days)  Suspect you will feel better with this and diarrrhea will continue to sleo off antibiotic  If new or worsening symptoms see [place] sooner, otherwise follow up on Thursday at 11 30 (schedule before you leave at check out

## 2016-06-06 NOTE — Progress Notes (Signed)
Pre visit review using our clinic review tool, if applicable. No additional management support is needed unless otherwise documented below in the visit note. 

## 2016-06-06 NOTE — Progress Notes (Signed)
Subjective:  Brett [name] is a 75 y.o. year old very pleasant male patient who presents for/with See problem oriented charting ROS- see any ROS included in HPI section  Past Medical History-  Patient Active Problem List   Diagnosis Date Noted  . Elevated PSA 03/17/2016    Priority: High  . Pulmonary nodule 06/12/2014    Priority: High  . TOBACCO ABUSE 07/26/2009    Priority: High  . CAD, ARTERY BYPASS GRAFT 07/26/2009    Priority: High  . CAROTID STENOSIS 07/26/2009    Priority: High  . Peripheral vascular disease ([place]) 07/26/2009    Priority: High  . Aortic valve stenosis, moderate 08/15/2011    Priority: Medium  . Hyperlipidemia 11/22/2007    Priority: Medium  . Essential hypertension 02/05/2007    Priority: Medium  . 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 Ascorbic Acid (VITAMIN C) 500 MG tablet Take 500 mg by mouth daily.      Marland Kitchen aspirin EC 81 MG tablet Take 81 mg by mouth daily.    Marland Kitchen atorvastatin (LIPITOR) 40 MG tablet Take 1 tablet (40 mg total) by mouth daily. 90 tablet 3  . Calcium Carbonate-Vitamin D (CALTRATE 600+D) 600-400 MG-UNIT per tablet Take 1 tablet by mouth daily.      . cilostazol (PLETAL) 100 MG tablet Take 1 tablet (100 mg total) by mouth daily. 90 tablet 3  . irbesartan (AVAPRO) 150 MG tablet TAKE 1 TABLET AT BEDTIME 90 tablet 1  . Multiple Vitamins-Minerals (DIASENSE MULTIVITAMIN) TABS Take 1 tablet by mouth.      . niacin (NIASPAN) 1000 MG CR tablet TAKE 1 TABLET AT BEDTIME 90 tablet 3   Objective: BP 122/66 (BP Location: Left Arm, Patient Position: Sitting, Cuff Size: Normal)   Pulse 96   Temp 98.8 F (37.1 C) (Oral)   Wt 140 lb 12.8 oz (63.9 kg)   SpO2 95%   BMI 22.55 kg/m  Gen: NAD, resting comfortably Tongue moist but lips rather dry (just drank 2 cups of water) CV: RRR 4/6 stable SEM. no rubs or gallops Lungs: CTAB no crackles,  wheeze, rhonchi Abdomen: soft/nontender/nondistended/normal bowel sounds. No rebound or guarding.  Ext: no edema or signs of fluid overload Skin: warm, dry, poor skin turgor  Assessment/Plan:  Loss of appetite Diarrhea Fatigue/lower energy UTI/elevated PSA S:Tired, lost appetite, diarrhea. Lower energy. Started after he started macrodantin for UTI on Friday for UTI. Sleeping a lot more. No nausea but doesn't enjoy smell of foods. Diarrhea 3x a day since Saturday night through Monday but then only 1 loose stool a day since then. Down 4 lbs in 6 days. Plan was for 20 pills, they stopped Sunday night. Has not drastically improved since that time but has had slow improvement. Was not having symptoms UTI. Was seen there due to elevated PSA> and thought was infection could cause elevated PSA. Has lost 4 lbs in about a week.  ROS- no chest pain or shortness of breath. No blood in stool. No vomiting. No fever.  A/P: 75 year old with loss of appetite, diarrhea, lower energy since starting macrodantin for UTI. Symptoms improving since stopping- suspect issues medication related. Patient appears mildly dehydrated on exam which could be cause for symptoms- advised to push fluids - there is some potential risk for fluid overload with aortic valve stenosis but bigger concern is lack of preload. Encouraged 4 oz each  waking hour and reassess in 2 days with sooner if worsening symptoms. BP on lower end and with hydration status will hold hctz 12.5 mg and only take half of irbesartan. In regards to UTI- patient has reached out to urology but not heard back yet- likely when feeling better try alternate course of medication. Update labs if not doing better by 2 day follow up  Return precautions advised.  Garret Reddish, MD

## 2016-06-06 NOTE — Telephone Encounter (Signed)
Call rec'd from spouse States Mr. Anglade started having diarrhea on Sat; Sunday; was on antibiotics and stopped these. States he has not eaten and afraid he was getting dehydrated.  Apt today at 1pm for fup .

## 2016-06-08 ENCOUNTER — Encounter: Payer: Self-pay | Admitting: Family Medicine

## 2016-06-08 ENCOUNTER — Ambulatory Visit (INDEPENDENT_AMBULATORY_CARE_PROVIDER_SITE_OTHER): Payer: Medicare Other | Admitting: Family Medicine

## 2016-06-08 VITALS — BP 104/52 | HR 121 | Temp 97.8°F | Wt 141.4 lb

## 2016-06-08 DIAGNOSIS — I6523 Occlusion and stenosis of bilateral carotid arteries: Secondary | ICD-10-CM

## 2016-06-08 DIAGNOSIS — R000 Tachycardia, unspecified: Secondary | ICD-10-CM | POA: Diagnosis not present

## 2016-06-08 DIAGNOSIS — R5383 Other fatigue: Secondary | ICD-10-CM | POA: Diagnosis not present

## 2016-06-08 LAB — COMPREHENSIVE METABOLIC PANEL
ALBUMIN: 3.8 g/dL (ref 3.5–5.2)
ALT: 18 U/L (ref 0–53)
AST: 22 U/L (ref 0–37)
Alkaline Phosphatase: 80 U/L (ref 39–117)
BUN: 34 mg/dL — ABNORMAL HIGH (ref 6–23)
CALCIUM: 9.6 mg/dL (ref 8.4–10.5)
CHLORIDE: 103 meq/L (ref 96–112)
CO2: 25 mEq/L (ref 19–32)
CREATININE: 1.79 mg/dL — AB (ref 0.40–1.50)
GFR: 39.55 mL/min — ABNORMAL LOW (ref >60.00)
Glucose, Bld: 135 mg/dL — ABNORMAL HIGH (ref 70–99)
Potassium: 4.3 mEq/L (ref 3.5–5.1)
Sodium: 137 mEq/L (ref 135–145)
Total Bilirubin: 0.7 mg/dL (ref 0.2–1.2)
Total Protein: 6.3 g/dL (ref 6.0–8.3)

## 2016-06-08 LAB — CBC WITH DIFFERENTIAL/PLATELET
BASOS PCT: 0.1 % (ref 0.0–3.0)
Basophils Absolute: 0 10*3/uL (ref 0.0–0.1)
EOS ABS: 0.1 10*3/uL (ref 0.0–0.7)
EOS PCT: 0.7 % (ref 0.0–5.0)
HEMATOCRIT: 34.7 % — AB (ref 39.0–52.0)
HEMOGLOBIN: 12.1 g/dL — AB (ref 13.0–17.0)
LYMPHS PCT: 13 % (ref 12.0–46.0)
Lymphs Abs: 1.5 10*3/uL (ref 0.7–4.0)
MCHC: 35.1 g/dL (ref 30.0–36.0)
MCV: 96 fl (ref 78.0–100.0)
MONOS PCT: 7.2 % (ref 3.0–12.0)
Monocytes Absolute: 0.8 10*3/uL (ref 0.1–1.0)
Neutro Abs: 9.2 10*3/uL — ABNORMAL HIGH (ref 1.4–7.7)
Neutrophils Relative %: 79 % — ABNORMAL HIGH (ref 43.0–77.0)
Platelets: 302 10*3/uL (ref 150.0–400.0)
RBC: 3.61 Mil/uL — ABNORMAL LOW (ref 4.22–5.81)
RDW: 13.6 % (ref 11.5–15.5)
WBC: 11.7 10*3/uL — AB (ref 4.0–10.5)

## 2016-06-08 NOTE — Patient Instructions (Signed)
  Keep pushing fluids  You look much better but let's get Labs before you go with heart rate up more than last visit  Hold blood pressure medicine completely at this point (not taking off list yet) BP Readings from Last 3 Encounters:  06/08/16 (!) 104/52  06/06/16 122/66  05/31/16 134/64   Let's reassess in 1 week (find a spot that works for you)- they can use same day slot to help fit your schedule better  See [place] if worsening symptoms  Hopefully by next week when I see you - things look 100% better and can start new antibiotic for the possible urine infection and elevated PSA

## 2016-06-08 NOTE — Progress Notes (Signed)
Subjective:  Brett [name] is a 75 y.o. year old very pleasant male patient who presents for/with See problem oriented charting ROS- no fever, chills, nausea, vomiting. No diarrhea or constipation- no BM since Tuesday in fact. No chest pain or shortness of breath.see any ROS included in HPI as well.   Past Medical History-  Patient Active Problem List   Diagnosis Date Noted  . Elevated PSA 03/17/2016    Priority: High  . Pulmonary nodule 06/12/2014    Priority: High  . TOBACCO ABUSE 07/26/2009    Priority: High  . CAD, ARTERY BYPASS GRAFT 07/26/2009    Priority: High  . CAROTID STENOSIS 07/26/2009    Priority: High  . Peripheral vascular disease ([place]) 07/26/2009    Priority: High  . Aortic valve stenosis, moderate 08/15/2011    Priority: Medium  . Hyperlipidemia 11/22/2007    Priority: Medium  . Essential hypertension 02/05/2007    Priority: Medium  . 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 Ascorbic Acid (VITAMIN C) 500 MG tablet Take 500 mg by mouth daily.      Marland Kitchen aspirin EC 81 MG tablet Take 81 mg by mouth daily.    Marland Kitchen atorvastatin (LIPITOR) 40 MG tablet Take 1 tablet (40 mg total) by mouth daily. 90 tablet 3  . Calcium Carbonate-Vitamin D (CALTRATE 600+D) 600-400 MG-UNIT per tablet Take 1 tablet by mouth daily.      . cilostazol (PLETAL) 100 MG tablet Take 1 tablet (100 mg total) by mouth daily. 90 tablet 3  . irbesartan (AVAPRO) 150 MG tablet TAKE 1 TABLET AT BEDTIME 90 tablet 1  . Multiple Vitamins-Minerals (DIASENSE MULTIVITAMIN) TABS Take 1 tablet by mouth.      . niacin (NIASPAN) 1000 MG CR tablet TAKE 1 TABLET AT BEDTIME 90 tablet 3   No current facility-administered medications for this visit.     Objective: BP (!) 104/52 (BP Location: Left Arm, Patient Position: Sitting, Cuff Size: Normal)   Pulse (!) 121   Temp 97.8 F (36.6 C) (Oral)   Wt 141 lb 6.4 oz  (64.1 kg)   SpO2 96%   BMI 22.65 kg/m  Gen: NAD, resting comfortably Tongue and lips now reasonably moist CV: regular rhythm but tachydardic no murmurs rubs or gallops Lungs: CTAB no crackles, wheeze, rhonchi Abdomen: soft/nontender/nondistended/normal bowel sounds. No rebound or guarding.  Ext: no edema Skin: warm, dry, improved skin turgor  EKG: Sinus tachycardia with rate of 116, normal intervals, no hypertrophy other than left atrial hypertrophy noted in lead II, no st or t wave changes   Assessment/Plan:  Fatigue Tachycardia S: Patient seen 2 days ago for loss of appetite, diarrhea, low energy since starting macrodantin. Thought may have been medication side effect but he had stopped 2 days prior. He appeared dehydrated on exam- we held his  hctz and halved his irbesartan.   Today, tachycardic and BP lower than previous even without his BP meds (actually had not been on hctz last visit). Has gained slight weight back. States last bowel movement was on Tuesday and small volume pellets. States feeling a lot better, but still tired. Mildly nauseous. Lights seem somewhat brighter but no blurry vision. Starting to eat again. Has been able to drink the 4 oz every hour or so.   Regarding urology follow up, Dr. Noah Delaine was to send urine test results to me (have not seen yet). He  could not prescribe another antibioitc based on current prescriptions.   A/P: Tachycardia- baseline tends to run on high side. Does not appear as dehydrated. BP also lower- will hold irbesartan completely and recheck next week. He went home and discovered that he was not taking hctz.  Fatigue- given tachycardia- also update labs, rule out anemia contributing to both as well as check cmp.   Elevated PSA due to urinary infection- await culture and decide on therapy next week likely if continues to do better  1 week  Orders Placed This Encounter  Procedures  . CBC with Differential/Platelet  . Comprehensive  metabolic panel    [place]  . EKG 12-Lead    Order Specific Question:   Where should this test be performed    Answer:   Other   Return precautions advised.  Garret Reddish, MD

## 2016-06-08 NOTE — Progress Notes (Signed)
Pre visit review using our clinic review tool, if applicable. No additional management support is needed unless otherwise documented below in the visit note. 

## 2016-06-09 ENCOUNTER — Ambulatory Visit: Payer: Medicare Other | Admitting: Family Medicine

## 2016-06-13 NOTE — Progress Notes (Signed)
Subjective:  Brett [name] is a 75 y.o. year old very pleasant male patient who presents for/with See problem oriented charting ROS- no fever, chills, nausea, vomting. Mild cough, no shortness of breath.see any ROS included in HPI as well.   Past Medical History-  Patient Active Problem List   Diagnosis Date Noted  . Elevated PSA 03/17/2016    Priority: High  . Pulmonary nodule 06/12/2014    Priority: High  . TOBACCO ABUSE 07/26/2009    Priority: High  . CAD, ARTERY BYPASS GRAFT 07/26/2009    Priority: High  . CAROTID STENOSIS 07/26/2009    Priority: High  . Peripheral vascular disease ([place]) 07/26/2009    Priority: High  . Aortic valve stenosis, moderate 08/15/2011    Priority: Medium  . Hyperlipidemia 11/22/2007    Priority: Medium  . Essential hypertension 02/05/2007    Priority: Medium  . 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 Ascorbic Acid (VITAMIN C) 500 MG tablet Take 500 mg by mouth daily.      Marland Kitchen atorvastatin (LIPITOR) 40 MG tablet Take 1 tablet (40 mg total) by mouth daily. 90 tablet 3  . Calcium Carbonate-Vitamin D (CALTRATE 600+D) 600-400 MG-UNIT per tablet Take 1 tablet by mouth daily.      . cilostazol (PLETAL) 100 MG tablet Take 1 tablet (100 mg total) by mouth daily. 90 tablet 3  . Multiple Vitamins-Minerals (DIASENSE MULTIVITAMIN) TABS Take 1 tablet by mouth.      . niacin (NIASPAN) 1000 MG CR tablet TAKE 1 TABLET AT BEDTIME 90 tablet 3  . irbesartan (AVAPRO) 150 MG tablet TAKE 1 TABLET AT BEDTIME (Patient not taking: Reported on 06/14/2016) 90 tablet 1   No current facility-administered medications for this visit.     Objective: BP 140/82 (BP Location: Left Arm, Patient Position: Sitting, Cuff Size: Normal)   Pulse 96   Temp 97.8 F (36.6 C) (Oral)   Wt 141 lb 6.4 oz (64.1 kg)   SpO2 97%   BMI 22.65 kg/m  Gen: NAD, resting  comfortably Oropharynx normal, no rhinorrhea at prsent CV: RRR no murmurs rubs or gallops Lungs: CTAB no crackles, wheeze, rhonchi  Ext: no edema Skin: warm, dry, no rash  Assessment/Plan:  Elevated PSA Fatigue/tachycardia Leukocytosis Elevated Creatinine/AKI S: last week seen for diarrhea/low energy since starting macrodantin for concern UTI potentially causing elevated PSA. Stopped medicine and symptoms improved though was dehydrated on initial exam. Followed up a few days later and was noted to be tachycardic with lower BP- despite taking half of his irbesartan. Had gained slight weight back. Was able to push fluids with 4 oz every hour or so. At most recent visit held irbesartan completely. Labs were done due to tachycardia and diarrhea and patient did have elevated WBC which was chronic as well as elevated creatinine  We received records from urology from recent urine culture and staph epidermidis sensitive to cipro, daptomycin, gentamicin, levofloxacin, lenezolid, bactrim, rifampin, tetracycline, vacomycin. Resistant to penicillin and oxacillin. . Dr. Noah Delaine stated decision on antibiotic should be through PCP after initial poor reaction.   Today states no diarrhea in days, he is no longer experiencing fatigue except very low level. He does have mild cough but chest clear on exam- may be early URI A/P: Discussed further about elevated PSA and he actually has biopsy planned for later this year. Due to this, we opted as possible contaminant  of staph epi to get repeat urine culture today and treat only if presents again.   Also cbc to monitor leukocytosis- may get hematology referral. Also bmp to follow up elevated creatinine- remain off irbesartan for now- if has returned to normal will restart at half tablet with BP running near normal today.   Possible early URI- return precautions given  Tachycardia has resolved with better hydration  Colonoscopy is going to postpone as wife needs  urgent surgery for cervical spine issues  Orders Placed This Encounter  Procedures  . Urine culture  . CBC with Differential/Platelet  . Basic metabolic panel    [place]  . POCT Urinalysis Dipstick (Automated)   Return precautions advised.  Garret Reddish, MD

## 2016-06-14 ENCOUNTER — Ambulatory Visit (INDEPENDENT_AMBULATORY_CARE_PROVIDER_SITE_OTHER): Payer: Medicare Other | Admitting: Family Medicine

## 2016-06-14 ENCOUNTER — Telehealth: Payer: Self-pay | Admitting: Internal Medicine

## 2016-06-14 ENCOUNTER — Encounter: Payer: Self-pay | Admitting: Family Medicine

## 2016-06-14 VITALS — BP 140/82 | HR 96 | Temp 97.8°F | Wt 141.4 lb

## 2016-06-14 DIAGNOSIS — I6523 Occlusion and stenosis of bilateral carotid arteries: Secondary | ICD-10-CM | POA: Diagnosis not present

## 2016-06-14 DIAGNOSIS — N390 Urinary tract infection, site not specified: Secondary | ICD-10-CM | POA: Diagnosis not present

## 2016-06-14 DIAGNOSIS — D72829 Elevated white blood cell count, unspecified: Secondary | ICD-10-CM

## 2016-06-14 DIAGNOSIS — R7989 Other specified abnormal findings of blood chemistry: Secondary | ICD-10-CM

## 2016-06-14 LAB — BASIC METABOLIC PANEL
BUN: 13 mg/dL (ref 6–23)
CHLORIDE: 103 meq/L (ref 96–112)
CO2: 31 mEq/L (ref 19–32)
Calcium: 9.9 mg/dL (ref 8.4–10.5)
Creatinine, Ser: 1.16 mg/dL (ref 0.40–1.50)
GFR: 65.24 mL/min (ref >60.00)
Glucose, Bld: 99 mg/dL (ref 70–99)
POTASSIUM: 4 meq/L (ref 3.5–5.1)
SODIUM: 139 meq/L (ref 135–145)

## 2016-06-14 LAB — CBC WITH DIFFERENTIAL/PLATELET
BASOS ABS: 0 10*3/uL (ref 0.0–0.1)
BASOS PCT: 0.3 % (ref 0.0–3.0)
EOS ABS: 0.1 10*3/uL (ref 0.0–0.7)
Eosinophils Relative: 0.7 % (ref 0.0–5.0)
HCT: 37.4 % — ABNORMAL LOW (ref 39.0–52.0)
Hemoglobin: 12.9 g/dL — ABNORMAL LOW (ref 13.0–17.0)
LYMPHS ABS: 1.8 10*3/uL (ref 0.7–4.0)
Lymphocytes Relative: 15.7 % (ref 12.0–46.0)
MCHC: 34.6 g/dL (ref 30.0–36.0)
MCV: 96.2 fl (ref 78.0–100.0)
MONOS PCT: 6.4 % (ref 3.0–12.0)
Monocytes Absolute: 0.7 10*3/uL (ref 0.1–1.0)
NEUTROS ABS: 8.7 10*3/uL — AB (ref 1.4–7.7)
NEUTROS PCT: 76.9 % (ref 43.0–77.0)
PLATELETS: 385 10*3/uL (ref 150.0–400.0)
RBC: 3.89 Mil/uL — ABNORMAL LOW (ref 4.22–5.81)
RDW: 13.5 % (ref 11.5–15.5)
WBC: 11.3 10*3/uL — ABNORMAL HIGH (ref 4.0–10.5)

## 2016-06-14 LAB — POC URINALSYSI DIPSTICK (AUTOMATED)
BILIRUBIN UA: NEGATIVE
Glucose, UA: NEGATIVE
Nitrite, UA: POSITIVE
PH UA: 6
Spec Grav, UA: 1.015
Urobilinogen, UA: 0.2

## 2016-06-14 NOTE — Telephone Encounter (Signed)
Left message for endo unit to call back to cancel appt in December. Reminder in epic to call pt in Feb to set up appt in April.

## 2016-06-14 NOTE — Telephone Encounter (Signed)
Left message to call back  

## 2016-06-14 NOTE — Patient Instructions (Signed)
If I tell you kidney function has normalized- we will resume the irbesartan at 1/2 tablet. If not- likely would remain off medicine then repeat BP and kidney function in a few weeks  Also check on blood counts and urine culture (culture will take longer than other labs to come back)  Sign up for mychart and I will send results through there

## 2016-06-15 ENCOUNTER — Other Ambulatory Visit: Payer: Self-pay

## 2016-06-15 DIAGNOSIS — D72829 Elevated white blood cell count, unspecified: Secondary | ICD-10-CM

## 2016-06-15 NOTE — Telephone Encounter (Signed)
Procedure cancelled at [facility].

## 2016-06-16 ENCOUNTER — Telehealth: Payer: Self-pay | Admitting: Family Medicine

## 2016-06-16 NOTE — Telephone Encounter (Signed)
Spoke with wife who verbalized understanding of lab results.

## 2016-06-16 NOTE — Telephone Encounter (Signed)
Brett [name] pt wife is on DPR  would like her husband  test result. Pt did not understand what jaime said

## 2016-06-19 LAB — URINE CULTURE

## 2016-06-20 ENCOUNTER — Other Ambulatory Visit: Payer: Self-pay | Admitting: Family Medicine

## 2016-06-20 MED ORDER — LEVOFLOXACIN 250 MG PO TABS: 250.0000 mg | ORAL_TABLET | Freq: Every day | ORAL | 0 refills | Status: DC

## 2016-06-20 NOTE — Progress Notes (Signed)
levaquin  

## 2016-06-21 ENCOUNTER — Telehealth: Payer: Self-pay | Admitting: Family Medicine

## 2016-06-21 NOTE — Telephone Encounter (Signed)
Brett [name]- can you see what the reported interaction is? I ran it in an Statistician and I did not see any concern- would like to learn what the potential issue is

## 2016-06-21 NOTE — Telephone Encounter (Signed)
pts wife would like to have a call back to discuss the two Rx LEVAQUIN and PLETAL. They did not refill the Rx and really would like to speak with you as soon as possible.

## 2016-06-21 NOTE — Telephone Encounter (Signed)
[facility]  Patient Name: Brett [name]  DOB: Jun 28, 1941    Initial Comment Caller's husband was prescribed a medication yesterday for his UTI that interferes with another heart medicine he is taking, She is wondering how to proceed.    Nurse Assessment  Nurse: Harlow Mares, RN, Suanne Marker Date/Time Eilene Ghazi Time): 06/21/2016 3:03:11 PM  Confirm and document reason for call. If symptomatic, describe symptoms. You must click the next button to save text entered. ---Caller's husband was prescribed a medication yesterday for his UTI that interferes with another medicine he is taking, She is wondering how to proceed. Levaquin interferes with Pletal according to pharmacist. Reports that patient has not begun the Levaquin, he didn't pick this up from the pharmacy. Dr. Yong Channel ordered the [place].  Has the patient traveled out of the country within the last 30 days? ---Not Applicable  Does the patient have any new or worsening symptoms? ---No  Please document clinical information provided and list any resource used. ---Caller reports that she called this morning to discuss with nurse in MD office and they didn't call her back. She would like for Dr. Yong Channel to call something else besides the Levaquin into the pharmacy please to cover the UTI since the interaction was brought to her attention via the pharmacist. Advised caller that nurse will alert the MD office and to call back for any new/worsening symptoms.     Guidelines    Guideline Title Affirmed Question Affirmed Notes       Final Disposition User   Clinical Call Harlow Mares, RN, Suanne Marker

## 2016-06-21 NOTE — Telephone Encounter (Signed)
Dr. Hunter - Please advise. Thanks! 

## 2016-06-22 MED ORDER — DOXYCYCLINE HYCLATE 100 MG PO TABS: 100.0000 mg | ORAL_TABLET | Freq: Two times a day (BID) | ORAL | 0 refills | Status: DC

## 2016-06-22 NOTE — Telephone Encounter (Signed)
Thanks for information- surprised interaction checker did not pick that up.   Sent in doxycycline 100mg  twice a day for 10 days- please inform patieint

## 2016-06-22 NOTE — Telephone Encounter (Signed)
I spoke with the pharmacist Merrilee Seashore at [facility] and he states they are concerned about a prolonged QTC interval and drug interaction with the medication cilostazol. Please advise

## 2016-06-22 NOTE — Telephone Encounter (Signed)
Called and left a voicemail message asking for a return phone call 

## 2016-06-23 ENCOUNTER — Telehealth: Payer: Self-pay | Admitting: Cardiovascular Disease

## 2016-06-23 ENCOUNTER — Telehealth: Payer: Self-pay

## 2016-06-23 NOTE — Telephone Encounter (Signed)
I spoke with the pt's wife and she has been giving the pt cilostazol 100mg  twice a day.  I made her aware that this is a twice a day medication and he needs to continue with current regimen.  I will update the pt's medication list.

## 2016-06-23 NOTE — Telephone Encounter (Signed)
They told me he was on macrodantin/nitrofurantoin- not doxycycline. Was this inaccurate?

## 2016-06-23 NOTE — Telephone Encounter (Signed)
Spoke with Brett [name] who states they will pick up the prescription and start him on it tomorrow. She will call on Monday to update how he is tolerating the med. According to the paper work she pulled from Dr. Doristine Devoid office on 10/26 [place] was prescribed Nitrofurantin NCR 100 mg capsules.

## 2016-06-23 NOTE — Telephone Encounter (Signed)
New message   Pt wife Butch Penny verbalized that she is eager to know what was said to Dr.Hunter about medication for pt UTI and the dosage of Pletal 100mg  but is it 1 or 2x day

## 2016-06-23 NOTE — Telephone Encounter (Signed)
Usually twice a day medicine- will ask Dr. Burt Knack to weigh in- may have had a reason for the change

## 2016-06-23 NOTE — Telephone Encounter (Signed)
Spoke with the wife is very upset. She states that he can't take the doxycycline. The side effects had hum sick a couple of weeks ago when he tried it. She wants something else sent in.

## 2016-06-23 NOTE — Telephone Encounter (Signed)
Spoke to the wife who has been giving her husband   cilostazol (PLETAL) 100 MG tablet  Two times a day versus the once a day it is ordered. It looks like Dr. Burt Knack ordered once a day on 12/13/15.  She wants to know if he should just be taking it once a day?

## 2016-06-23 NOTE — Telephone Encounter (Signed)
Spoke to the wife who also put a call into Dr. Antionette Char office. I will wait until I hear a definite before responding to her.

## 2016-07-03 ENCOUNTER — Telehealth: Payer: Self-pay | Admitting: Cardiovascular Disease

## 2016-07-03 ENCOUNTER — Other Ambulatory Visit: Payer: Self-pay

## 2016-07-03 ENCOUNTER — Telehealth: Payer: Self-pay | Admitting: Family Medicine

## 2016-07-03 DIAGNOSIS — N390 Urinary tract infection, site not specified: Secondary | ICD-10-CM

## 2016-07-03 MED ORDER — ATORVASTATIN CALCIUM 40 MG PO TABS: 40.0000 mg | ORAL_TABLET | Freq: Every day | ORAL | 3 refills | Status: DC

## 2016-07-03 NOTE — Telephone Encounter (Signed)
New Message   *STAT* If patient is at the pharmacy, call can be transferred to refill team.   1. Which medications need to be refilled? (please list name of each medication and dose if known) Atorvastatin   2. Which pharmacy/location (including street and city if local pharmacy) is medication to be sent to? [facility]  3. Do they need a 30 day or 90 day supply? 90  Per pt wife. Pt is out of medication and is not taking her medication.

## 2016-07-03 NOTE — Telephone Encounter (Signed)
wife would like to know when pt should be re tested for his UTI? Pt finished abx today.

## 2016-07-03 NOTE — Telephone Encounter (Signed)
Order entered for Urine Culture. I called and left a detailed voicemail message informing him & his wife Butch Penny. I did state they could return my call if they had questions.

## 2016-07-03 NOTE — Telephone Encounter (Signed)
Let's do this Thursday or Friday- Urine culture under UTI please.

## 2016-07-03 NOTE — Telephone Encounter (Signed)
I spoke with the pt's wife and made her aware Rx sent to the mail order pharmacy.

## 2016-07-06 ENCOUNTER — Other Ambulatory Visit: Payer: Medicare Other

## 2016-07-06 DIAGNOSIS — N390 Urinary tract infection, site not specified: Secondary | ICD-10-CM

## 2016-07-08 LAB — URINE CULTURE: Organism ID, Bacteria: NO GROWTH

## 2016-07-21 ENCOUNTER — Ambulatory Visit (HOSPITAL_COMMUNITY): Admit: 2016-07-21 | Payer: Medicare Other | Admitting: Internal Medicine

## 2016-07-21 ENCOUNTER — Encounter (HOSPITAL_COMMUNITY): Payer: Self-pay

## 2016-07-21 SURGERY — COLONOSCOPY WITH PROPOFOL
Anesthesia: Monitor Anesthesia Care

## 2016-08-02 ENCOUNTER — Ambulatory Visit (HOSPITAL_BASED_OUTPATIENT_CLINIC_OR_DEPARTMENT_OTHER): Payer: Medicare Other | Admitting: Hematology & Oncology

## 2016-08-02 ENCOUNTER — Encounter: Payer: Self-pay | Admitting: Hematology & Oncology

## 2016-08-02 ENCOUNTER — Other Ambulatory Visit (HOSPITAL_BASED_OUTPATIENT_CLINIC_OR_DEPARTMENT_OTHER): Payer: Medicare Other

## 2016-08-02 ENCOUNTER — Ambulatory Visit: Payer: Medicare Other

## 2016-08-02 VITALS — BP 97/61 | HR 120 | Temp 97.6°F | Resp 18 | Wt 136.0 lb

## 2016-08-02 DIAGNOSIS — D510 Vitamin B12 deficiency anemia due to intrinsic factor deficiency: Secondary | ICD-10-CM | POA: Diagnosis not present

## 2016-08-02 DIAGNOSIS — D72829 Elevated white blood cell count, unspecified: Secondary | ICD-10-CM

## 2016-08-02 DIAGNOSIS — D508 Other iron deficiency anemias: Secondary | ICD-10-CM

## 2016-08-02 DIAGNOSIS — N182 Chronic kidney disease, stage 2 (mild): Secondary | ICD-10-CM | POA: Diagnosis not present

## 2016-08-02 DIAGNOSIS — D649 Anemia, unspecified: Secondary | ICD-10-CM

## 2016-08-02 DIAGNOSIS — D72823 Leukemoid reaction: Secondary | ICD-10-CM | POA: Diagnosis not present

## 2016-08-02 DIAGNOSIS — D631 Anemia in chronic kidney disease: Secondary | ICD-10-CM

## 2016-08-02 DIAGNOSIS — I350 Nonrheumatic aortic (valve) stenosis: Secondary | ICD-10-CM

## 2016-08-02 HISTORY — DX: Leukemoid reaction: D72.823

## 2016-08-02 HISTORY — DX: Elevated white blood cell count, unspecified: D72.829

## 2016-08-02 LAB — CBC WITH DIFFERENTIAL (CANCER CENTER ONLY)
BASO#: 0 10*3/uL (ref 0.0–0.2)
BASO%: 0.2 % (ref 0.0–2.0)
EOS ABS: 0.1 10*3/uL (ref 0.0–0.5)
EOS%: 0.6 % (ref 0.0–7.0)
HCT: 32.3 % — ABNORMAL LOW (ref 38.7–49.9)
HGB: 11.4 g/dL — ABNORMAL LOW (ref 13.0–17.1)
LYMPH#: 1.7 10*3/uL (ref 0.9–3.3)
LYMPH%: 15.2 % (ref 14.0–48.0)
MCH: 34 pg — AB (ref 28.0–33.4)
MCHC: 35.3 g/dL (ref 32.0–35.9)
MCV: 96 fL (ref 82–98)
MONO#: 1.1 10*3/uL — AB (ref 0.1–0.9)
MONO%: 9.6 % (ref 0.0–13.0)
NEUT#: 8.5 10*3/uL — ABNORMAL HIGH (ref 1.5–6.5)
NEUT%: 74.4 % (ref 40.0–80.0)
PLATELETS: 320 10*3/uL (ref 145–400)
RBC: 3.35 10*6/uL — AB (ref 4.20–5.70)
RDW: 14 % (ref 11.1–15.7)
WBC: 11.4 10*3/uL — AB (ref 4.0–10.0)

## 2016-08-02 LAB — IRON AND TIBC
%SAT: 17 % — ABNORMAL LOW (ref 20–55)
Iron: 43 ug/dL (ref 42–163)
TIBC: 258 ug/dL (ref 202–409)
UIBC: 215 ug/dL (ref 117–376)

## 2016-08-02 LAB — CHCC SATELLITE - SMEAR

## 2016-08-02 LAB — FERRITIN: Ferritin: 334 ng/ml — ABNORMAL HIGH (ref 22–316)

## 2016-08-02 LAB — LACTATE DEHYDROGENASE: LDH: 154 U/L (ref 125–245)

## 2016-08-02 NOTE — Progress Notes (Signed)
Referral MD  Reason for Referral: Anemia and leukocytosis.   Chief Complaint  Patient presents with  . New Evaluation  : I'm not sure as to why I am here. I think my blood is abnormal.  HPI: Brett [name] is a very nice 75 year old white male. He is followed by Dr. Yong Channel. He's been found to have some issues with leukocytosis over the past summer years. However, it seems as if his anemia is becoming more of an issue.  5 years ago, his white cell count was 13.5. Hemoglobin 15.7. Platelet count 291,000. MCV was 99.6.  Back in October, his white cell count was 11.7. Hemoglobin 12.1. Platelet count 302,000. MCV was 96.  He was supposed to have had a colonoscopy. However, his wife needed neck surgery and a colonoscopy has been rescheduled.  He has not noted any obvious bleeding. He's had some weight loss. His appetite is decreased.  Of note, back in August, his PSA was 9.6. He is undergoing a prostate biopsy in January. He does have some symptoms of BPH.  I do not think there's been any new medications that he's been on.  He has had no issues with cough. He is a long-time smoker. He is not sure when his last chest x-ray was. He had she had a CT of the chest back in late August. This showed a right lung nodule in the mid lung measuring 5 mm. Other nodules that a seen on previous exams have resolved. Is no adenopathy in the mediastinum. He did have a kidney stone in the left kidney. The liver looked okay. Gallbladder was not enlarged.  He is had no obvious melena or hematochezia. He's had no dysphagia or odynophagia.  He's had no rashes. He's had no leg swelling.  Of note, he has have severe aortic stenosis. He had a echocardiogram done back in April. This showed severe stenosis. He has a severely calcified mitral valve. Left atrium was moderately dilated.  Overall, his performance status is ECOG 1.  He has no obvious occupational exposures. 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  He is originally from [place]. He has been down here for over 15 years.  He does not drink. He has no history of blood transfusions. He has no risk factors for HIV or hepatitis.    Past Medical History:  Diagnosis Date  . CAD (coronary artery disease) of artery bypass graft   . Carotid stenosis   . Elevated PSA   . Hyperglycemia   . Hyperlipidemia   . Hyperplastic colon polyp 11/24/2009   x7  . Hypertension   . Leukocytosis 08/02/2016  . 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  . PVD (peripheral vascular disease) ([place])   :  Past Surgical History:  Procedure Laterality Date  . CORONARY ARTERY BYPASS GRAFT  2006  :   Current Outpatient Prescriptions:  .  alfuzosin (UROXATRAL) 10 MG 24 hr tablet, Take 10 mg by mouth at bedtime., Disp: , Rfl: 99 .  Ascorbic Acid (VITAMIN C) 500 MG tablet, Take 500 mg by mouth daily.  , Disp: , Rfl:  .  atorvastatin (LIPITOR) 40 MG tablet, Take 1 tablet (40 mg total) by mouth daily., Disp: 90 tablet, Rfl: 3 .  Calcium Carbonate-Vitamin D (CALTRATE 600+D) 600-400 MG-UNIT per tablet, Take 1 tablet by mouth daily.  , Disp: , Rfl:  .  cilostazol (PLETAL) 100 MG tablet, Take 1 tablet (100 mg total) by mouth 2 (two) times daily., Disp: 90  tablet, Rfl: 3 .  doxycycline (VIBRA-TABS) 100 MG tablet, Take 1 tablet (100 mg total) by mouth 2 (two) times daily., Disp: 20 tablet, Rfl: 0 .  irbesartan (AVAPRO) 150 MG tablet, TAKE 1 TABLET AT BEDTIME (Patient not taking: Reported on 06/14/2016), Disp: 90 tablet, Rfl: 1 .  Multiple Vitamins-Minerals (DIASENSE MULTIVITAMIN) TABS, Take 1 tablet by mouth.  , Disp: , Rfl:  .  niacin (NIASPAN) 1000 MG CR tablet, TAKE 1 TABLET AT BEDTIME, Disp: 90 tablet, Rfl: 3:  :  No Known Allergies:  Family History  Problem Relation Age of Onset  . Heart attack Father   . Dementia Mother   . Colon cancer Neg Hx   . Colon polyps Neg Hx   . Rectal cancer Neg Hx   . Stomach cancer Neg Hx   :  Social History    Social History  . Marital status: Married    Spouse name: N/A  . Number of children: 3  . Years of education: N/A   Occupational History  . Retired    Social History Main Topics  . Smoking status: Former Smoker    Packs/day: 1.00    Years: 40.00    Types: Cigarettes  . Smokeless tobacco: Former Systems developer    Quit date: 12/06/2015  . Alcohol use 0.0 oz/week     Comment: occasionaly  . Drug use: No  . Sexual activity: Not on file   Other Topics Concern  . Not on file   Social History Narrative   Married 1988 2nd marriage. 1 child from 2nd marriage (1994). 2 children from first marriage. 3-4 grandkids-broken relationship with his son though.       Retired from [employer] after 36 years in 1999. 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       Hobbies: gardening, formerly bowling and pool, family time  :  Pertinent items are noted in HPI.  Exam: @IPVITALS @ Well-developed and well-nourished white male in no obvious distress. Vital signs are temperature of 97.6. Pulse is 122. Blood pressure 97/61. Weight is 136 pounds. Head and neck exam shows no ocular or oral lesions. He has no palpable cervical or supraclavicular lymph nodes. Lungs are clear bilaterally. Cardiac exam slightly tachycardic but regular. He has a 3/6 systolic ejection murmur. This is consistent with aortic stenosis. Abdomen is soft. Has good bowel sounds. There is no fluid wave. There is no palpable liver or spleen tip. Back exam shows no tenderness over the spine, ribs or hips. Extremities shows some 1+ edema in his legs. Skin exam shows no rashes, ecchymoses or petechia. Neurological exam shows no focal neurological deficits.    Recent Labs  08/02/16 1037  WBC 11.4*  HGB 11.4*  HCT 32.3*  PLT 320   No results for input(s): NA, K, CL, CO2, GLUCOSE, BUN, CREATININE, CALCIUM in the last 72 hours.  Blood smear review: Mild anisocytosis and poikilocytosis. There might be a couple schistocytes. I see no  nucleated red blood cells. He has no target cells. I see no rouleau formation. There are no inclusion. White cells appear normal in morphology maturation. He has no hypersegmented polys. He has no immature myeloid or lymphoid forms. Platelets are normal in morphology and maturation. Platelets are well granulated.  Pathology: None     Assessment and Plan:  Brett [name] is a 75 year old white male. He has chronic mild leukocytosis. This really does not concern me. He's had this for several years. His white cell count is  not going up. I doubt that he has any kind of myeloproliferative disorder. I don't see anything that looks like chronic leukemia. Nothing on his list smear or on his exam shows any evidence of myelofibrosis.  I am more interested in his anemia. We did send off iron studies. His iron saturation is only 17%. His ferritin is 334. I think the colonoscopy will be a very good idea.  I will have to see what his reticulocyte count is. I'm also sending off an erythropoietin level on him.  I just wonder if he may not have some hemolysis from this severe aortic stenosis. This has been known to happen.  Ultimately, we may need to do a bone marrow biopsy on him.  I will have to see what his erythropoietin level is. I'm also sending off an SPEP.  Given his age, he clearly may have low testosterone. If this were the case, I would not think that he would have an enlarged prostate. However, this could happen.  Once we see what all the lab results show, then we will be able to get him back if he got how we can help him.  We certainly could  give him iron. I think there might be some element of iron deficiency.  I spent about an hour with he and his wife. They're both very nice. It was fun talking with them. I went over all his lab work. I answered all their questions.

## 2016-08-03 LAB — KAPPA/LAMBDA LIGHT CHAINS
IG KAPPA FREE LIGHT CHAIN: 36.8 mg/L — AB (ref 3.3–19.4)
IG LAMBDA FREE LIGHT CHAIN: 28.3 mg/L — AB (ref 5.7–26.3)
KAPPA/LAMBDA FLC RATIO: 1.3 (ref 0.26–1.65)

## 2016-08-03 LAB — IGG, IGA, IGM
IGA/IMMUNOGLOBULIN A, SERUM: 161 mg/dL (ref 61–437)
IGM (IMMUNOGLOBIN M), SRM: 113 mg/dL (ref 15–143)
IgG, Qn, Serum: 869 mg/dL (ref 700–1600)

## 2016-08-03 LAB — ERYTHROPOIETIN: Erythropoietin: 20 m[IU]/mL — ABNORMAL HIGH (ref 2.6–18.5)

## 2016-08-03 LAB — HAPTOGLOBIN: HAPTOGLOBIN: 385 mg/dL — AB (ref 34–200)

## 2016-08-03 LAB — RETICULOCYTES: Reticulocyte Count: 2.9 % — ABNORMAL HIGH (ref 0.6–2.6)

## 2016-08-03 LAB — VITAMIN B12: Vitamin B12: 1433 pg/mL — ABNORMAL HIGH (ref 232–1245)

## 2016-08-03 LAB — TESTOSTERONE: TESTOSTERONE: 177 ng/dL — AB (ref 264–916)

## 2016-08-04 ENCOUNTER — Telehealth: Payer: Self-pay | Admitting: Family Medicine

## 2016-08-04 ENCOUNTER — Telehealth: Payer: Self-pay | Admitting: Cardiovascular Disease

## 2016-08-04 LAB — PROTEIN ELECTROPHORESIS, SERUM, WITH REFLEX
A/G Ratio: 1 (ref 0.7–1.7)
Albumin: 3.3 g/dL (ref 2.9–4.4)
Alpha 1: 0.4 g/dL (ref 0.0–0.4)
Alpha 2: 1.1 g/dL — ABNORMAL HIGH (ref 0.4–1.0)
Beta: 0.9 g/dL (ref 0.7–1.3)
Gamma Globulin: 0.8 g/dL (ref 0.4–1.8)
Globulin, Total: 3.2 g/dL (ref 2.2–3.9)
Total Protein: 6.5 g/dL (ref 6.0–8.5)

## 2016-08-04 NOTE — Telephone Encounter (Signed)
Patient Name: Brett [name]  DOB: 10/12/1940    Initial Comment Caller states husband's bp is very low, around 83/63 asking if he needs to stop the bp meds, having numbness in both hands and fingers    Nurse Assessment  Nurse: Verlin Fester, RN, Stanton Kidney Date/Time Eilene Ghazi Time): 08/04/2016 12:07:59 PM  Confirm and document reason for call. If symptomatic, describe symptoms. ---Caller states husband's bp is very low, around 83/63 asking if he needs to stop the bp meds, having numbness in both hands and fingers and he is dizzy  Does the patient have any new or worsening symptoms? ---Yes  Will a triage be completed? ---Yes  Related visit to physician within the last 2 weeks? ---No  Does the PT have any chronic conditions? (i.e. diabetes, asthma, etc.) ---Yes  List chronic conditions. ---"hx dehydration, leukemia, anemia, heart, CABG,  Is this a behavioral health or substance abuse call? ---No     Guidelines    Guideline Title Affirmed Question Affirmed Notes  Low Blood Pressure [id number] Systolic BP < 90 AND [id number] dizzy, lightheaded, or weak    Final Disposition User   Call EMS 911 Now Verlin Fester, RN, Stanton Kidney    Comments  After triage wife states do you know what I called the doctor about. Instructed her that he called to ask if he needs to stop his BP medicines. Instructed her that if his BP is this low she should not give him the medicine and he needs to be seen because it is dangerously low. [name] states he saw an oncologist yesterday and they did not tell her his BP was dangerously low.. Instructed wife that I will call the office and speak with the nurse about this and see if Dr. Yong Channel wants his to do anything different.   Disagree/Comply: Disagree  Disagree/Comply Reason: Disagree with instructions   Spoke with office nurse Juliann Pulse and notified her that wife does not want to call 911

## 2016-08-04 NOTE — Telephone Encounter (Signed)
New Message:    Can pt stop his Avapro 75mg ? Pt blood pressure have been extremely low.

## 2016-08-04 NOTE — Telephone Encounter (Signed)
Spoke with pt's wife and advised that HTN medications need to be addressed by cardiology. She voiced understanding. She is calling Dr Antionette Char office now and will get their advice on medications and symptoms. Nothing further needed at this time.

## 2016-08-04 NOTE — Telephone Encounter (Signed)
Patient's wife called complaining of patient being extremely weak and having a low BP. PCP already advised patient to cut his Avapro in half. Patient has been taking Avapro 75 mg daily for some time now, but his BP is still low. Patient BP at last office visit with oncology was 97/61 on 08/02/16. Patient's wife stated this has been going on since November. Patient's wife decided to stop patient's medication last night, and she stated patient felt better today. Consulted Dr. Curt Bears (DOD) about stopping Avapro. Dr. Curt Bears recommend to stop avapro and let our office know how he is doing in a week. Called patient's wife back with Dr. Macky Lower advisement, and wife was very thankful. Patient's wife will call our office if any concern arise, and in a week to let our office know how the patient is doing. Patient's wife verbalized understanding. Will forward to Dr. Burt Knack and his nurse so they are aware.

## 2016-08-09 DIAGNOSIS — M7581 Other shoulder lesions, right shoulder: Secondary | ICD-10-CM | POA: Diagnosis not present

## 2016-08-09 DIAGNOSIS — M545 Low back pain: Secondary | ICD-10-CM | POA: Diagnosis not present

## 2016-08-09 DIAGNOSIS — M542 Cervicalgia: Secondary | ICD-10-CM | POA: Diagnosis not present

## 2016-08-09 DIAGNOSIS — M7582 Other shoulder lesions, left shoulder: Secondary | ICD-10-CM | POA: Diagnosis not present

## 2016-08-09 NOTE — Telephone Encounter (Signed)
Agree with stopping avapro. thanks

## 2016-08-11 ENCOUNTER — Telehealth: Payer: Self-pay | Admitting: Cardiovascular Disease

## 2016-08-11 NOTE — Telephone Encounter (Signed)
Would remain off of avapro as his BP is in the low-normal range and he has severe aortic stenosis.

## 2016-08-11 NOTE — Telephone Encounter (Signed)
Follow Up:    Wife called and said pt's blood pressure is much better.Today it was 106/63 and pulse rate was 92.Pt will stay off of his Avapro until he hears from Dr Burt Knack.

## 2016-08-11 NOTE — Telephone Encounter (Signed)
I spoke with Butch Penny and made her aware of instructions from Dr Burt Knack. They will continue to monitor BP a few times per week and contact the office for further instructions if BP is elevated.  The pt has a pending appointment on 08/30/16 with Dr Burt Knack and we will further discuss aortic stenosis at that time.

## 2016-08-15 ENCOUNTER — Other Ambulatory Visit: Payer: Self-pay | Admitting: Hematology & Oncology

## 2016-08-15 ENCOUNTER — Encounter: Payer: Self-pay | Admitting: Hematology & Oncology

## 2016-08-15 DIAGNOSIS — D631 Anemia in chronic kidney disease: Secondary | ICD-10-CM

## 2016-08-15 DIAGNOSIS — D500 Iron deficiency anemia secondary to blood loss (chronic): Secondary | ICD-10-CM

## 2016-08-15 HISTORY — DX: Anemia in chronic kidney disease: D63.1

## 2016-08-15 HISTORY — DX: Iron deficiency anemia secondary to blood loss (chronic): D50.0

## 2016-08-21 ENCOUNTER — Ambulatory Visit (HOSPITAL_BASED_OUTPATIENT_CLINIC_OR_DEPARTMENT_OTHER): Payer: Medicare Other

## 2016-08-21 VITALS — BP 117/49 | HR 85 | Temp 97.4°F | Resp 18

## 2016-08-21 DIAGNOSIS — D649 Anemia, unspecified: Secondary | ICD-10-CM | POA: Diagnosis not present

## 2016-08-21 DIAGNOSIS — D631 Anemia in chronic kidney disease: Secondary | ICD-10-CM

## 2016-08-21 DIAGNOSIS — D500 Iron deficiency anemia secondary to blood loss (chronic): Secondary | ICD-10-CM

## 2016-08-21 MED ORDER — SODIUM CHLORIDE 0.9 % IV SOLN
Freq: Once | INTRAVENOUS | Status: AC
  Administered 2016-08-21: 09:00:00 via INTRAVENOUS

## 2016-08-21 MED ORDER — SODIUM CHLORIDE 0.9 % IV SOLN
510.0000 mg | Freq: Once | INTRAVENOUS | Status: AC
  Administered 2016-08-21: 510 mg via INTRAVENOUS
  Filled 2016-08-21: qty 17

## 2016-08-21 NOTE — Patient Instructions (Signed)
Ferumoxytol injection What is this medicine? FERUMOXYTOL is an iron complex. Iron is used to make healthy red blood cells, which carry oxygen and nutrients throughout the body. This medicine is used to treat iron deficiency anemia in people with chronic kidney disease. COMMON BRAND NAME(S): Feraheme What should I tell my health care provider before I take this medicine? They need to know if you have any of these conditions: -anemia not caused by low iron levels -high levels of iron in the blood -magnetic resonance imaging (MRI) test scheduled -an unusual or allergic reaction to iron, other medicines, foods, dyes, or preservatives -pregnant or trying to get pregnant -breast-feeding How should I use this medicine? This medicine is for injection into a vein. It is given by a health care professional in a hospital or clinic setting. Talk to your pediatrician regarding the use of this medicine in children. Special care may be needed. What if I miss a dose? It is important not to miss your dose. Call your doctor or health care professional if you are unable to keep an appointment. What may interact with this medicine? This medicine may interact with the following medications: -other iron products What should I watch for while using this medicine? Visit your doctor or healthcare professional regularly. Tell your doctor or healthcare professional if your symptoms do not start to get better or if they get worse. You may need blood work done while you are taking this medicine. You may need to follow a special diet. Talk to your doctor. Foods that contain iron include: whole grains/cereals, dried fruits, beans, or peas, leafy green vegetables, and organ meats (liver, kidney). What side effects may I notice from receiving this medicine? Side effects that you should report to your doctor or health care professional as soon as possible: -allergic reactions like skin rash, itching or hives, swelling of the  face, lips, or tongue -breathing problems -changes in blood pressure -feeling faint or lightheaded, falls -fever or chills -flushing, sweating, or hot feelings -swelling of the ankles or feet Side effects that usually do not require medical attention (report to your doctor or health care professional if they continue or are bothersome): -diarrhea -headache -nausea, vomiting -stomach pain Where should I keep my medicine? This drug is given in a hospital or clinic and will not be stored at home.  2017 Elsevier/Gold Standard (2015-08-26 12:41:49)  

## 2016-08-29 DIAGNOSIS — R972 Elevated prostate specific antigen [PSA]: Secondary | ICD-10-CM | POA: Diagnosis not present

## 2016-08-30 ENCOUNTER — Ambulatory Visit (INDEPENDENT_AMBULATORY_CARE_PROVIDER_SITE_OTHER): Payer: Medicare Other | Admitting: Cardiovascular Disease

## 2016-08-30 ENCOUNTER — Encounter: Payer: Self-pay | Admitting: Cardiovascular Disease

## 2016-08-30 VITALS — BP 110/50 | HR 100 | Ht 66.75 in | Wt 134.1 lb

## 2016-08-30 DIAGNOSIS — I10 Essential (primary) hypertension: Secondary | ICD-10-CM

## 2016-08-30 DIAGNOSIS — I350 Nonrheumatic aortic (valve) stenosis: Secondary | ICD-10-CM | POA: Diagnosis not present

## 2016-08-30 DIAGNOSIS — I6523 Occlusion and stenosis of bilateral carotid arteries: Secondary | ICD-10-CM

## 2016-08-30 MED ORDER — CILOSTAZOL 100 MG PO TABS: 100.0000 mg | ORAL_TABLET | Freq: Two times a day (BID) | ORAL | 3 refills | Status: DC

## 2016-08-30 MED ORDER — CILOSTAZOL 100 MG PO TABS: 100.0000 mg | ORAL_TABLET | Freq: Two times a day (BID) | ORAL | 0 refills | Status: DC

## 2016-08-30 NOTE — Progress Notes (Signed)
Cardiology Office Note Date:  08/31/2016   ID:  Brett [name] 1940-11-09, MRN [id number]  PCP:  Garret Reddish, MD  Cardiologist:  Sherren Mocha, MD    Chief Complaint  Patient presents with  . Coronary Artery Disease    follow up     History of Present Illness: Brett [name] is a 76 y.o. male who presents for presents for follow-up of aortic stenosis, coronary artery disease s/p CABG, and carotid disease. The patient underwent multivessel CABG 11 years ago.    The patient is here with his wife today. He just had a prostate biopsy yesterday for evaluation of elevated PSA. From a cardiac perspective he reports no symptoms. He specifically denies chest pain, chest pressure, shortness of breath, or leg swelling. He continues to work as a Teacher, early years/pre. He denies lightheadedness, syncope, or heart palpitations. He complains of generalized fatigue.   Past Medical History:  Diagnosis Date  . CAD (coronary artery disease) of artery bypass graft   . Carotid stenosis   . Elevated PSA   . 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 Hyperglycemia   . Hyperlipidemia   . Hyperplastic colon polyp 11/24/2009   x7  . Hypertension   . Iron deficiency anemia due to chronic blood loss 08/15/2016  . Leukocytosis 08/02/2016  . 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  . PVD (peripheral vascular disease) ([place])     Past Surgical History:  Procedure Laterality Date  . CORONARY ARTERY BYPASS GRAFT  2006    Current Outpatient Prescriptions  Medication Sig Dispense Refill  . alfuzosin (UROXATRAL) 10 MG 24 hr tablet Take 10 mg by mouth at bedtime.  99  . Ascorbic Acid (VITAMIN C) 500 MG tablet Take 500 mg by mouth daily.      Marland Kitchen atorvastatin (LIPITOR) 40 MG tablet Take 1 tablet (40 mg total) by mouth daily. 90 tablet 3  . Calcium Carbonate-Vitamin D (CALTRATE 600+D) 600-400 MG-UNIT per tablet Take 1 tablet by mouth daily.      . cilostazol (PLETAL) 100 MG tablet Take 1 tablet  (100 mg total) by mouth 2 (two) times daily. 180 tablet 3  . doxycycline (VIBRA-TABS) 100 MG tablet Take 1 tablet (100 mg total) by mouth 2 (two) times daily. 20 tablet 0  . Multiple Vitamins-Minerals (DIASENSE MULTIVITAMIN) TABS Take 1 tablet by mouth.      . niacin (NIASPAN) 1000 MG CR tablet Take 1,000 mg by mouth at bedtime.     No current facility-administered medications for this visit.     Allergies:   Patient has no known allergies.   Social History:  The patient  reports that he has quit smoking. His smoking use included Cigarettes. He has a 40.00 pack-year smoking history. He quit smokeless tobacco use about 8 months ago. He reports that he drinks alcohol. He reports that he does not use drugs.   Family History:  The patient's  family history includes Dementia in his mother; Heart attack in his father.    ROS:  Please see the history of present illness.  Otherwise, review of systems is positive for urinary frequency.  All other systems are reviewed and negative.    PHYSICAL EXAM: VS:  BP (!) 110/50   Pulse 100   Ht 5' 6.75" (1.695 m)   Wt 134 lb 1.9 oz (60.8 kg)   BMI 21.16 kg/m  , BMI Body mass index is 21.16 kg/m. GEN: Thin elderly male in no acute distress  HEENT: normal  Neck: no JVD, no masses. bilateral carotid bruits Cardiac: RRR with 3/6 harsh late peaking systolic murmur at the RUSB     Respiratory:  clear to auscultation bilaterally, normal work of breathing GI: soft, nontender, nondistended, + BS MS: no deformity or atrophy  Ext: no pretibial edema Skin: warm and dry, no rash Neuro:  Strength and sensation are intact Psych: euthymic mood, full affect  EKG:  EKG is not ordered today.  Recent Labs: 06/08/2016: ALT 18 06/14/2016: BUN 13; Creatinine, Ser 1.16; Potassium 4.0; Sodium 139 08/02/2016: HGB 11.4; Platelets 320   Lipid Panel     Component Value Date/Time   CHOL 128 03/17/2016 0756   TRIG 80.0 03/17/2016 0756   TRIG 93 06/25/2006 0901   HDL  52.20 03/17/2016 0756   CHOLHDL 2 03/17/2016 0756   VLDL 16.0 03/17/2016 0756   LDLCALC 60 03/17/2016 0756      Wt Readings from Last 3 Encounters:  08/30/16 134 lb 1.9 oz (60.8 kg)  08/02/16 136 lb (61.7 kg)  06/14/16 141 lb 6.4 oz (64.1 kg)     Cardiac Studies Reviewed: 2D echo 11-25-15: Study Conclusions  - Left ventricle: The cavity size was normal. There was mild focal   basal hypertrophy of the septum. Systolic function was vigorous.   The estimated ejection fraction was in the range of 65% to 70%.   Wall motion was normal; there were no regional wall motion   abnormalities. Doppler parameters are consistent with abnormal   left ventricular relaxation (grade 1 diastolic dysfunction). - Aortic valve: Valve mobility was restricted. There was severe   stenosis. There was trivial regurgitation. - Mitral valve: Severely calcified annulus. There was systolic   anterior motion of the chordal structures. There was mild   regurgitation. - Left atrium: The atrium was moderately dilated. - Atrial septum: There was an atrial septal aneurysm.  Impressions:  - Vigorous LV function; grade 1 diastolic dysfunction; proximal   septal thickening with chordal SAM and resting LVOT gradient of   2.5 m/s; heavily calcificed aortic valve with severe AS (mean   gradient 42 mmHg) and trace AI; severe MAC with mild MR; moderate   LAE.  GXT 01-18-2016: Study Highlights   1. There was no ST segment deviation noted during stress.   Study performed for evaluation of asymptomatic severe aortic stenosis  Normal resting EKG  No chest pain or significant ST change with exertion  No hypotension associated with exercise  Recommend: continue observation/serial echo follow-up   Carotid Duplex 11-25-2015: Impressions Duplex imaging, with color Doppler, of the carotid arteries reveals heterogenous plaque with shadowing, the bifurcations and ICA's, bilaterally. Bilateral ICA velocites are  elevated and have decreased as compared to prior exam. The subclavian arteries are widely patent, with normal velocity flow. The vertebral arteries are patent with antegrade flow, bilaterally. Technologist Notes Plaque Plaque Examination Data cm/s cm/s Stable heterogeneous plaque, bilaterally. Stable 1-39% RICA stenosis. Stable 31-49% LICA stenosis. Normal subclavian arteries, bilaterally. Patent vertebral arteries with antegrade flow. f/u 1 year  ASSESSMENT AND PLAN: 1.  Severe, stage CI, aortic stenosis: The patient appears to be asymptomatic at this time. He had a treadmill study last year without any high risk features. I have reviewed his most recent echocardiogram and plan to repeat an echocardiogram in April at a one-year interval from his previous study. There is some question whether his aortic stenosis might be contributing to hemolysis. There is no association between the 2 and I will touch base with  Dr. Marin Olp. He otherwise currently does not have an indication for aortic valve replacement as he is asymptomatic.  2. Coronary artery disease, native vessel: The patient is status post remote CABG. He has no symptoms of angina. He is doing well on current medications which will be continued without change.  3. Hyperlipidemia: Treated with atorvastatin 40 mg.  4. Hypertension: His blood pressure is now controlled off of all medication. His ARB has been stopped in the past few months and we recommended that he not resume this. Renal function improved after he discontinued the ARB. Suspect this was due to hypoperfusion in the setting of low blood pressure.  Current medicines are reviewed with the patient today.  The patient does not have concerns regarding medicines.  Labs/ tests ordered today include:   Orders Placed This Encounter  Procedures  . ECHOCARDIOGRAM COMPLETE    Disposition:   FU 6 months  Signed, Sherren Mocha, MD  08/31/2016 11:42 AM    [facility] [address], [place], [place]  [address] Phone: [phone]; Fax: [phone]

## 2016-08-30 NOTE — Patient Instructions (Addendum)
Medication Instructions:  Your physician recommends that you continue on your current medications as directed. Please refer to the Current Medication list given to you today.  Please remain off of your blood pressure medications.   Labwork: No new orders.   Testing/Procedures: Your physician has requested that you have an echocardiogram in April. Echocardiography is a painless test that uses sound waves to create images of your heart. It provides your doctor with information about the size and shape of your heart and how well your heart's chambers and valves are working. This procedure takes approximately one hour. There are no restrictions for this procedure.  Your physician has requested that you have a carotid duplex in April. This test is an ultrasound of the carotid arteries in your neck. It looks at blood flow through these arteries that supply the brain with blood. Allow one hour for this exam. There are no restrictions or special instructions.  Follow-Up: Your physician wants you to follow-up in: 6 MONTHS with Dr Burt Knack.  You will receive a reminder letter in the mail two months in advance. If you don't receive a letter, please call our office to schedule the follow-up appointment.   Any Other Special Instructions Will Be Listed Below (If Applicable).     If you need a refill on your cardiac medications before your next appointment, please call your pharmacy.

## 2016-09-04 DIAGNOSIS — R351 Nocturia: Secondary | ICD-10-CM | POA: Diagnosis not present

## 2016-09-04 DIAGNOSIS — R972 Elevated prostate specific antigen [PSA]: Secondary | ICD-10-CM | POA: Diagnosis not present

## 2016-09-04 DIAGNOSIS — N401 Enlarged prostate with lower urinary tract symptoms: Secondary | ICD-10-CM | POA: Diagnosis not present

## 2016-09-15 ENCOUNTER — Telehealth: Payer: Self-pay

## 2016-09-15 NOTE — Telephone Encounter (Signed)
Left message for pt to call back to schedule colon. Date open for Dr. Hilarie Fredrickson at [facility] is 11/21/16.  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 Pt would like to try in June when school is out. Will call pt back to schedule. Reminder in epic.

## 2016-09-15 NOTE — Telephone Encounter (Signed)
-----   Message from Algernon Huxley, RN sent at 06/14/2016  3:50 PM EST ----- Regarding: Colon Pt needs procedure scheduled at hospital in April

## 2016-09-21 ENCOUNTER — Ambulatory Visit (HOSPITAL_BASED_OUTPATIENT_CLINIC_OR_DEPARTMENT_OTHER): Payer: Medicare Other | Admitting: Family

## 2016-09-21 ENCOUNTER — Other Ambulatory Visit (HOSPITAL_BASED_OUTPATIENT_CLINIC_OR_DEPARTMENT_OTHER): Payer: Medicare Other

## 2016-09-21 ENCOUNTER — Ambulatory Visit: Payer: Medicare Other

## 2016-09-21 VITALS — BP 129/61 | HR 105 | Temp 97.8°F | Resp 18 | Wt 135.0 lb

## 2016-09-21 DIAGNOSIS — D509 Iron deficiency anemia, unspecified: Secondary | ICD-10-CM

## 2016-09-21 DIAGNOSIS — D72829 Elevated white blood cell count, unspecified: Secondary | ICD-10-CM | POA: Diagnosis not present

## 2016-09-21 DIAGNOSIS — D631 Anemia in chronic kidney disease: Secondary | ICD-10-CM

## 2016-09-21 DIAGNOSIS — D500 Iron deficiency anemia secondary to blood loss (chronic): Secondary | ICD-10-CM | POA: Diagnosis not present

## 2016-09-21 DIAGNOSIS — D72823 Leukemoid reaction: Secondary | ICD-10-CM

## 2016-09-21 LAB — CBC WITH DIFFERENTIAL (CANCER CENTER ONLY)
BASO#: 0 10*3/uL (ref 0.0–0.2)
BASO%: 0.2 % (ref 0.0–2.0)
EOS%: 0.6 % (ref 0.0–7.0)
Eosinophils Absolute: 0.1 10*3/uL (ref 0.0–0.5)
HEMATOCRIT: 36.1 % — AB (ref 38.7–49.9)
HEMOGLOBIN: 12.5 g/dL — AB (ref 13.0–17.1)
LYMPH#: 1.7 10*3/uL (ref 0.9–3.3)
LYMPH%: 17.2 % (ref 14.0–48.0)
MCH: 34.7 pg — ABNORMAL HIGH (ref 28.0–33.4)
MCHC: 34.6 g/dL (ref 32.0–35.9)
MCV: 100 fL — ABNORMAL HIGH (ref 82–98)
MONO#: 0.7 10*3/uL (ref 0.1–0.9)
MONO%: 6.8 % (ref 0.0–13.0)
NEUT#: 7.6 10*3/uL — ABNORMAL HIGH (ref 1.5–6.5)
NEUT%: 75.2 % (ref 40.0–80.0)
Platelets: 255 10*3/uL (ref 145–400)
RBC: 3.6 10*6/uL — AB (ref 4.20–5.70)
RDW: 15.2 % (ref 11.1–15.7)
WBC: 10.1 10*3/uL — AB (ref 4.0–10.0)

## 2016-09-21 LAB — IRON AND TIBC
%SAT: 39 % (ref 20–55)
Iron: 95 ug/dL (ref 42–163)
TIBC: 246 ug/dL (ref 202–409)
UIBC: 151 ug/dL (ref 117–376)

## 2016-09-21 LAB — CHCC SATELLITE - SMEAR

## 2016-09-21 LAB — FERRITIN: Ferritin: 377 ng/ml — ABNORMAL HIGH (ref 22–316)

## 2016-09-21 NOTE — Progress Notes (Signed)
Hematology and Oncology Follow Up Visit  Brett [name] [id number] March 13, 1941 76 y.o. 09/21/2016   Principle Diagnosis:  Chronic mild leukocytosis  Iron deficiency anemia   Current Therapy:   IV iron as indicated    Interim History:  Brett [name] is here today for follow-up. He is doing well but has still had come mild fatigue. He received one dose of IV iron in January. His CBC today looks better. His WBC count is now 10.1 and Hgb is 12.5 with an MCV of 100.  He will be having a colonoscopy in June once school is out which is much easier for him.  He states that his prostate biopsy was negative and he has now started taking Uroxatral.  He has had no issue with infections. No fever, chills, n/v, cough, rash, dizziness, SOB, chest pain, palpitations, abdominal pain or changes in bowel or bladder habits.  He has had no episodes of bleeding, bruising or petechiae. No lymphadenopathy found on exam.  His appetite comes and goes. He is staying hydrated and his weight is stable.   Medications:  Allergies as of 09/21/2016   No Known Allergies     Medication List       Accurate as of 09/21/16 10:10 AM. Always use your most recent med list.          alfuzosin 10 MG 24 hr tablet Commonly known as:  UROXATRAL Take 10 mg by mouth at bedtime.   atorvastatin 40 MG tablet Commonly known as:  LIPITOR Take 1 tablet (40 mg total) by mouth daily.   CALTRATE 600+D 600-400 MG-UNIT tablet Generic drug:  Calcium Carbonate-Vitamin D Take 1 tablet by mouth daily.   cilostazol 100 MG tablet Commonly known as:  PLETAL Take 1 tablet (100 mg total) by mouth 2 (two) times daily.   DIASENSE MULTIVITAMIN Tabs Take 1 tablet by mouth.   doxycycline 100 MG tablet Commonly known as:  VIBRA-TABS Take 1 tablet (100 mg total) by mouth 2 (two) times daily.   niacin 1000 MG CR tablet Commonly known as:  NIASPAN Take 1,000 mg by mouth at bedtime.   vitamin C 500 MG tablet Commonly known as:   ASCORBIC ACID Take 500 mg by mouth daily.       Allergies: No Known Allergies  Past Medical History, Surgical history, Social history, and Family History were reviewed and updated.  Review of Systems: All other 10 point review of systems is negative.   Physical Exam:  weight is 135 lb (61.2 kg). His oral temperature is 97.8 F (36.6 C). His blood pressure is 129/61 and his pulse is 105 (abnormal). His respiration is 18 and oxygen saturation is 99%.   Wt Readings from Last 3 Encounters:  09/21/16 135 lb (61.2 kg)  08/30/16 134 lb 1.9 oz (60.8 kg)  08/02/16 136 lb (61.7 kg)    Ocular: Sclerae unicteric, pupils equal, round and reactive to light Ear-nose-throat: Oropharynx clear, dentition fair Lymphatic: No cervical supraclavicular or axillary adenopathy Lungs no rales or rhonchi, good excursion bilaterally Heart regular rate and rhythm, no murmur appreciated Abd soft, nontender, positive bowel sounds, no liver or spleen tip palpated on exam MSK no focal spinal tenderness, no joint edema Neuro: non-focal, well-oriented, appropriate affect Breasts: Deferred  Lab Results  Component Value Date   WBC 10.1 (H) 09/21/2016   HGB 12.5 (L) 09/21/2016   HCT 36.1 (L) 09/21/2016   MCV 100 (H) 09/21/2016   PLT 255 09/21/2016   Lab Results  Component  Value Date   FERRITIN 334 (H) 08/02/2016   IRON 43 08/02/2016   TIBC 258 08/02/2016   UIBC 215 08/02/2016   IRONPCTSAT 17 (L) 08/02/2016   Lab Results  Component Value Date   RBC 3.60 (L) 09/21/2016   Lab Results  Component Value Date   KAPLAMBRATIO 1.30 08/02/2016   Lab Results  Component Value Date   IGGSERUM 869 08/02/2016   IGMSERUM 113 08/02/2016   Lab Results  Component Value Date   MSPIKE Not Observed 08/02/2016     Chemistry      Component Value Date/Time   NA 139 06/14/2016 0942   K 4.0 06/14/2016 0942   CL 103 06/14/2016 0942   CO2 31 06/14/2016 0942   BUN 13 06/14/2016 0942   CREATININE 1.16 06/14/2016  0942      Component Value Date/Time   CALCIUM 9.9 06/14/2016 0942   ALKPHOS 80 06/08/2016 1002   AST 22 06/08/2016 1002   ALT 18 06/08/2016 1002   BILITOT 0.7 06/08/2016 1002     Impression and Plan: Brett [name] is a very pleasant 76 yo white male with chronic mild leukocytosis and iron deficiency anemia. He received IV iron in January and is counts appear to have improved. He is still having some mild fatigue at times.  His WBC count is now 10.1. We will see what his iron studies show and plan to bring him back in next week for an infusion if needed.  We will go ahead and plan to see him back in 2 months for repeat lab work and follow-up.  Both he and his wife know to contact our office with any questions or concerns. We can certainly see him sooner if need be.   Eliezer Bottom, NP 2/15/201810:10 AM

## 2016-09-22 LAB — RETICULOCYTES: Reticulocyte Count: 4.1 % — ABNORMAL HIGH (ref 0.6–2.6)

## 2016-09-27 ENCOUNTER — Telehealth: Payer: Self-pay | Admitting: Internal Medicine

## 2016-09-27 ENCOUNTER — Other Ambulatory Visit: Payer: Self-pay

## 2016-09-27 DIAGNOSIS — Z1211 Encounter for screening for malignant neoplasm of colon: Secondary | ICD-10-CM

## 2016-09-27 NOTE — Telephone Encounter (Signed)
Pt scheduled for colon at [facility] 11/21/16@9 :30am, pt to arrive there at 8am. Message left on pts home phone per his request.

## 2016-10-02 ENCOUNTER — Telehealth: Payer: Self-pay

## 2016-10-02 NOTE — Telephone Encounter (Signed)
Colon in April cancelled. Will reschedule colon at the hospital when schedule for June-August is out. Reminder in epic.

## 2016-10-07 ENCOUNTER — Other Ambulatory Visit: Payer: Self-pay | Admitting: Cardiovascular Disease

## 2016-11-07 ENCOUNTER — Other Ambulatory Visit: Payer: Self-pay

## 2016-11-07 ENCOUNTER — Ambulatory Visit (HOSPITAL_COMMUNITY)
Admission: RE | Admit: 2016-11-07 | Discharge: 2016-11-07 | Disposition: A | Discharge status: Home / Self Care | Payer: Medicare Other | Source: Ambulatory Visit | Attending: Cardiovascular Disease | Admitting: Cardiovascular Disease

## 2016-11-07 ENCOUNTER — Ambulatory Visit (HOSPITAL_COMMUNITY): Payer: Medicare Other | Attending: Cardiology

## 2016-11-07 DIAGNOSIS — E785 Hyperlipidemia, unspecified: Secondary | ICD-10-CM | POA: Insufficient documentation

## 2016-11-07 DIAGNOSIS — I10 Essential (primary) hypertension: Secondary | ICD-10-CM | POA: Insufficient documentation

## 2016-11-07 DIAGNOSIS — I083 Combined rheumatic disorders of mitral, aortic and tricuspid valves: Secondary | ICD-10-CM | POA: Insufficient documentation

## 2016-11-07 DIAGNOSIS — I6523 Occlusion and stenosis of bilateral carotid arteries: Secondary | ICD-10-CM

## 2016-11-07 DIAGNOSIS — I350 Nonrheumatic aortic (valve) stenosis: Secondary | ICD-10-CM

## 2016-11-07 DIAGNOSIS — Z87891 Personal history of nicotine dependence: Secondary | ICD-10-CM | POA: Diagnosis not present

## 2016-11-07 DIAGNOSIS — I739 Peripheral vascular disease, unspecified: Secondary | ICD-10-CM | POA: Diagnosis not present

## 2016-11-07 DIAGNOSIS — I2510 Atherosclerotic heart disease of native coronary artery without angina pectoris: Secondary | ICD-10-CM | POA: Insufficient documentation

## 2016-11-16 ENCOUNTER — Encounter: Payer: Self-pay | Admitting: Cardiovascular Disease

## 2016-11-16 ENCOUNTER — Other Ambulatory Visit: Payer: Self-pay | Admitting: *Deleted

## 2016-11-16 ENCOUNTER — Other Ambulatory Visit: Payer: Self-pay | Admitting: Cardiovascular Disease

## 2016-11-16 ENCOUNTER — Ambulatory Visit (INDEPENDENT_AMBULATORY_CARE_PROVIDER_SITE_OTHER): Payer: Medicare Other | Admitting: Cardiovascular Disease

## 2016-11-16 VITALS — BP 124/62 | HR 85 | Ht 66.75 in | Wt 136.0 lb

## 2016-11-16 DIAGNOSIS — I350 Nonrheumatic aortic (valve) stenosis: Secondary | ICD-10-CM | POA: Diagnosis not present

## 2016-11-16 DIAGNOSIS — I6523 Occlusion and stenosis of bilateral carotid arteries: Secondary | ICD-10-CM

## 2016-11-16 NOTE — Progress Notes (Signed)
Cardiology Office Note Date:  11/18/2016   ID:  Brett [name], DOB Dec 25, 1940, MRN [id number]  PCP:  Garret Reddish, MD  Cardiologist:  Sherren Mocha, MD    No chief complaint on file.    History of Present Illness: Brett [name] is a 76 y.o. male who presents for follow-up of aortic valve stenosis. I have followed him for several years for CAD after he underwent CABG by Dr [name] in 2006 (LIMA-LAD, SVG-D2, SVG-OM3, sequential SVG-OM4/PDA). Over recent years he has developed progressive aortic stenosis, with peak and mean transvalvular gradients in April 2017 of 71 and 42 mmHg, respectively. The patient reported no symptoms at the time. A treadmill study was performed where he exercised 5 min 42 sec according to the Bruce Protocol without any high risk features. The patient underwent a repeat echo study 11/07/16 demonstrating progressive AS with peak and mean gradients now 131 and 75 mmHg.   He is here with his wife today. 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 He initially denies chest pain, shortness of breath, lightheadedness, or other cardiac-related symptoms. However, his wife reports the patient has significant dyspnea with walking up an incline or any significant distance. Also reports his worsening fatigue.   He has been followed at the [facility] (Dr Marin Olp) for leukocytosis and iron deficiency anemia. He has received iron infusions. The patient continues to smoke cigarettes.   Past Medical History:  Diagnosis Date  . CAD (coronary artery disease) of artery bypass graft   . Carotid stenosis   . Elevated PSA   . 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 Hyperglycemia   . Hyperlipidemia   . Hyperplastic colon polyp 11/24/2009   x7  . Hypertension   . Iron deficiency anemia due to chronic blood loss 08/15/2016  . Leukocytosis 08/02/2016  . 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  . PVD (peripheral vascular disease) ([place])     Past Surgical  History:  Procedure Laterality Date  . CORONARY ARTERY BYPASS GRAFT  2006    Current Outpatient Prescriptions  Medication Sig Dispense Refill  . alfuzosin (UROXATRAL) 10 MG 24 hr tablet Take 10 mg by mouth at bedtime.  99  . Ascorbic Acid (VITAMIN C) 500 MG tablet Take 500 mg by mouth daily.      Marland Kitchen atorvastatin (LIPITOR) 40 MG tablet Take 1 tablet (40 mg total) by mouth daily. 90 tablet 3  . Calcium Carbonate-Vitamin D (CALTRATE 600+D) 600-400 MG-UNIT per tablet Take 1 tablet by mouth daily.      . cilostazol (PLETAL) 100 MG tablet Take 1 tablet (100 mg total) by mouth 2 (two) times daily. 180 tablet 3  . finasteride (PROSCAR) 5 MG tablet Take 5 mg by mouth daily.  3  . niacin (NIASPAN) 1000 MG CR tablet Take 1,000 mg by mouth at bedtime.    Marland Kitchen acetaminophen (TYLENOL) 500 MG tablet Take 1,000 mg by mouth 3 (three) times daily.    . Inositol Niacinate (NIACIN FLUSH FREE) 500 MG CAPS Take 500 mg by mouth at bedtime.    . Multiple Vitamin (MULTIVITAMIN WITH MINERALS) TABS tablet Take 1 tablet by mouth daily.     No current facility-administered medications for this visit.     Allergies:   Patient has no known allergies.   Social History:  The patient  reports that he has quit smoking. His smoking use included Cigarettes. He has a 40.00 pack-year smoking history. He quit smokeless tobacco use about a year ago.  He reports that he drinks alcohol. He reports that he does not use drugs.   Family History:  The patient's  family history includes Dementia in his mother; Heart attack in his father.    ROS:  Please see the history of present illness.  Otherwise, review of systems is positive for hearing loss, cough.  All other systems are reviewed and negative.    PHYSICAL EXAM: VS:  BP 124/62   Pulse 85   Ht 5' 6.75" (1.695 m)   Wt 136 lb (61.7 kg)   SpO2 95%   BMI 21.46 kg/m  , BMI Body mass index is 21.46 kg/m. GEN: thin, elderly male, in no acute distress  HEENT: normal  Neck: no JVD,  no masses. bilateral carotid bruits Cardiac: tachycardic and regular with 16/6 harsh systolic murmur at the RUSB Respiratory:  clear to auscultation bilaterally, normal work of breathing GI: soft, nontender, nondistended, + BS MS: no deformity or atrophy  Ext: no pretibial edema Skin: warm and dry, no rash Neuro:  Strength and sensation are intact Psych: euthymic mood, full affect  EKG:  EKG is ordered today. The ekg ordered today shows sinus tach 101 bpm, voltage criteria for LVH  Recent Labs: 06/08/2016: ALT 18 09/21/2016: HGB 12.5 11/16/2016: BUN 16; Creatinine, Ser 0.90; Platelets 221; Potassium 4.3; Sodium 143   Lipid Panel     Component Value Date/Time   CHOL 128 03/17/2016 0756   TRIG 80.0 03/17/2016 0756   TRIG 93 06/25/2006 0901   HDL 52.20 03/17/2016 0756   CHOLHDL 2 03/17/2016 0756   VLDL 16.0 03/17/2016 0756   LDLCALC 60 03/17/2016 0756      Wt Readings from Last 3 Encounters:  11/16/16 136 lb (61.7 kg)  09/21/16 135 lb (61.2 kg)  08/30/16 134 lb 1.9 oz (60.8 kg)     Cardiac Studies Reviewed: 2D echo: Study Conclusions  - Left ventricle: The cavity size was normal. There was severe   concentric hypertrophy. Systolic function was vigorous. The   estimated ejection fraction was in the range of 65% to 70%. Wall   motion was normal; there were no regional wall motion   abnormalities. Doppler parameters are consistent with abnormal   left ventricular relaxation (grade 1 diastolic dysfunction).   Doppler parameters are consistent with elevated ventricular   end-diastolic filling pressure. - Aortic valve: Trileaflet; severely thickened, severely calcified   leaflets. Valve mobility was restricted. There was critical   stenosis. Mean gradient (S): 75 mm Hg. Peak gradient (S): 131 mm   Hg. - Aortic root: The aortic root was normal in size. - Mitral valve: There was mild regurgitation. - Left atrium: The atrium was mildly dilated. - Right ventricle: The cavity  size was normal. Wall thickness was   normal. Systolic function was normal. - Right atrium: The atrium was normal in size. - Tricuspid valve: There was mild regurgitation. - Pulmonic valve: There was trivial regurgitation. - Pulmonary arteries: Systolic pressure was within the normal   range. - Inferior vena cava: The vessel was normal in size. - Pericardium, extracardiac: There was no pericardial effusion.  Impressions:  - Critical aortic stenosis, mean aortic stenosis 75 mmHg.  ASSESSMENT AND PLAN: 1.  Severe Stage D aortic stenosis with NYHA functional class II sx's of chronic diastolic heart failure 2. CAD s/p CABG without angina 3. Iron deficiency anemia 4. Tobacco 5. Hyperlipidemia  I have reviewed the natural history of aortic stenosis with the patient and his wife who is present  today. The patient clearly has symptomatic aortic stenosis with exertional dyspena and fatigue. We have discussed the limitations of medical therapy and the poor prognosis associated with symptomatic aortic stenosis. We have reviewed potential treatment options, including palliative medical therapy, conventional surgical aortic valve replacement, and transcatheter aortic valve replacement. We discussed treatment options in the context of this patient's specific comorbid medical conditions.   Considering his previous cardiac surgery, TAVR might be a reasonable treatment option. He understands the need for further evaluation with right/left heart catheterization and CT studies. He will then undergo formal cardiac surgical evaluation by Dr [name].   I have reviewed the risks, indications, and alternatives to cardiac catheterization, possible angioplasty, and stenting with the patient. Risks include but are not limited to bleeding, infection, vascular injury, stroke, myocardial infection, arrhythmia, kidney injury, radiation-related injury in the case of prolonged fluoroscopy use, emergency cardiac surgery, and  death. The patient understands the risks of serious complication is 1-2 in 6644 with diagnostic cardiac cath and 1-2% or less with angioplasty/stenting.   Current medicines are reviewed with the patient today.  The patient does not have concerns regarding medicines.  Labs/ tests ordered today include:   Orders Placed This Encounter  Procedures  . CT ANGIO CHEST AORTA W/CM &/OR WO/CM  . CT Angio Abd/Pel w/ and/or w/o  . CT CORONARY MORPH W/CTA COR W/SCORE W/CA W/CM &/OR WO/CM  . CT CORONARY FRACTIONAL FLOW RESERVE DATA PREP  . Basic Metabolic Panel (BMET)  . CBC  . INR/PT  . EKG 12-Lead    Disposition:   FU pending test results  Signed, Sherren Mocha, MD  11/18/2016 11:44 PM    [facility] [place], [place], [place]  [address] Phone: [phone]; Fax: [phone]

## 2016-11-16 NOTE — Patient Instructions (Addendum)
Medication Instructions:  Your physician recommends that you continue on your current medications as directed. Please refer to the Current Medication list given to you today.  Labwork: Your physician recommends that you have lab work today: BMP, CBC and PT/INR  Testing/Procedures: Your physician has requested that you have a cardiac catheterization (11/21/2016). Cardiac catheterization is used to diagnose and/or treat various heart conditions. Doctors may recommend this procedure for a number of different reasons. The most common reason is to evaluate chest pain. Chest pain can be a symptom of coronary artery disease (CAD), and cardiac catheterization can show whether plaque is narrowing or blocking your heart's arteries. This procedure is also used to evaluate the valves, as well as measure the blood flow and oxygen levels in different parts of your heart. For further information please visit [email or web address]. Please follow instruction sheet, as given.   Your physician has requested that you have cardiac CT (11/27/2016). Cardiac computed tomography (CT) is a painless test that uses an x-ray machine to take clear, detailed pictures of your heart. For further information please visit [email or web address]. Please follow instruction sheet as given.  (11/27/16) Non-Cardiac CT Angiography (CTA), is a special type of CT scan that uses a computer to produce multi-dimensional views of major blood vessels throughout the body. In CT angiography, a contrast material is injected through an IV to help visualize the blood vessels (CTA Chest/abdomen/pelvis)  CT instructions: You are scheduled for CT scans on Monday, April 23 please arrive in Admitting at [facility] at 8:30 AM  No solid foods, caffeine or smoking 4 hours prior to CT No herbal supplements 24 hours prior to test No sexual enhancement drugs 72 hours prior to test  Follow-Up: We will arrange evaluation with Dr [name] to further discuss TAVR.   Levonne Spiller RN is the contact in Dr Guy Sandifer office [phone]  Any Other Special Instructions Will Be Listed Below (If Applicable).     If you need a refill on your cardiac medications before your next appointment, please call your pharmacy.

## 2016-11-17 LAB — BASIC METABOLIC PANEL
BUN/Creatinine Ratio: 18 (ref 10–24)
BUN: 16 mg/dL (ref 8–27)
CALCIUM: 9.9 mg/dL (ref 8.6–10.2)
CO2: 28 mmol/L (ref 18–29)
CREATININE: 0.9 mg/dL (ref 0.76–1.27)
Chloride: 102 mmol/L (ref 96–106)
GFR calc Af Amer: 96 mL/min/{1.73_m2} (ref >59)
GFR, EST NON AFRICAN AMERICAN: 83 mL/min/{1.73_m2} (ref >59)
GLUCOSE: 140 mg/dL — AB (ref 65–99)
Potassium: 4.3 mmol/L (ref 3.5–5.2)
Sodium: 143 mmol/L (ref 134–144)

## 2016-11-17 LAB — CBC
HEMATOCRIT: 38.4 % (ref 37.5–51.0)
HEMOGLOBIN: 12.8 g/dL — AB (ref 13.0–17.7)
MCH: 33 pg (ref 26.6–33.0)
MCHC: 33.3 g/dL (ref 31.5–35.7)
MCV: 99 fL — AB (ref 79–97)
PLATELETS: 221 10*3/uL (ref 150–379)
RBC: 3.88 x10E6/uL — AB (ref 4.14–5.80)
RDW: 13.9 % (ref 12.3–15.4)
WBC: 9 10*3/uL (ref 3.4–10.8)

## 2016-11-17 LAB — PROTIME-INR
INR: 1 (ref 0.8–1.2)
Prothrombin Time: 11 s (ref 9.1–12.0)

## 2016-11-20 ENCOUNTER — Other Ambulatory Visit: Payer: Self-pay | Admitting: Cardiovascular Disease

## 2016-11-20 DIAGNOSIS — I350 Nonrheumatic aortic (valve) stenosis: Secondary | ICD-10-CM

## 2016-11-21 ENCOUNTER — Encounter (HOSPITAL_COMMUNITY): Payer: Self-pay

## 2016-11-21 ENCOUNTER — Other Ambulatory Visit: Payer: Self-pay

## 2016-11-21 ENCOUNTER — Telehealth: Payer: Self-pay

## 2016-11-21 ENCOUNTER — Ambulatory Visit (HOSPITAL_COMMUNITY)
Admission: RE | Admit: 2016-11-21 | Discharge: 2016-11-21 | Disposition: A | Discharge status: Home / Self Care | Payer: Medicare Other | Source: Ambulatory Visit | Attending: Cardiovascular Disease | Admitting: Cardiovascular Disease

## 2016-11-21 ENCOUNTER — Encounter (HOSPITAL_COMMUNITY)
Admission: RE | Disposition: A | Discharge status: Home / Self Care | Payer: Self-pay | Source: Ambulatory Visit | Attending: Cardiovascular Disease

## 2016-11-21 ENCOUNTER — Ambulatory Visit (HOSPITAL_COMMUNITY): Admit: 2016-11-21 | Payer: Medicare Other | Admitting: Internal Medicine

## 2016-11-21 DIAGNOSIS — I2510 Atherosclerotic heart disease of native coronary artery without angina pectoris: Secondary | ICD-10-CM | POA: Diagnosis not present

## 2016-11-21 DIAGNOSIS — Z951 Presence of aortocoronary bypass graft: Secondary | ICD-10-CM | POA: Insufficient documentation

## 2016-11-21 DIAGNOSIS — I5032 Chronic diastolic (congestive) heart failure: Secondary | ICD-10-CM | POA: Diagnosis not present

## 2016-11-21 DIAGNOSIS — I6529 Occlusion and stenosis of unspecified carotid artery: Secondary | ICD-10-CM | POA: Insufficient documentation

## 2016-11-21 DIAGNOSIS — R739 Hyperglycemia, unspecified: Secondary | ICD-10-CM | POA: Insufficient documentation

## 2016-11-21 DIAGNOSIS — I2584 Coronary atherosclerosis due to calcified coronary lesion: Secondary | ICD-10-CM | POA: Diagnosis not present

## 2016-11-21 DIAGNOSIS — I2582 Chronic total occlusion of coronary artery: Secondary | ICD-10-CM | POA: Insufficient documentation

## 2016-11-21 DIAGNOSIS — F17210 Nicotine dependence, cigarettes, uncomplicated: Secondary | ICD-10-CM | POA: Diagnosis not present

## 2016-11-21 DIAGNOSIS — I110 Hypertensive heart disease with heart failure: Secondary | ICD-10-CM | POA: Diagnosis not present

## 2016-11-21 DIAGNOSIS — I350 Nonrheumatic aortic (valve) stenosis: Secondary | ICD-10-CM

## 2016-11-21 DIAGNOSIS — E785 Hyperlipidemia, unspecified: Secondary | ICD-10-CM | POA: Diagnosis not present

## 2016-11-21 DIAGNOSIS — I739 Peripheral vascular disease, unspecified: Secondary | ICD-10-CM | POA: Insufficient documentation

## 2016-11-21 DIAGNOSIS — D509 Iron deficiency anemia, unspecified: Secondary | ICD-10-CM | POA: Diagnosis not present

## 2016-11-21 DIAGNOSIS — Z1211 Encounter for screening for malignant neoplasm of colon: Secondary | ICD-10-CM

## 2016-11-21 HISTORY — PX: RIGHT HEART CATH AND CORONARY/GRAFT ANGIOGRAPHY: CATH118265

## 2016-11-21 SURGERY — COLONOSCOPY
Anesthesia: Monitor Anesthesia Care

## 2016-11-21 SURGERY — RIGHT HEART CATH AND CORONARY/GRAFT ANGIOGRAPHY
Anesthesia: LOCAL

## 2016-11-21 MED ORDER — ONDANSETRON HCL 4 MG/2ML IJ SOLN: 4.0000 mg | Freq: Four times a day (QID) | INTRAMUSCULAR | Status: DC | PRN

## 2016-11-21 MED ORDER — SODIUM CHLORIDE 0.9% FLUSH: 3.0000 mL | Freq: Two times a day (BID) | INTRAVENOUS | Status: DC

## 2016-11-21 MED ORDER — VERAPAMIL HCL 2.5 MG/ML IV SOLN
INTRAVENOUS | Status: DC | PRN
  Administered 2016-11-21: 10 mL via INTRA_ARTERIAL

## 2016-11-21 MED ORDER — ASPIRIN 81 MG PO CHEW
CHEWABLE_TABLET | ORAL | Status: AC
  Administered 2016-11-21: 81 mg
  Filled 2016-11-21: qty 1

## 2016-11-21 MED ORDER — ASPIRIN 81 MG PO CHEW
81.0000 mg | CHEWABLE_TABLET | ORAL | Status: DC
Start: 2016-11-22 — End: 2016-11-21

## 2016-11-21 MED ORDER — SODIUM CHLORIDE 0.9 % WEIGHT BASED INFUSION: 1.0000 mL/kg/h | INTRAVENOUS | Status: DC

## 2016-11-21 MED ORDER — SODIUM CHLORIDE 0.9 % IV SOLN: 250.0000 mL | INTRAVENOUS | Status: DC | PRN

## 2016-11-21 MED ORDER — HEPARIN (PORCINE) IN NACL 2-0.9 UNIT/ML-% IJ SOLN
INTRAMUSCULAR | Status: DC | PRN
  Administered 2016-11-21: 1000 mL

## 2016-11-21 MED ORDER — MIDAZOLAM HCL 2 MG/2ML IJ SOLN
INTRAMUSCULAR | Status: DC | PRN
  Administered 2016-11-21: 2 mg via INTRAVENOUS

## 2016-11-21 MED ORDER — SODIUM CHLORIDE 0.9% FLUSH: 3.0000 mL | INTRAVENOUS | Status: DC | PRN

## 2016-11-21 MED ORDER — IOPAMIDOL (ISOVUE-370) INJECTION 76%
INTRAVENOUS | Status: DC | PRN
  Administered 2016-11-21: 70 mL via INTRA_ARTERIAL

## 2016-11-21 MED ORDER — FENTANYL CITRATE (PF) 100 MCG/2ML IJ SOLN
INTRAMUSCULAR | Status: DC | PRN
  Administered 2016-11-21: 25 ug via INTRAVENOUS

## 2016-11-21 MED ORDER — LIDOCAINE HCL (PF) 1 % IJ SOLN
INTRAMUSCULAR | Status: AC
  Filled 2016-11-21: qty 30

## 2016-11-21 MED ORDER — SODIUM CHLORIDE 0.9 % WEIGHT BASED INFUSION
3.0000 mL/kg/h | INTRAVENOUS | Status: DC
  Administered 2016-11-21: 3 mL/kg/h via INTRAVENOUS

## 2016-11-21 MED ORDER — IOPAMIDOL (ISOVUE-370) INJECTION 76%
INTRAVENOUS | Status: AC
  Filled 2016-11-21: qty 100

## 2016-11-21 MED ORDER — HEPARIN SODIUM (PORCINE) 1000 UNIT/ML IJ SOLN
INTRAMUSCULAR | Status: DC | PRN
  Administered 2016-11-21: 3000 [IU] via INTRAVENOUS

## 2016-11-21 MED ORDER — VERAPAMIL HCL 2.5 MG/ML IV SOLN
INTRAVENOUS | Status: AC
  Filled 2016-11-21: qty 2

## 2016-11-21 MED ORDER — HEPARIN SODIUM (PORCINE) 1000 UNIT/ML IJ SOLN
INTRAMUSCULAR | Status: AC
  Filled 2016-11-21: qty 1

## 2016-11-21 MED ORDER — FENTANYL CITRATE (PF) 100 MCG/2ML IJ SOLN
INTRAMUSCULAR | Status: AC
  Filled 2016-11-21: qty 2

## 2016-11-21 MED ORDER — IOPAMIDOL (ISOVUE-370) INJECTION 76%
INTRAVENOUS | Status: AC
  Filled 2016-11-21: qty 50

## 2016-11-21 MED ORDER — HEPARIN (PORCINE) IN NACL 2-0.9 UNIT/ML-% IJ SOLN
INTRAMUSCULAR | Status: AC
  Filled 2016-11-21: qty 1000

## 2016-11-21 MED ORDER — MIDAZOLAM HCL 2 MG/2ML IJ SOLN
INTRAMUSCULAR | Status: AC
  Filled 2016-11-21: qty 2

## 2016-11-21 MED ORDER — ACETAMINOPHEN 325 MG PO TABS: 650.0000 mg | ORAL_TABLET | ORAL | Status: DC | PRN

## 2016-11-21 MED ORDER — LIDOCAINE HCL (PF) 1 % IJ SOLN
INTRAMUSCULAR | Status: DC | PRN
  Administered 2016-11-21 (×2): 2 mL via INTRADERMAL

## 2016-11-21 SURGICAL SUPPLY — 14 items
CATH BALLN WEDGE 5F 110CM (CATHETERS) ×2 IMPLANT
CATH INFINITI 5 FR AL2 (CATHETERS) ×2 IMPLANT
CATH INFINITI 5FR AL1 (CATHETERS) ×2 IMPLANT
CATH INFINITI 5FR MULTPACK ANG (CATHETERS) ×2 IMPLANT
DEVICE RAD COMP TR BAND LRG (VASCULAR PRODUCTS) ×2 IMPLANT
GLIDESHEATH SLEND SS 6F .021 (SHEATH) ×2 IMPLANT
GUIDEWIRE INQWIRE 1.5J.035X260 (WIRE) ×1 IMPLANT
INQWIRE 1.5J .035X260CM (WIRE) ×2
KIT HEART LEFT (KITS) ×2 IMPLANT
PACK CARDIAC CATHETERIZATION (CUSTOM PROCEDURE TRAY) ×2 IMPLANT
SHEATH GLIDE SLENDER 4/5FR (SHEATH) ×2 IMPLANT
SYR MEDRAD MARK V 150ML (SYRINGE) ×2 IMPLANT
TRANSDUCER W/STOPCOCK (MISCELLANEOUS) ×2 IMPLANT
TUBING CIL FLEX 10 FLL-RA (TUBING) ×2 IMPLANT

## 2016-11-21 NOTE — H&P (View-Only) (Signed)
Cardiology Office Note Date:  11/18/2016   ID:  Brett [name], DOB 07-19-1941, MRN [id number]  PCP:  Garret Reddish, MD  Cardiologist:  Sherren Mocha, MD    No chief complaint on file.    History of Present Illness: Brett [name] is a 76 y.o. male who presents for follow-up of aortic valve stenosis. I have followed him for several years for CAD after he underwent CABG by Dr [name] in 2006 (LIMA-LAD, SVG-D2, SVG-OM3, sequential SVG-OM4/PDA). Over recent years he has developed progressive aortic stenosis, with peak and mean transvalvular gradients in April 2017 of 71 and 42 mmHg, respectively. The patient reported no symptoms at the time. A treadmill study was performed where he exercised 5 min 42 sec according to the Bruce Protocol without any high risk features. The patient underwent a repeat echo study 11/07/16 demonstrating progressive AS with peak and mean gradients now 131 and 75 mmHg.   He is here with his wife today. 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 He initially denies chest pain, shortness of breath, lightheadedness, or other cardiac-related symptoms. However, his wife reports the patient has significant dyspnea with walking up an incline or any significant distance. Also reports his worsening fatigue.   He has been followed at the [facility] (Dr Marin Olp) for leukocytosis and iron deficiency anemia. He has received iron infusions. The patient continues to smoke cigarettes.   Past Medical History:  Diagnosis Date  . CAD (coronary artery disease) of artery bypass graft   . Carotid stenosis   . Elevated PSA   . 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 Hyperglycemia   . Hyperlipidemia   . Hyperplastic colon polyp 11/24/2009   x7  . Hypertension   . Iron deficiency anemia due to chronic blood loss 08/15/2016  . Leukocytosis 08/02/2016  . 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  . PVD (peripheral vascular disease) ([place])     Past Surgical  History:  Procedure Laterality Date  . CORONARY ARTERY BYPASS GRAFT  2006    Current Outpatient Prescriptions  Medication Sig Dispense Refill  . alfuzosin (UROXATRAL) 10 MG 24 hr tablet Take 10 mg by mouth at bedtime.  99  . Ascorbic Acid (VITAMIN C) 500 MG tablet Take 500 mg by mouth daily.      Marland Kitchen atorvastatin (LIPITOR) 40 MG tablet Take 1 tablet (40 mg total) by mouth daily. 90 tablet 3  . Calcium Carbonate-Vitamin D (CALTRATE 600+D) 600-400 MG-UNIT per tablet Take 1 tablet by mouth daily.      . cilostazol (PLETAL) 100 MG tablet Take 1 tablet (100 mg total) by mouth 2 (two) times daily. 180 tablet 3  . finasteride (PROSCAR) 5 MG tablet Take 5 mg by mouth daily.  3  . niacin (NIASPAN) 1000 MG CR tablet Take 1,000 mg by mouth at bedtime.    Marland Kitchen acetaminophen (TYLENOL) 500 MG tablet Take 1,000 mg by mouth 3 (three) times daily.    . Inositol Niacinate (NIACIN FLUSH FREE) 500 MG CAPS Take 500 mg by mouth at bedtime.    . Multiple Vitamin (MULTIVITAMIN WITH MINERALS) TABS tablet Take 1 tablet by mouth daily.     No current facility-administered medications for this visit.     Allergies:   Patient has no known allergies.   Social History:  The patient  reports that he has quit smoking. His smoking use included Cigarettes. He has a 40.00 pack-year smoking history. He quit smokeless tobacco use about a year ago.  He reports that he drinks alcohol. He reports that he does not use drugs.   Family History:  The patient's  family history includes Dementia in his mother; Heart attack in his father.    ROS:  Please see the history of present illness.  Otherwise, review of systems is positive for hearing loss, cough.  All other systems are reviewed and negative.    PHYSICAL EXAM: VS:  BP 124/62   Pulse 85   Ht 5' 6.75" (1.695 m)   Wt 136 lb (61.7 kg)   SpO2 95%   BMI 21.46 kg/m  , BMI Body mass index is 21.46 kg/m. GEN: thin, elderly male, in no acute distress  HEENT: normal  Neck: no JVD,  no masses. bilateral carotid bruits Cardiac: tachycardic and regular with 81/4 harsh systolic murmur at the RUSB Respiratory:  clear to auscultation bilaterally, normal work of breathing GI: soft, nontender, nondistended, + BS MS: no deformity or atrophy  Ext: no pretibial edema Skin: warm and dry, no rash Neuro:  Strength and sensation are intact Psych: euthymic mood, full affect  EKG:  EKG is ordered today. The ekg ordered today shows sinus tach 101 bpm, voltage criteria for LVH  Recent Labs: 06/08/2016: ALT 18 09/21/2016: HGB 12.5 11/16/2016: BUN 16; Creatinine, Ser 0.90; Platelets 221; Potassium 4.3; Sodium 143   Lipid Panel     Component Value Date/Time   CHOL 128 03/17/2016 0756   TRIG 80.0 03/17/2016 0756   TRIG 93 06/25/2006 0901   HDL 52.20 03/17/2016 0756   CHOLHDL 2 03/17/2016 0756   VLDL 16.0 03/17/2016 0756   LDLCALC 60 03/17/2016 0756      Wt Readings from Last 3 Encounters:  11/16/16 136 lb (61.7 kg)  09/21/16 135 lb (61.2 kg)  08/30/16 134 lb 1.9 oz (60.8 kg)     Cardiac Studies Reviewed: 2D echo: Study Conclusions  - Left ventricle: The cavity size was normal. There was severe   concentric hypertrophy. Systolic function was vigorous. The   estimated ejection fraction was in the range of 65% to 70%. Wall   motion was normal; there were no regional wall motion   abnormalities. Doppler parameters are consistent with abnormal   left ventricular relaxation (grade 1 diastolic dysfunction).   Doppler parameters are consistent with elevated ventricular   end-diastolic filling pressure. - Aortic valve: Trileaflet; severely thickened, severely calcified   leaflets. Valve mobility was restricted. There was critical   stenosis. Mean gradient (S): 75 mm Hg. Peak gradient (S): 131 mm   Hg. - Aortic root: The aortic root was normal in size. - Mitral valve: There was mild regurgitation. - Left atrium: The atrium was mildly dilated. - Right ventricle: The cavity  size was normal. Wall thickness was   normal. Systolic function was normal. - Right atrium: The atrium was normal in size. - Tricuspid valve: There was mild regurgitation. - Pulmonic valve: There was trivial regurgitation. - Pulmonary arteries: Systolic pressure was within the normal   range. - Inferior vena cava: The vessel was normal in size. - Pericardium, extracardiac: There was no pericardial effusion.  Impressions:  - Critical aortic stenosis, mean aortic stenosis 75 mmHg.  ASSESSMENT AND PLAN: 1.  Severe Stage D aortic stenosis with NYHA functional class II sx's of chronic diastolic heart failure 2. CAD s/p CABG without angina 3. Iron deficiency anemia 4. Tobacco 5. Hyperlipidemia  I have reviewed the natural history of aortic stenosis with the patient and his wife who is present  today. The patient clearly has symptomatic aortic stenosis with exertional dyspena and fatigue. We have discussed the limitations of medical therapy and the poor prognosis associated with symptomatic aortic stenosis. We have reviewed potential treatment options, including palliative medical therapy, conventional surgical aortic valve replacement, and transcatheter aortic valve replacement. We discussed treatment options in the context of this patient's specific comorbid medical conditions.   Considering his previous cardiac surgery, TAVR might be a reasonable treatment option. He understands the need for further evaluation with right/left heart catheterization and CT studies. He will then undergo formal cardiac surgical evaluation by Dr [name].   I have reviewed the risks, indications, and alternatives to cardiac catheterization, possible angioplasty, and stenting with the patient. Risks include but are not limited to bleeding, infection, vascular injury, stroke, myocardial infection, arrhythmia, kidney injury, radiation-related injury in the case of prolonged fluoroscopy use, emergency cardiac surgery, and  death. The patient understands the risks of serious complication is 1-2 in 8832 with diagnostic cardiac cath and 1-2% or less with angioplasty/stenting.   Current medicines are reviewed with the patient today.  The patient does not have concerns regarding medicines.  Labs/ tests ordered today include:   Orders Placed This Encounter  Procedures  . CT ANGIO CHEST AORTA W/CM &/OR WO/CM  . CT Angio Abd/Pel w/ and/or w/o  . CT CORONARY MORPH W/CTA COR W/SCORE W/CA W/CM &/OR WO/CM  . CT CORONARY FRACTIONAL FLOW RESERVE DATA PREP  . Basic Metabolic Panel (BMET)  . CBC  . INR/PT  . EKG 12-Lead    Disposition:   FU pending test results  Signed, Sherren Mocha, MD  11/18/2016 11:44 PM    [facility] [place], [place], [place]  [address] Phone: [phone]; Fax: [phone]

## 2016-11-21 NOTE — Telephone Encounter (Signed)
-----   Message from Algernon Huxley, RN sent at 10/02/2016 10:18 AM EST ----- Regarding: Colon Pt needs hospital colon in June, July, or August

## 2016-11-21 NOTE — Telephone Encounter (Signed)
Pts colon scheduled at [facility] 02/02/17@10 :30am. Left message for pt to call back.

## 2016-11-21 NOTE — Interval H&P Note (Signed)
History and Physical Interval Note:  11/21/2016 10:33 AM  Brett [name]  has presented today for surgery, with the diagnosis of arotic stenosis  The various methods of treatment have been discussed with the patient and family. After consideration of risks, benefits and other options for treatment, the patient has consented to  Procedure(s): Right/Left Heart Cath and Coronary Angiography (N/A) as a surgical intervention .  The patient's history has been reviewed, patient examined, no change in status, stable for surgery.  I have reviewed the patient's chart and labs.  Questions were answered to the patient's satisfaction.     Sherren Mocha

## 2016-11-21 NOTE — Discharge Instructions (Signed)
Radial Site Care °Refer to this sheet in the next few weeks. These instructions provide you with information about caring for yourself after your procedure. Your health care provider may also give you more specific instructions. Your treatment has been planned according to current medical practices, but problems sometimes occur. Call your health care provider if you have any problems or questions after your procedure. °What can I expect after the procedure? °After your procedure, it is typical to have the following: °· Bruising at the radial site that usually fades within 1-2 weeks. °· Blood collecting in the tissue (hematoma) that may be painful to the touch. It should usually decrease in size and tenderness within 1-2 weeks. °Follow these instructions at home: °· Take medicines only as directed by your health care provider. °· You may shower 24-48 hours after the procedure or as directed by your health care provider. Remove the bandage (dressing) and gently wash the site with plain soap and water. Pat the area dry with a clean towel. Do not rub the site, because this may cause bleeding. °· Do not take baths, swim, or use a hot tub until your health care provider approves. °· Check your insertion site every day for redness, swelling, or drainage. °· Do not apply powder or lotion to the site. °· Do not flex or bend the affected arm for 24 hours or as directed by your health care provider. °· Do not push or pull heavy objects with the affected arm for 24 hours or as directed by your health care provider. °· Do not lift over 10 lb (4.5 kg) for 5 days after your procedure or as directed by your health care provider. °· Ask your health care provider when it is okay to: °¨ Return to work or school. °¨ Resume usual physical activities or sports. °¨ Resume sexual activity. °· Do not drive home if you are discharged the same day as the procedure. Have someone else drive you. °· You may drive 24 hours after the procedure  unless otherwise instructed by your health care provider. °· Do not operate machinery or power tools for 24 hours after the procedure. °· If your procedure was done as an outpatient procedure, which means that you went home the same day as your procedure, a responsible adult should be with you for the first 24 hours after you arrive home. °· Keep all follow-up visits as directed by your health care provider. This is important. °Contact a health care provider if: °· You have a fever. °· You have chills. °· You have increased bleeding from the radial site. Hold pressure on the site. CALL 911 °Get help right away if: °· You have unusual pain at the radial site. °· You have redness, warmth, or swelling at the radial site. °· You have drainage (other than a small amount of blood on the dressing) from the radial site. °· The radial site is bleeding, and the bleeding does not stop after 30 minutes of holding steady pressure on the site. °· Your arm or hand becomes pale, cool, tingly, or numb. °This information is not intended to replace advice given to you by your health care provider. Make sure you discuss any questions you have with your health care provider. °Document Released: 08/26/2010 Document Revised: 12/30/2015 Document Reviewed: 02/09/2014 °Elsevier Interactive Patient Education © 2017 [employer] ° ° °

## 2016-11-22 ENCOUNTER — Other Ambulatory Visit: Payer: Medicare Other

## 2016-11-22 ENCOUNTER — Ambulatory Visit: Payer: Medicare Other | Admitting: Hematology & Oncology

## 2016-11-22 ENCOUNTER — Encounter (HOSPITAL_COMMUNITY): Payer: Self-pay | Admitting: Cardiovascular Disease

## 2016-11-22 LAB — POCT I-STAT 3, ART BLOOD GAS (G3+)
Acid-Base Excess: 1 mmol/L (ref 0.0–2.0)
BICARBONATE: 25.1 mmol/L (ref 20.0–28.0)
O2 Saturation: 94 %
TCO2: 26 mmol/L (ref 0–100)
pCO2 arterial: 39.1 mmHg (ref 32.0–48.0)
pH, Arterial: 7.415 (ref 7.350–7.450)
pO2, Arterial: 70 mmHg — ABNORMAL LOW (ref 83.0–108.0)

## 2016-11-22 LAB — POCT I-STAT 3, VENOUS BLOOD GAS (G3P V)
BICARBONATE: 24.2 mmol/L (ref 20.0–28.0)
O2 SAT: 74 %
PCO2 VEN: 37.1 mmHg — AB (ref 44.0–60.0)
PO2 VEN: 38 mmHg (ref 32.0–45.0)
TCO2: 25 mmol/L (ref 0–100)
pH, Ven: 7.422 (ref 7.250–7.430)

## 2016-11-22 NOTE — Telephone Encounter (Signed)
Spoke with pts wife and pt is having heart surgery and will need to postpone his colon. Appt cancelled and pt will call back when he is cleared to have colon done.

## 2016-11-23 ENCOUNTER — Other Ambulatory Visit: Payer: Self-pay | Admitting: *Deleted

## 2016-11-23 DIAGNOSIS — I350 Nonrheumatic aortic (valve) stenosis: Secondary | ICD-10-CM

## 2016-11-26 ENCOUNTER — Other Ambulatory Visit: Payer: Self-pay | Admitting: Cardiovascular Disease

## 2016-11-27 ENCOUNTER — Ambulatory Visit (HOSPITAL_COMMUNITY)
Admission: RE | Admit: 2016-11-27 | Discharge: 2016-11-27 | Disposition: A | Discharge status: Home / Self Care | Payer: Medicare Other | Source: Ambulatory Visit | Attending: Cardiovascular Disease | Admitting: Cardiovascular Disease

## 2016-11-27 ENCOUNTER — Ambulatory Visit (HOSPITAL_COMMUNITY): Admission: RE | Admit: 2016-11-27 | Payer: Medicare Other | Source: Ambulatory Visit

## 2016-11-27 ENCOUNTER — Encounter: Payer: Self-pay | Admitting: Physical Therapy

## 2016-11-27 ENCOUNTER — Ambulatory Visit: Payer: Medicare Other | Attending: Cardiovascular Disease | Admitting: Physical Therapy

## 2016-11-27 DIAGNOSIS — I700 Atherosclerosis of aorta: Secondary | ICD-10-CM | POA: Diagnosis not present

## 2016-11-27 DIAGNOSIS — I350 Nonrheumatic aortic (valve) stenosis: Secondary | ICD-10-CM | POA: Insufficient documentation

## 2016-11-27 DIAGNOSIS — K5730 Diverticulosis of large intestine without perforation or abscess without bleeding: Secondary | ICD-10-CM | POA: Diagnosis not present

## 2016-11-27 DIAGNOSIS — I2510 Atherosclerotic heart disease of native coronary artery without angina pectoris: Secondary | ICD-10-CM | POA: Diagnosis not present

## 2016-11-27 DIAGNOSIS — R2689 Other abnormalities of gait and mobility: Secondary | ICD-10-CM | POA: Diagnosis not present

## 2016-11-27 DIAGNOSIS — J439 Emphysema, unspecified: Secondary | ICD-10-CM | POA: Diagnosis not present

## 2016-11-27 DIAGNOSIS — N200 Calculus of kidney: Secondary | ICD-10-CM | POA: Insufficient documentation

## 2016-11-27 DIAGNOSIS — Z951 Presence of aortocoronary bypass graft: Secondary | ICD-10-CM | POA: Insufficient documentation

## 2016-11-27 LAB — PULMONARY FUNCTION TEST
DL/VA % PRED: 55 %
DL/VA: 2.38 ml/min/mmHg/L
DLCO cor % pred: 48 %
DLCO cor: 13.04 ml/min/mmHg
DLCO unc % pred: 45 %
DLCO unc: 12.33 ml/min/mmHg
FEF 25-75 PRE: 2.52 L/s
FEF 25-75 Post: 2.39 L/sec
FEF2575-%CHANGE-POST: -5 %
FEF2575-%Pred-Post: 129 %
FEF2575-%Pred-Pre: 136 %
FEV1-%Change-Post: 0 %
FEV1-%PRED-PRE: 102 %
FEV1-%Pred-Post: 102 %
FEV1-Post: 2.62 L
FEV1-Pre: 2.62 L
FEV1FVC-%CHANGE-POST: -4 %
FEV1FVC-%Pred-Pre: 108 %
FEV6-%CHANGE-POST: 4 %
FEV6-%PRED-PRE: 100 %
FEV6-%Pred-Post: 104 %
FEV6-PRE: 3.34 L
FEV6-Post: 3.48 L
FEV6FVC-%Change-Post: 0 %
FEV6FVC-%PRED-PRE: 107 %
FEV6FVC-%Pred-Post: 107 %
FVC-%CHANGE-POST: 4 %
FVC-%PRED-PRE: 93 %
FVC-%Pred-Post: 98 %
FVC-Post: 3.49 L
FVC-Pre: 3.34 L
POST FEV6/FVC RATIO: 100 %
Post FEV1/FVC ratio: 75 %
Pre FEV1/FVC ratio: 78 %
Pre FEV6/FVC Ratio: 100 %
RV % PRED: 123 %
RV: 2.9 L
TLC % pred: 102 %
TLC: 6.38 L

## 2016-11-27 MED ORDER — METOPROLOL TARTRATE 5 MG/5ML IV SOLN
5.0000 mg | INTRAVENOUS | Status: DC | PRN
  Administered 2016-11-27 (×2): 5 mg via INTRAVENOUS
  Filled 2016-11-27: qty 5

## 2016-11-27 MED ORDER — ALBUTEROL SULFATE (2.5 MG/3ML) 0.083% IN NEBU
2.5000 mg | INHALATION_SOLUTION | Freq: Once | RESPIRATORY_TRACT | Status: AC
  Administered 2016-11-27: 2.5 mg via RESPIRATORY_TRACT

## 2016-11-27 MED ORDER — METOPROLOL TARTRATE 5 MG/5ML IV SOLN
INTRAVENOUS | Status: AC
  Filled 2016-11-27: qty 5

## 2016-11-27 MED ORDER — IOPAMIDOL (ISOVUE-370) INJECTION 76%
INTRAVENOUS | Status: AC
  Administered 2016-11-27: 80 mL
  Filled 2016-11-27: qty 100

## 2016-11-27 MED ORDER — METOPROLOL TARTRATE 5 MG/5ML IV SOLN
INTRAVENOUS | Status: AC
  Filled 2016-11-27: qty 10

## 2016-11-27 MED ORDER — IOPAMIDOL (ISOVUE-370) INJECTION 76%
INTRAVENOUS | Status: AC
  Administered 2016-11-27: 70 mL
  Filled 2016-11-27: qty 50

## 2016-11-27 NOTE — Therapy (Signed)
[place], [place], [address] Phone: [phone]   Fax:  [phone]  Physical Therapy Evaluation  Patient Details  Name: Brett [name] MRN: [id number] Date of Birth: 10-20-1940 Referring Provider: Dr. Talbot Grumbling  Encounter Date: 11/27/2016      PT End of Session - 11/27/16 1425    Visit Number 1   PT Start Time 7425   PT Stop Time 1319   PT Time Calculation (min) 48 min      Past Medical History:  Diagnosis Date  . CAD (coronary artery disease) of artery bypass graft   . Carotid stenosis   . Elevated PSA   . 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 Hyperglycemia   . Hyperlipidemia   . Hyperplastic colon polyp 11/24/2009   x7  . Hypertension   . Iron deficiency anemia due to chronic blood loss 08/15/2016  . Leukocytosis 08/02/2016  . 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  . PVD (peripheral vascular disease) ([place])     Past Surgical History:  Procedure Laterality Date  . CORONARY ARTERY BYPASS GRAFT  2006  . RIGHT HEART CATH AND CORONARY/GRAFT ANGIOGRAPHY N/A 11/21/2016   Procedure: Right Heart Cath and Coronary/Graft Angiography;  Surgeon: Sherren Mocha, MD;  Location: [facility];  Service: Cardiovascular;  Laterality: N/A;    There were no vitals filed for this visit.       Subjective Assessment - 11/27/16 1237    Subjective Pt reports some shortness of breath with activity and some increased fatigue over past 6 months.    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 No/denies            [facility] PT Assessment - 11/27/16 0001      Assessment   Medical Diagnosis severe aortic stenosis   Referring Provider Dr. 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  No   Is the patient reluctant to leave their home because of a fear of falling?  No     Home Environment   Living Environment Private residence   Living Arrangements Spouse/significant other   Type of [place] Access Stairs to enter   Entrance Stairs-Number of Steps 5   Entrance Stairs-Rails Right;Left;Cannot reach both   [place] Two level   Alternate Level Stairs-Rails Left     Posture/Postural Control   Posture/Postural Control Postural limitations   Postural Limitations Forward head  mild   Posture Comments mild     ROM / Strength   AROM / PROM / Strength AROM;Strength     AROM   Overall AROM Comments grossly WNL with a little discomfort in UE ER and IR due to rotator cuff issues bil.      Strength   Overall Strength Comments grossly 4/5 to 4+/5 throughout   Strength Assessment Site Hand   Right/Left hand Right;Left   Right Hand Grip (lbs) 73  R hand dominant   Left Hand Grip (lbs) 65     Ambulation/Gait   Gait Comments Pt ambulates without  significant deviations. Gait distance limited by 30% for age/gender due to oxygen desaturation.           [facility] Pre-Surgical Assessment - 12/22/2016 0001    5 Meter Walk Test- trial 1 5 sec   5 Meter Walk Test- trial 2 4 sec.    5 Meter Walk Test- trial 3 5 sec.   5 meter walk test average 4.67 sec   4 Stage Balance Test tolerated for:  10 sec.   4 Stage Balance Test Position 4   Sit To Stand Test- trial 1 9 sec.   ADL/IADL Independent with: Bathing;Dressing;Finances;Meal prep;Yard work   ADL/IADL Therapist, sports Index Vulnerable   6 Minute Walk- Baseline yes   BP (mmHg) 122/66   HR (bpm) 64   02 Sat (%RA) 98 %   Modified Borg Scale for Dyspnea 0- Nothing at all   Perceived Rate of Exertion (Borg) 6-   6 Minute Walk Post Test yes   BP (mmHg) 146/77   HR (bpm) 80   02  Sat (%RA) 85 %   Modified Borg Scale for Dyspnea 1- Very mild shortness of breath  between 1&2   Perceived Rate of Exertion (Borg) 9- very light   Aerobic Endurance Distance Walked 1200   Endurance additional comments Asked patient to take a rest at 3:45 (740') due to oxygen dropping to 85%. Pt recovered within 30 seconds and was able to complete the test.                         PT Education - 12/22/2016 1427    Education provided Yes   Education Details importance of rest when shortness of breath begins   Person(s) Educated Patient;Spouse   Methods Explanation   Comprehension Verbalized understanding                    Plan - 22-Dec-2016 1428    Clinical Impression Statement see below   Consulted and Agree with Plan of Care Patient;Family member/caregiver     Clinical Impression Statement: Pt is a 76 yo male presenting to OP PT for evaluation prior to possible TAVR surgery due to severe aortic stenosis. Pt reports onset of shortness of breath with activity approximately 6 months ago. Symptoms are not limiting him significantly at this time but wife reports noticing changes in past 6+ months. Pt presents with good overall ROM and strength, good balance and is not at high fall risk 4 stage balance test, good walking speed and fair aerobic endurance per 6 minute walk test. Pt ambulated a total of 1200 feet in 6 minute walk. I did ask patient to rest at 3:45 (740') due to oxygen desaturation of 85%. Pt recovered within 30 seconds and able ot resume for the remainder of the test. Based on the Short Physical Performance Battery, patient has a frailty rating of 12/12 with </= 5/12 considered frail.   Patient demonstrated the following deficits and impairments:     Visit Diagnosis: Other abnormalities of gait and mobility      G-Codes - 2016/12/22 1429    Functional Assessment Tool Used (Outpatient Only) 6 minute wak 1200'   Functional Limitation Mobility: Walking  and moving around   Mobility: Walking and Moving Around Current Status (Y7741) At least 20 percent but less than 40 percent impaired, limited or restricted   Mobility: Walking and Moving Around Goal Status (O8786) At least 20 percent but less than 40 percent impaired, limited  or restricted   Mobility: Walking and Moving Around Discharge Status [phone]) At least 20 percent but less than 40 percent impaired, limited or restricted       Problem List Patient Active Problem List   Diagnosis Date Noted  . Iron deficiency anemia due to chronic blood loss 08/15/2016  . 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  . 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 Elevated PSA 03/17/2016  . Pulmonary nodule 06/12/2014  . Severe aortic stenosis 08/15/2011  . TOBACCO ABUSE 07/26/2009  . CAD, ARTERY BYPASS GRAFT 07/26/2009  . CAROTID STENOSIS 07/26/2009  . Peripheral vascular disease ([place]) 07/26/2009  . Hyperlipidemia 11/22/2007  . Essential hypertension 02/05/2007    Romualdo Bolk, PT 11/27/2016, 2:29 PM  [facility] [address] [place], [place], [address] Phone: [phone]   Fax:  [phone]  Name: Brett [name] MRN: [id number] Date of Birth: 1940-10-21

## 2016-11-27 NOTE — Telephone Encounter (Signed)
Medication Detail    Disp Refills Start End   cilostazol (PLETAL) 100 MG tablet 180 tablet 3 08/30/2016    Sig - Route: Take 1 tablet (100 mg total) by mouth 2 (two) times daily. - Oral   E-Prescribing Status: Receipt confirmed by pharmacy (08/30/2016 10:51 AM EST)   Pharmacy   [facility], [place]

## 2016-11-28 ENCOUNTER — Institutional Professional Consult (permissible substitution) (INDEPENDENT_AMBULATORY_CARE_PROVIDER_SITE_OTHER): Payer: Medicare Other | Admitting: Thoracic Surgery (Cardiothoracic Vascular Surgery)

## 2016-11-28 ENCOUNTER — Encounter: Payer: Self-pay | Admitting: Thoracic Surgery (Cardiothoracic Vascular Surgery)

## 2016-11-28 VITALS — BP 116/61 | HR 96 | Resp 20 | Ht 66.75 in | Wt 136.0 lb

## 2016-11-28 DIAGNOSIS — I6523 Occlusion and stenosis of bilateral carotid arteries: Secondary | ICD-10-CM

## 2016-11-28 DIAGNOSIS — I350 Nonrheumatic aortic (valve) stenosis: Secondary | ICD-10-CM | POA: Diagnosis not present

## 2016-11-28 DIAGNOSIS — Z951 Presence of aortocoronary bypass graft: Secondary | ICD-10-CM | POA: Diagnosis not present

## 2016-11-28 NOTE — Progress Notes (Signed)
HEART AND [place] [facility]GERY CONSULTATION REPORT  Referring Provider is Sherren Mocha, MD PCP is Garret Reddish, MD  Chief Complaint  Patient presents with  . Aortic Stenosis    Further discuss TAVR scheduled for 5/1/208, review all studies    HPI:  Patient is a 76 year old male with history of coronary artery disease status post coronary artery bypass grafting in the remote past, aortic stenosis, hypertension, hyperlipidemia, and tobacco abuse who has been referred for surgical consultation to discuss treatment options for management of severe symptomatic aortic stenosis. The patient's cardiac history dates back to 2006 when he was found to have an abnormal electrocardiogram and underwent a stress Cardiolite exam that was abnormal. Diagnostic cardiac catheterization was performed and revealed severe three-vessel coronary artery disease with normal left ventricular function. The patient underwent uncomplicated coronary artery bypass grafting 5.  He did well for many years but developed aortic stenosis which has gradually progressed on follow-up echocardiograms.  Transthoracic echocardiogram performed in April 2017 revealed peak velocity across the aortic valve measured 4.2 m/s corresponding to mean transvalvular gradient of 42 mmHg. Left ventricular function remained normal with ejection fraction estimated 65-70%. The patient has been followed for several years by Dr. Burt Knack and remained essentially asymptomatic until recently.  He was found to have iron deficient anemia and began to complain of exertional fatigue and shortness of breath. Symptoms did not improve following treatment of his anemia. Follow-up transthoracic echocardiogram performed 11/07/2016 revealed further progression of disease with peak velocity across the aortic valve ranging between 4 and 5 m/s corresponding to mean transvalvular gradient estimated as high 75 mmHg.  Left ventricular systolic function remains normal.  Diagnostic cardiac catheterization performed 11/21/2016 revealed severe native coronary artery disease but continued patency of all 5 previously placed bypass grafts with no significant vein graft disease.  Right heart pressures were normal. The patient underwent CT angiography and has been referred for elective surgical consultation.  The patient is married and lives locally in [place] with his wife. He continues to work as a Teacher, early years/pre for [employer]. He has remained physically active all of his adult life and only recently he has began to experience symptoms of exertional shortness breath and fatigue with decreased energy and loss of appetite.  He only gets short of breath with moderate exertion such as walking uphill. He is comfortable with low-level activity.  He has not had any chest pain or chest tightness either with activity or at rest. He denies any resting shortness breath, PND, orthopnea, or lower extremity edema. He has not had dizzy spells, palpitations, nor syncope.   Past Medical History:  Diagnosis Date  . Carotid stenosis   . Elevated PSA   . 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 Hyperglycemia   . Hyperlipidemia   . Hyperplastic colon polyp 11/24/2009   x7  . Hypertension   . Iron deficiency anemia due to chronic blood loss 08/15/2016  . Leukocytosis 08/02/2016  . 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  . PVD (peripheral vascular disease) ([place])   . 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 CORONARY ARTERY BYPASS GRAFT  12/07/2004   CABG x5 Dr [name]  . RIGHT HEART CATH AND CORONARY/GRAFT ANGIOGRAPHY N/A 11/21/2016   Procedure: Right Heart Cath and Coronary/Graft Angiography;  Surgeon: Sherren Mocha, MD;  Location: [facility];  Service: Cardiovascular;   Laterality: N/A;    Family History  Problem Relation Age of Onset  . Heart attack Father   . Dementia Mother   . Colon cancer Neg Hx   . Colon polyps Neg Hx   . Rectal cancer Neg Hx   . Stomach cancer Neg Hx     Social History   Social History  . Marital status: Married    Spouse name: N/A  . Number of children: 3  . Years of education: N/A   Occupational History  . Retired    Social History Main Topics  . Smoking status: Former Smoker    Packs/day: 1.00    Years: 40.00    Types: Cigarettes  . Smokeless tobacco: Former Systems developer    Quit date: 12/06/2015  . Alcohol use 0.0 oz/week     Comment: occasionaly  . Drug use: No  . Sexual activity: Not on file   Other Topics Concern  . Not on file   Social History Narrative   Married 1988 2nd marriage. 1 child from 2nd marriage (1994). 2 children from first marriage. 3-4 grandkids-broken relationship with his son though.       Retired from [employer] after 36 years in 1999. 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       Hobbies: gardening, formerly bowling and pool, family time    Current Outpatient Prescriptions  Medication Sig Dispense Refill  . acetaminophen (TYLENOL) 500 MG tablet Take 1,000 mg by mouth 3 (three) times daily.    Marland Kitchen alfuzosin (UROXATRAL) 10 MG 24 hr tablet Take 10 mg by mouth at bedtime.  99  . Ascorbic Acid (VITAMIN C) 500 MG tablet Take 500 mg by mouth daily.      Marland Kitchen atorvastatin (LIPITOR) 40 MG tablet Take 1 tablet (40 mg total) by mouth daily. 90 tablet 3  . Calcium Carbonate-Vitamin D (CALTRATE 600+D) 600-400 MG-UNIT per tablet Take 1 tablet by mouth daily.      . cilostazol (PLETAL) 100 MG tablet Take 1 tablet (100 mg total) by mouth 2 (two) times daily. 180 tablet 3  . finasteride (PROSCAR) 5 MG tablet Take 5 mg by mouth daily.  3  . Inositol Niacinate (NIACIN FLUSH FREE) 500 MG CAPS Take 500 mg by mouth at bedtime.    . Multiple Vitamin (MULTIVITAMIN WITH MINERALS) TABS tablet Take 1  tablet by mouth daily.    . niacin (NIASPAN) 1000 MG CR tablet Take 1,000 mg by mouth at bedtime.     No current facility-administered medications for this visit.     No Known Allergies    Review of Systems:   General:  decreased appetite, decreased energy, no weight gain, + weight loss, no fever  Cardiac:  no chest pain with exertion, no chest pain at rest, + SOB with exertion, no resting SOB, no PND, no orthopnea, no palpitations, no arrhythmia, no atrial fibrillation, no LE edema, no dizzy spells, no syncope  Respiratory:  Mild exertional shortness of breath, no home oxygen, no productive cough, no dry cough, no bronchitis, no wheezing, no hemoptysis, no asthma, no pain with inspiration or cough, no sleep apnea, no CPAP at night  GI:   no difficulty swallowing, no reflux, no frequent heartburn, no hiatal hernia, no abdominal pain, no constipation, no diarrhea, no hematochezia, no hematemesis, no melena  GU:   no dysuria,  + frequency, no urinary tract infection,  no hematuria, + enlarged prostate, no kidney stones, no kidney disease  Vascular:  no pain suggestive of claudication, no pain in feet, no leg cramps, no varicose veins, no DVT, no non-healing foot ulcer  Neuro:   no stroke, no TIA's, no seizures, no headaches, no temporary blindness one eye,  no slurred speech, no peripheral neuropathy, no chronic pain, no instability of gait, no memory/cognitive dysfunction  Musculoskeletal: no arthritis, no joint swelling, no myalgias, no difficulty walking, normal mobility   Skin:   no rash, no itching, no skin infections, no pressure sores or ulcerations  Psych:   + anxiety, no depression, no nervousness, no unusual recent stress  Eyes:   no blurry vision, + floaters, no recent vision changes, + wears glasses or contacts  ENT:   no hearing loss, no loose or painful teeth, + dentures  Hematologic:  no easy bruising, no abnormal bleeding, no clotting disorder, no frequent  epistaxis  Endocrine:  no diabetes, does not check CBG's at home           Physical Exam:   BP 116/61   Pulse 96   Resp 20   Ht 5' 6.75" (1.695 m)   Wt 136 lb (61.7 kg)   SpO2 98% Comment: RA  BMI 21.46 kg/m   General:    well-appearing  HEENT:  Unremarkable   Neck:   no JVD, no bruits, no adenopathy   Chest:   clear to auscultation, symmetrical breath sounds, no wheezes, no rhonchi, well-healed sternotomy scar  CV:   RRR, grade IV/VI crescendo/decrescendo murmur heard best at sternal border,  no diastolic murmur  Abdomen:  soft, non-tender, no masses   Extremities:  warm, well-perfused, pulses diminished but palpable, no LE edema  Rectal/GU  Deferred  Neuro:   Grossly non-focal and symmetrical throughout  Skin:   Clean and dry, no rashes, no breakdown   Diagnostic Tests:  Transthoracic Echocardiography  Patient:    Ramez, Arrona MR #:       [id number] Study Date: 11/07/2016 Gender:     M Age:        75 Height:     169.5 cm Weight:     61.2 kg BSA:        1.7 m^2 Pt. Status: Room:   ATTENDING    Sherren Mocha, MD  ORDERING     Sherren Mocha, MD  REFERRING    Sherren Mocha, MD  SONOGRAPHER  Cindy Hazy, RDCS  PERFORMING   [facility], Outpatient  cc:  ------------------------------------------------------------------- LV EF: 65% -   70%  ------------------------------------------------------------------- Indications:      I35.9 Aortic Valve Disorder.  ------------------------------------------------------------------- History:   PMH:  Acquired from the patient and from the patient&'s chart.  PMH:  CAD. Peripheral Vascular Disease.  Risk factors: Hypertension. Dyslipidemia.  ------------------------------------------------------------------- Study Conclusions  - Left ventricle: The cavity size was normal. There was severe   concentric hypertrophy. Systolic function was vigorous. The   estimated ejection fraction was in the range of 65% to  70%. Wall   motion was normal; there were no regional wall motion   abnormalities. Doppler parameters are consistent with abnormal   left ventricular relaxation (grade 1 diastolic dysfunction).   Doppler parameters are consistent with elevated ventricular   end-diastolic filling pressure. - Aortic valve: Trileaflet; severely thickened, severely calcified   leaflets. Valve mobility was restricted. There was critical   stenosis. Mean gradient (S): 75 mm Hg. Peak gradient (S): 131 mm   Hg. -  Aortic root: The aortic root was normal in size. - Mitral valve: There was mild regurgitation. - Left atrium: The atrium was mildly dilated. - Right ventricle: The cavity size was normal. Wall thickness was   normal. Systolic function was normal. - Right atrium: The atrium was normal in size. - Tricuspid valve: There was mild regurgitation. - Pulmonic valve: There was trivial regurgitation. - Pulmonary arteries: Systolic pressure was within the normal   range. - Inferior vena cava: The vessel was normal in size. - Pericardium, extracardiac: There was no pericardial effusion.  Impressions:  - Critical aortic stenosis, mean aortic stenosis 75 mmHg.  ------------------------------------------------------------------- Labs, prior tests, procedures, and surgery: Coronary artery bypass grafting.  ------------------------------------------------------------------- Study data:   Study status:  Routine.  Procedure:  The patient reported no pain pre or post test. Transthoracic echocardiography for left ventricular function evaluation, for right ventricular function evaluation, and for assessment of valvular function. Image quality was adequate.  Study completion:  There were no complications.          Transthoracic echocardiography.  M-mode, complete 2D, spectral Doppler, and color Doppler.  Birthdate: Patient birthdate: 1941-05-18.  Age:  Patient is 76 yr old.  Sex: Gender: male.    BMI: 21.3  kg/m^2.  Blood pressure:     110/50 Patient status:  Outpatient.  Study date:  Study date: 11/07/2016. Study time: 12:47 PM.  Location:  [facility]  -------------------------------------------------------------------  ------------------------------------------------------------------- Left ventricle:  The cavity size was normal. There was severe concentric hypertrophy. Systolic function was vigorous. The estimated ejection fraction was in the range of 65% to 70%. Wall motion was normal; there were no regional wall motion abnormalities. Doppler parameters are consistent with abnormal left ventricular relaxation (grade 1 diastolic dysfunction). Doppler parameters are consistent with elevated ventricular end-diastolic filling pressure.  ------------------------------------------------------------------- Aortic valve:   Trileaflet; severely thickened, severely calcified leaflets. Valve mobility was restricted.  Doppler:   There was critical stenosis.   There was no regurgitation.    VTI ratio of LVOT to aortic valve: 0.37. Valve area (VTI): 1.16 cm^2. Indexed valve area (VTI): 0.68 cm^2/m^2. Peak velocity ratio of LVOT to aortic valve: 0.3. Valve area (Vmax): 0.95 cm^2. Indexed valve area (Vmax): 0.56 cm^2/m^2. Mean velocity ratio of LVOT to aortic valve: 0.35. Valve area (Vmean): 1.1 cm^2. Indexed valve area (Vmean): 0.65 cm^2/m^2.    Mean gradient (S): 75 mm Hg. Peak gradient (S): 131 mm Hg.  ------------------------------------------------------------------- Aorta:  Aortic root: The aortic root was normal in size.  ------------------------------------------------------------------- Mitral valve:   Structurally normal valve.   Mobility was not restricted.  Doppler:  Transvalvular velocity was within the normal range. There was no evidence for stenosis. There was mild regurgitation.    Peak gradient (D): 4 mm  Hg.  ------------------------------------------------------------------- Left atrium:  The atrium was mildly dilated.  ------------------------------------------------------------------- Right ventricle:  The cavity size was normal. Wall thickness was normal. Systolic function was normal.  ------------------------------------------------------------------- Pulmonic valve:    Structurally normal valve.   Cusp separation was normal.  Doppler:  Transvalvular velocity was within the normal range. There was no evidence for stenosis. There was trivial regurgitation.  ------------------------------------------------------------------- Tricuspid valve:   Structurally normal valve.    Doppler: Transvalvular velocity was within the normal range. There was mild regurgitation.  ------------------------------------------------------------------- Pulmonary artery:   The main pulmonary artery was normal-sized. Systolic pressure was within the normal range.  ------------------------------------------------------------------- Right atrium:  The atrium was normal in size.  ------------------------------------------------------------------- Pericardium:  There  was no pericardial effusion.  ------------------------------------------------------------------- Systemic veins: Inferior vena cava: The vessel was normal in size.  ------------------------------------------------------------------- Measurements   Left ventricle                            Value          Reference  LV ID, ED, PLAX chordal           (L)     31.9  mm       43 - 52  LV ID, ES, PLAX chordal                   23.8  mm       23 - 38  LV fx shortening, PLAX chordal    (L)     25    %        >=29  LV PW thickness, ED                       15.6  mm       ---------  IVS/LV PW ratio, ED                       0.99           <=1.3  Stroke volume, 2D                         82    ml       ---------  Stroke volume/bsa, 2D                      48    ml/m^2   ---------  LV e&', lateral                            6.52  cm/s     ---------  LV E/e&', lateral                          14.95          ---------  LV e&', medial                             7.02  cm/s     ---------  LV E/e&', medial                           13.89          ---------  LV e&', average                            6.77  cm/s     ---------  LV E/e&', average                          14.4           ---------    Ventricular septum                        Value          Reference  IVS thickness, ED  15.4  mm       ---------    LVOT                                      Value          Reference  LVOT ID, S                                20    mm       ---------  LVOT area                                 3.14  cm^2     ---------  LVOT ID                                   20    mm       ---------  LVOT peak velocity, S                     115   cm/s     ---------  LVOT mean velocity, S                     94.9  cm/s     ---------  LVOT VTI, S                               26.2  cm       ---------  LVOT peak gradient, S                     5     mm Hg    ---------  Stroke volume (SV), LVOT DP               82.3  ml       ---------  Stroke index (SV/bsa), LVOT DP            48.5  ml/m^2   ---------    Aortic valve                              Value          Reference  Aortic valve peak velocity, S             380   cm/s     ---------  Aortic valve mean velocity, S             270   cm/s     ---------  Aortic valve VTI, S                       70.9  cm       ---------  Aortic mean gradient, S                   33    mm Hg    ---------  Aortic peak gradient, S                   58    mm Hg    ---------  VTI ratio, LVOT/AV                        0.37           ---------  Aortic valve area, VTI                    1.16  cm^2     ---------  Aortic valve area/bsa, VTI                0.68  cm^2/m^2 ---------  Velocity ratio, peak,  LVOT/AV             0.3            ---------  Aortic valve area, peak velocity          0.95  cm^2     ---------  Aortic valve area/bsa, peak               0.56  cm^2/m^2 ---------  velocity  Velocity ratio, mean, LVOT/AV             0.35           ---------  Aortic valve area, mean velocity          1.1   cm^2     ---------  Aortic valve area/bsa, mean               0.65  cm^2/m^2 ---------  velocity    Aorta                                     Value          Reference  Aortic root ID, ED                        34    mm       ---------    Left atrium                               Value          Reference  LA ID, A-P, ES                            41    mm       ---------  LA ID/bsa, A-P                    (H)     2.42  cm/m^2   <=2.2  LA volume, S                              54    ml       ---------  LA volume/bsa, S                          31.8  ml/m^2   ---------  LA volume, ES, 1-p A4C                    38    ml       ---------  LA volume/bsa, ES, 1-p A4C  22.4  ml/m^2   ---------  LA volume, ES, 1-p A2C                    64    ml       ---------  LA volume/bsa, ES, 1-p A2C                37.7  ml/m^2   ---------    Mitral valve                              Value          Reference  Mitral E-wave peak velocity               97.5  cm/s     ---------  Mitral A-wave peak velocity               155   cm/s     ---------  Mitral deceleration time          (H)     324   ms       150 - 230  Mitral peak gradient, D                   4     mm Hg    ---------  Mitral E/A ratio, peak                    0.6            ---------  Mitral maximal regurg velocity,           693   cm/s     ---------  PISA    Tricuspid valve                           Value          Reference  Tricuspid regurg peak velocity            207   cm/s     ---------  Tricuspid peak RV-RA gradient             17    mm Hg    ---------    Right ventricle                           Value          Reference   RV s&', lateral, S                         15.8  cm/s     ---------  Legend: (L)  and  (H)  mark values outside specified reference range.  ------------------------------------------------------------------- Prepared and Electronically Authenticated by  Ena Dawley, M.D. 2018-04-03T13:42:05   Right Heart Cath and Coronary/Graft Angiography  Conclusion   1. Known severe aortic stenosis 2. Severe 3 vessel CAD with severe diffuse proximal LAD stenosis, total occlusion of a severely calcified RCA, and severe LCx stenosis 3. S/P CABG with continued patency of all 5 bypass grafts 4. Severe calcified aortic valve with restricted opening 5. Severe calcified ascending aorta by fluoroscopy  Recommend: Continued TAVR evaluation for treatment of severe symptomatic aortic stenosis  Indications   Severe aortic stenosis [I35.0 (ICD-10-CM)]  Procedural Details/Technique   Technical Details INDICATION: Severe aortic stenosis - pre-TAVR evaluation  PROCEDURAL  DETAILS: There was an indwelling IV in a right antecubital vein. Using normal sterile technique, the IV was changed out for a 5 Fr brachial sheath over a 0.018 inch wire. The left wrist was then prepped, draped, and anesthetized with 1% lidocaine. Using the modified Seldinger technique a 5/6 French Slender sheath was placed in the left radial artery. Intra-arterial verapamil was administered through the radial artery sheath. IV heparin was administered after a JR4 catheter was advanced into the central aorta. A Swan-Ganz catheter was used for the right heart catheterization. Standard protocol was followed for recording of right heart pressures and sampling of oxygen saturations. Fick cardiac output was calculated. Standard Judkins catheters were used for selective coronary angiography, LIMA angiography, and SVG angiography. Aortic root angiography is performed with a pigtail catheter. There were no immediate procedural complications. The  patient was transferred to the post catheterization recovery area for further monitoring.    Estimated blood loss <50 mL.  During this procedure the patient was administered the following to achieve and maintain moderate conscious sedation: Versed mg, Fentanyl mcg, while the patient's heart rate, blood pressure, and oxygen saturation were continuously monitored.    Coronary Findings   Dominance: Right  Left Anterior Descending  Prox LAD to Mid LAD lesion, 75% stenosed. The lesion is calcified. diffusely diseased LAD  First Diagonal Branch  Ost 1st Diag to 1st Diag lesion, 100% stenosed.  Left Circumflex  Prox Cx lesion, 95% stenosed. The lesion is calcified.  Right Coronary Artery  Ost RCA to Prox RCA lesion, 100% stenosed. The lesion is severely calcified.  Graft Angiography  saphenous Graft to RPDA  SVG and is normal in caliber and anatomically normal. Widely patent SVG-PDA  Free LIMA Graft to Dist LAD  LIMA graft was visualized by angiography and is normal in caliber and anatomically normal. The LAD has mild diffuse plaquing beyond the LIMA insertion. There are no areas of high-grade obstruction.  Sequential jump graft Graft to 2nd Mrg, 3rd Mrg  Seq SVG- OM3 and OM4. patent sequential graft to the distal OM branches  saphenous Graft to 1st Diag  SVG and is normal in caliber and anatomically normal.  Coronary Diagrams   Diagnostic Diagram       Implants     No implant documentation for this case.  PACS Images   Show images for Cardiac catheterization   Link to Procedure Log   Procedure Log    Hemo Data    Most Recent Value  Fick Cardiac Output 6.57 L/min  Fick Cardiac Output Index 3.82 (L/min)/BSA  RA A Wave 2 mmHg  RA V Wave 0 mmHg  RA Mean 0 mmHg  RV Systolic Pressure 29 mmHg  RV Diastolic Pressure -2 mmHg  RV EDP 3 mmHg  PA Systolic Pressure 26 mmHg  PA Diastolic Pressure 4 mmHg  PA Mean 14 mmHg  PW A Wave 9 mmHg  PW V Wave 5 mmHg  PW Mean 4 mmHg  AO  Systolic Pressure 465 mmHg  AO Diastolic Pressure 72 mmHg  AO Mean 112 mmHg  QP/QS 1  TPVR Index 3.66 HRUI  TSVR Index 29.32 HRUI  TPVR/TSVR Ratio 0.13    Cardiac TAVR CT  TECHNIQUE: The patient was scanned on a Philips 256 scanner. A 120 kV retrospective scan was triggered in the descending thoracic aorta at 111 HU's. Gantry rotation speed was 270 msecs and collimation was .9 mm. 10 mg of iv metoprolol and no nitro were given. The 3D data set  was reconstructed in 5% intervals of the R-R cycle. Systolic and diastolic phases were analyzed on a dedicated work station using MPR, MIP and VRT modes. The patient received 80 cc of contrast.  FINDINGS: Aortic Valve: Trileaflet, moderately calcified and thickened with severely restricted leaflet opening. Asymmetric calcification of the non-coronary leaflet. No calcifications extending into the LVOT.  Aorta: Normal caliber. Moderate to severe diffuse calcifications and atherosclerosis throughout the thoracic aorta. No dissection.  Sinotubular Junction:  28 x 27 mm  Ascending Thoracic Aorta:  30 x 30 mm  Aortic Arch:  25 x 23 mm  Descending Thoracic Aorta:  23 x 21 mm  Sinus of Valsalva Measurements:  Non-coronary:  33 mm  Right -coronary:  32 mm  Left -coronary:  34 mm  Coronary Artery Height above Annulus:  Left Main:  14 mm  Right Coronary:  16 mm  Virtual Basal Annulus Measurements:  Maximum/Minimum Diameter:  26 x 21 mm  Perimeter:  75 mm  Area:  426 mm  Optimum Fluoroscopic Angle for Delivery:  LAO 3 CAU 5  IMPRESSION: 1. Trileaflet, moderately calcified and thickened aortic valve with severely restricted leaflet opening. There is asymmetric calcification of the non-coronary leaflet and no calcifications extending into the LVOT. Annular measurements suitable for delivery of a 26 mm Edwards-SAPIEN 3 valve given diameter sizes.  2.  Sufficient annulus to coronary distance.  3.  Optimum Fluoroscopic Angle for Delivery:  LAO 3 CAU 5  Ena Dawley   Electronically Signed   By: Ena Dawley   On: 11/27/2016 14:40   CT ANGIOGRAPHY CHEST, ABDOMEN AND PELVIS  TECHNIQUE: Multidetector CT imaging through the chest, abdomen and pelvis was performed using the standard protocol during bolus administration of intravenous contrast. Multiplanar reconstructed images and MIPs were obtained and reviewed to evaluate the vascular anatomy.  CONTRAST:  150 mL of Isovue 370.  COMPARISON:  Chest CT 04/06/2016.  FINDINGS: CTA CHEST FINDINGS  Cardiovascular: Heart size is normal. There is no significant pericardial fluid, thickening or pericardial calcification. There is aortic atherosclerosis, as well as atherosclerosis of the great vessels of the mediastinum and the coronary arteries, including calcified atherosclerotic plaque in the left main, left anterior descending, left circumflex and right coronary arteries. Status post median sternotomy for CABG including LIMA to the LAD. Severe thickening calcification of the aortic valve. Calcification of the mitral annulus.  Mediastinum/Lymph Nodes: No pathologically enlarged mediastinal or hilar lymph nodes. Esophagus is unremarkable in appearance. No axillary lymphadenopathy.  Lungs/Pleura: 5 mm subpleural nodule in the anterior aspect of the right lower lobe abutting the major fissure (image 32 of series 402) is unchanged, compatible with a benign subpleural lymph node. No other larger more suspicious appearing pulmonary nodules or masses are noted. No acute consolidative airspace disease. No pleural effusions. Mild diffuse bronchial wall thickening with mild centrilobular and paraseptal emphysema.  Musculoskeletal/Soft Tissues: Status post median sternotomy. There are no aggressive appearing lytic or blastic lesions noted in the visualized portions of the skeleton.  CTA ABDOMEN AND PELVIS  FINDINGS  Hepatobiliary: No cystic or solid hepatic lesions. No intra or extrahepatic biliary ductal dilatation. Gallbladder is normal in appearance.  Pancreas: The pancreatic mass. No pancreatic ductal dilatation. No pancreatic or peripancreatic fluid or inflammatory changes.  Spleen: Unremarkable.  Adrenals/Urinary Tract: Large nonobstructive calculus in the upper pole collecting system of left kidney measuring up to 17 mm in diameter. Exophytic simple cyst in the upper pole of the right kidney measuring 5.6 cm in diameter. Other  subcentimeter low-attenuation lesions in both kidneys are too small to definitively characterize, but are statistically likely to represent cysts. No hydroureteronephrosis. Urinary bladder is unremarkable in appearance.  Stomach/Bowel: The appearance of the stomach is normal. There is no pathologic dilatation of small bowel or colon. Numerous colonic diverticulae are noted, particularly in the sigmoid colon and descending colon, without surrounding inflammatory changes to suggest an acute diverticulitis at this time. The appendix is not confidently identified and may be surgically absent. Regardless, there are no inflammatory changes noted adjacent to the cecum to suggest the presence of an acute appendicitis at this time.  Vascular/Lymphatic: Vascular findings and measurements pertinent to potential TAVR procedure, as detailed below. 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 Throughout this area affected by the dissection, there is also fusiform ectasia of the infrarenal abdominal aorta measuring up to 2.5 cm in diameter. Celiac axis, superior mesenteric artery and inferior mesenteric artery are all widely patent without hemodynamically significant stenosis. Single renal arteries bilaterally are widely patent.  No lymphadenopathy noted in the abdomen or pelvis.  Reproductive: Prostate gland is mildly enlarged with median lobe hypertrophy. Seminal vesicles are unremarkable in appearance.  Other: No significant volume of ascites.  No pneumoperitoneum.  Musculoskeletal: There are no aggressive appearing lytic or blastic lesions noted in the visualized portions of the skeleton.  VASCULAR MEASUREMENTS PERTINENT TO TAVR:  AORTA:  Minimal Aortic Diameter -  8 x 9 mm  Severity of Aortic Calcification -  severe  RIGHT PELVIS:  Right Common Iliac Artery -  Minimal Diameter - 6.3 x 4.1 mm  Tortuosity - mild  Calcification - severe  Right External Iliac Artery -  Minimal Diameter - 5.4 x 4.8 mm  Tortuosity - mild  Calcification - mild to moderate  Right Common Femoral Artery -  Minimal Diameter - 4.4 x 4.2 mm  Tortuosity - mild  Calcification - moderate  LEFT PELVIS:  Left Common Iliac Artery -  Minimal Diameter - 3.2 x 3.5 mm  Tortuosity - mild  Calcification - severe  Left External Iliac Artery -  Minimal Diameter - 5.1 x 3.2 mm  Tortuosity - mild  Calcification - mild to moderate  Left Common Femoral Artery -  Minimal Diameter - 5.4 x 3.9 mm  Tortuosity - mild  Calcification - moderate  Review of the MIP images confirms the above findings.  IMPRESSION: 1. Vascular findings and measurements pertinent to potential TAVR procedure, as detailed above. This patient does not appear to have suitable pelvic arterial access on either side. 2. Severe thickening calcification of the aortic valve, compatible with the reported clinical history of aortic stenosis. 3. Aortic atherosclerosis, in addition to left main and 3 vessel coronary artery disease. Status post median sternotomy for CABG including LIMA to the LAD. 4. 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 5. Large 17 mm nonobstructive calculus in the upper pole collecting system of left kidney. 6. Colonic diverticulosis without evidence of acute diverticulitis at this time. 7. Mild diffuse bronchial wall thickening with mild centrilobular and paraseptal emphysema; imaging findings suggestive of underlying COPD. 8. Additional incidental findings, as above.   Electronically Signed   By: Vinnie Langton M.D.   On: 11/28/2016 15:58   Impression:  Patient has stage  D severe symptomatic aortic stenosis. He presents with recent onset of symptoms of progressive fatigue, weight loss, and mild exertional shortness of breath consistent with chronic diastolic congestive heart failure, [place] Heart Association functional class II. 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 I have personally reviewed the patient's recent echocardiogram, diagnostic cardiac catheterization and CT angiogram.  Echocardiogram confirms the presence of severe aortic stenosis with severe thickening, calcification, and restricted leaflet mobility involving all 3 leaflets of the patient's aortic valve. Peak velocity across the aortic valve ranges between 4 and 5 m/s corresponding to mean transvalvular gradient estimated as high as 75 mmHg. Left ventricular systolic function remains normal. Diagnostic cardiac catheterization demonstrates presence of severe native coronary artery disease but continued patency of all 5 bypass grafts placed in 2006 with no significant vein graft disease. Risks associated with conventional surgical aortic valve replacement would be elevated because of the patient's age and previous bypass surgery. In addition, CT angiography demonstrates severe calcification of the ascending thoracic aorta which would further increase risks associated  with conventional surgery considerably. Cardiac gated CT angiogram of the heart reveals findings consistent with aortic stenosis suitable for transcatheter aortic valve replacement without any complicating features.  Unfortunately, CT image gram of the aorta and iliac vessels demonstrates severe atherosclerotic disease with what appears to be a limited short segment dissection and infrarenal abdominal aorta. Although vessel lumen diameters appear adequate, the patient may not have adequate pelvic vascular access for transfemoral approach.   Plan:  The patient and his wife were counseled at length regarding treatment alternatives for management of severe symptomatic aortic stenosis. Alternative approaches such as conventional aortic valve replacement, transcatheter aortic valve replacement, and palliative medical therapy were compared and contrasted at length.  The risks associated with conventional surgical aortic valve replacement were been discussed in detail, as were reasons why I feel that conventional surgery would be high risk.  Transcatheter aortic valve replacement was discussed as an alternative including the possibility that the patient may require alternative access. Long-term prognosis with medical therapy was discussed. This discussion was placed in the context of the patient's own specific clinical presentation and past medical history.  All of their questions been addressed.  The patient hopes to proceed with transcatheter aortic valve replacement in the near future and has previously been scheduled for surgery on 12/05/2016. The patient's case will be reviewed by a multidisciplinary team at specialists to discuss plans for vascular access.  Following the decision to proceed with transcatheter aortic valve replacement, a discussion has been held regarding what types of management strategies would be attempted intraoperatively in the event of life-threatening complications, including whether or  not the patient would be considered a candidate for the use of cardiopulmonary bypass and/or conversion to open sternotomy for attempted surgical intervention.  The patient has been advised of a variety of complications that might develop including but not limited to risks of death, stroke, paravalvular leak, aortic dissection or other major vascular complications, aortic annulus rupture, device embolization, cardiac rupture or perforation, mitral regurgitation, acute myocardial infarction, arrhythmia, heart block or bradycardia requiring permanent pacemaker placement, congestive heart failure, respiratory failure, renal failure, pneumonia, infection, other late complications related to structural valve deterioration or migration, or other complications that might ultimately cause a temporary or permanent loss of functional independence or other long term morbidity.  The patient provides full informed consent for the procedure as described and all questions  were answered.   I spent in excess of 90 minutes during the conduct of this office consultation and >50% of this time involved direct face-to-face encounter with the patient for counseling and/or coordination of their care.     Valentina Gu. [name], MD 11/28/2016

## 2016-11-28 NOTE — Progress Notes (Signed)
   Brett [name]. [name], MD 11/28/2016 1:58 PM

## 2016-11-28 NOTE — Patient Instructions (Signed)
Stop taking Pletal  Continue taking all other medications without change through the day before surgery.  Have nothing to eat or drink after midnight the night before surgery.  On the morning of surgery do not take any medications

## 2016-11-29 ENCOUNTER — Other Ambulatory Visit: Payer: Self-pay | Admitting: *Deleted

## 2016-11-29 DIAGNOSIS — I350 Nonrheumatic aortic (valve) stenosis: Secondary | ICD-10-CM

## 2016-11-30 NOTE — Pre-Procedure Instructions (Signed)
    Brett [name]  11/30/2016      [facility] [place] [address] [place] [address] Phone: [phone] Fax: [phone]  [id number] - [place], [place] [address] [place] [address] Phone: [phone] Fax: [phone]    Your procedure is scheduled on Tuesday, May 1st.   [facility]:30 AM.             (posted surgery time 7:30 - 9:30am)   Call this number if you have problems the morning of surgery:  [phone], other questions, call [phone] Mon - Fri  from 8-4:30pm   Remember:  Do not eat food or drink liquids after midnight Monday.   Take these medicines the morning of surgery with A SIP OF WATER :               4-5 days prior to surgery, STOP taking any Vitamins, Herbal Supplements, Anti-inflammatories.   Do not wear jewelry - no rings or watches.  Do not wear lotions, colognes or deoderant.             Men may shave face and neck.   Do not bring valuables to the hospital.  [facility] is not responsible for any belongings or valuables.  Contacts, dentures or bridgework may not be worn into surgery.  Leave your suitcase in the car.  After surgery it may be brought to your room. For patients admitted to the hospital, discharge time will be determined by your treatment team.  Please read over the following fact sheets that you were given. Pain Booklet, MRSA Information and Surgical Site Infection Prevention

## 2016-12-01 ENCOUNTER — Encounter (HOSPITAL_COMMUNITY)
Admission: RE | Admit: 2016-12-01 | Discharge: 2016-12-01 | Disposition: A | Discharge status: Home / Self Care | Payer: Medicare Other | Source: Ambulatory Visit | Attending: Cardiovascular Disease | Admitting: Cardiovascular Disease

## 2016-12-01 ENCOUNTER — Ambulatory Visit (HOSPITAL_COMMUNITY)
Admission: RE | Admit: 2016-12-01 | Discharge: 2016-12-01 | Disposition: A | Discharge status: Home / Self Care | Payer: Medicare Other | Source: Ambulatory Visit | Attending: Cardiovascular Disease | Admitting: Cardiovascular Disease

## 2016-12-01 ENCOUNTER — Encounter (HOSPITAL_COMMUNITY): Payer: Self-pay

## 2016-12-01 DIAGNOSIS — I700 Atherosclerosis of aorta: Secondary | ICD-10-CM | POA: Insufficient documentation

## 2016-12-01 DIAGNOSIS — Z01812 Encounter for preprocedural laboratory examination: Secondary | ICD-10-CM | POA: Diagnosis not present

## 2016-12-01 DIAGNOSIS — Z951 Presence of aortocoronary bypass graft: Secondary | ICD-10-CM | POA: Insufficient documentation

## 2016-12-01 DIAGNOSIS — Z01818 Encounter for other preprocedural examination: Secondary | ICD-10-CM | POA: Insufficient documentation

## 2016-12-01 DIAGNOSIS — I739 Peripheral vascular disease, unspecified: Secondary | ICD-10-CM | POA: Diagnosis not present

## 2016-12-01 DIAGNOSIS — E785 Hyperlipidemia, unspecified: Secondary | ICD-10-CM | POA: Insufficient documentation

## 2016-12-01 DIAGNOSIS — R972 Elevated prostate specific antigen [PSA]: Secondary | ICD-10-CM | POA: Diagnosis not present

## 2016-12-01 DIAGNOSIS — I350 Nonrheumatic aortic (valve) stenosis: Secondary | ICD-10-CM | POA: Diagnosis not present

## 2016-12-01 DIAGNOSIS — I6523 Occlusion and stenosis of bilateral carotid arteries: Secondary | ICD-10-CM | POA: Diagnosis not present

## 2016-12-01 DIAGNOSIS — R739 Hyperglycemia, unspecified: Secondary | ICD-10-CM | POA: Diagnosis not present

## 2016-12-01 DIAGNOSIS — I10 Essential (primary) hypertension: Secondary | ICD-10-CM | POA: Insufficient documentation

## 2016-12-01 LAB — URINALYSIS, ROUTINE W REFLEX MICROSCOPIC
BILIRUBIN URINE: NEGATIVE
GLUCOSE, UA: NEGATIVE mg/dL
Ketones, ur: 5 mg/dL — AB
NITRITE: NEGATIVE
PH: 5 (ref 5.0–8.0)
Protein, ur: 30 mg/dL — AB
SPECIFIC GRAVITY, URINE: 1.031 — AB (ref 1.005–1.030)

## 2016-12-01 LAB — CBC
HCT: 38.6 % — ABNORMAL LOW (ref 39.0–52.0)
Hemoglobin: 13.3 g/dL (ref 13.0–17.0)
MCH: 33.6 pg (ref 26.0–34.0)
MCHC: 34.5 g/dL (ref 30.0–36.0)
MCV: 97.5 fL (ref 78.0–100.0)
PLATELETS: 195 10*3/uL (ref 150–400)
RBC: 3.96 MIL/uL — ABNORMAL LOW (ref 4.22–5.81)
RDW: 13.4 % (ref 11.5–15.5)
WBC: 9.3 10*3/uL (ref 4.0–10.5)

## 2016-12-01 LAB — PROTIME-INR
INR: 1.06
Prothrombin Time: 13.9 seconds (ref 11.4–15.2)

## 2016-12-01 LAB — COMPREHENSIVE METABOLIC PANEL
ALBUMIN: 4.3 g/dL (ref 3.5–5.0)
ALT: 17 U/L (ref 17–63)
AST: 23 U/L (ref 15–41)
Alkaline Phosphatase: 57 U/L (ref 38–126)
Anion gap: 9 (ref 5–15)
BUN: 17 mg/dL (ref 6–20)
CHLORIDE: 108 mmol/L (ref 101–111)
CO2: 21 mmol/L — AB (ref 22–32)
Calcium: 9.5 mg/dL (ref 8.9–10.3)
Creatinine, Ser: 0.96 mg/dL (ref 0.61–1.24)
GFR calc Af Amer: >60 mL/min (ref >60)
GFR calc non Af Amer: >60 mL/min (ref >60)
GLUCOSE: 98 mg/dL (ref 65–99)
POTASSIUM: 4 mmol/L (ref 3.5–5.1)
Sodium: 138 mmol/L (ref 135–145)
Total Bilirubin: 0.7 mg/dL (ref 0.3–1.2)
Total Protein: 6.9 g/dL (ref 6.5–8.1)

## 2016-12-01 LAB — BLOOD GAS, ARTERIAL
ACID-BASE EXCESS: 0.2 mmol/L (ref 0.0–2.0)
Bicarbonate: 23.6 mmol/L (ref 20.0–28.0)
DRAWN BY: 449841
FIO2: 21
O2 SAT: 96.8 %
PH ART: 7.457 — AB (ref 7.350–7.450)
Patient temperature: 98.6
pCO2 arterial: 33.9 mmHg (ref 32.0–48.0)
pO2, Arterial: 80.8 mmHg — ABNORMAL LOW (ref 83.0–108.0)

## 2016-12-01 LAB — SURGICAL PCR SCREEN
MRSA, PCR: NEGATIVE
Staphylococcus aureus: NEGATIVE

## 2016-12-01 LAB — ABO/RH: ABO/RH(D): A POS

## 2016-12-01 LAB — APTT: aPTT: 35 seconds (ref 24–36)

## 2016-12-01 NOTE — Progress Notes (Addendum)
PCP is Dr. Garret Reddish  LOV 2017 Cardio is Dr. Ezzie Dural  LOV 11/2016 Denies any cardiac issues, or problems.  Tests results in epic He has been told to stop his Pletal  -- LD 11/21/2016 Left message for Thurmond Butts to check UA results

## 2016-12-02 LAB — HEMOGLOBIN A1C
HEMOGLOBIN A1C: 5.6 % (ref 4.8–5.6)
Mean Plasma Glucose: 114 mg/dL

## 2016-12-04 ENCOUNTER — Encounter: Payer: Self-pay | Admitting: Surgery

## 2016-12-04 ENCOUNTER — Institutional Professional Consult (permissible substitution) (INDEPENDENT_AMBULATORY_CARE_PROVIDER_SITE_OTHER): Payer: Medicare Other | Admitting: Surgery

## 2016-12-04 ENCOUNTER — Other Ambulatory Visit: Payer: Self-pay

## 2016-12-04 ENCOUNTER — Other Ambulatory Visit: Payer: Self-pay | Admitting: *Deleted

## 2016-12-04 VITALS — BP 132/67 | HR 75 | Resp 16 | Ht 66.75 in | Wt 136.0 lb

## 2016-12-04 DIAGNOSIS — I350 Nonrheumatic aortic (valve) stenosis: Secondary | ICD-10-CM

## 2016-12-04 DIAGNOSIS — Z951 Presence of aortocoronary bypass graft: Secondary | ICD-10-CM | POA: Diagnosis not present

## 2016-12-04 DIAGNOSIS — N390 Urinary tract infection, site not specified: Secondary | ICD-10-CM

## 2016-12-04 DIAGNOSIS — I6523 Occlusion and stenosis of bilateral carotid arteries: Secondary | ICD-10-CM | POA: Diagnosis not present

## 2016-12-04 MED ORDER — DEXMEDETOMIDINE HCL IN NACL 400 MCG/100ML IV SOLN
0.1000 ug/kg/h | INTRAVENOUS | Status: DC
  Filled 2016-12-04: qty 100

## 2016-12-04 MED ORDER — NITROGLYCERIN IN D5W 200-5 MCG/ML-% IV SOLN
2.0000 ug/min | INTRAVENOUS | Status: DC
Start: 2016-12-05 — End: 2016-12-05
  Filled 2016-12-04: qty 250

## 2016-12-04 MED ORDER — EPINEPHRINE PF 1 MG/ML IJ SOLN
0.0000 ug/min | INTRAMUSCULAR | Status: DC
  Filled 2016-12-04: qty 4

## 2016-12-04 MED ORDER — POTASSIUM CHLORIDE 2 MEQ/ML IV SOLN
80.0000 meq | INTRAVENOUS | Status: DC
  Filled 2016-12-04: qty 40

## 2016-12-04 MED ORDER — HEPARIN SODIUM (PORCINE) 1000 UNIT/ML IJ SOLN
INTRAMUSCULAR | Status: DC
  Filled 2016-12-04: qty 30

## 2016-12-04 MED ORDER — MAGNESIUM SULFATE 50 % IJ SOLN
40.0000 meq | INTRAMUSCULAR | Status: DC
  Filled 2016-12-04: qty 10

## 2016-12-04 MED ORDER — SODIUM CHLORIDE 0.9 % IV SOLN
INTRAVENOUS | Status: DC
  Filled 2016-12-04: qty 2.5

## 2016-12-04 MED ORDER — DOPAMINE-DEXTROSE 3.2-5 MG/ML-% IV SOLN
0.0000 ug/kg/min | INTRAVENOUS | Status: DC
  Filled 2016-12-04: qty 250

## 2016-12-04 MED ORDER — SODIUM CHLORIDE 0.9 % IV SOLN
30.0000 ug/min | INTRAVENOUS | Status: DC
  Filled 2016-12-04: qty 2

## 2016-12-04 MED ORDER — VANCOMYCIN HCL 10 G IV SOLR
1250.0000 mg | INTRAVENOUS | Status: AC
  Administered 2016-12-05: 1250 mg via INTRAVENOUS
  Filled 2016-12-04 (×2): qty 1250

## 2016-12-04 MED ORDER — SODIUM CHLORIDE 0.9 % IV SOLN: INTRAVENOUS | Status: DC

## 2016-12-04 MED ORDER — NOREPINEPHRINE BITARTRATE 1 MG/ML IV SOLN
0.0000 ug/min | INTRAVENOUS | Status: DC
  Filled 2016-12-04: qty 4

## 2016-12-04 MED ORDER — CIPROFLOXACIN HCL 500 MG PO TABS: 500.0000 mg | ORAL_TABLET | Freq: Two times a day (BID) | ORAL | 0 refills | Status: DC

## 2016-12-04 MED ORDER — DEXTROSE 5 % IV SOLN
1.5000 g | INTRAVENOUS | Status: AC
  Administered 2016-12-05: 1.5 g via INTRAVENOUS
  Filled 2016-12-04: qty 1.5

## 2016-12-04 NOTE — Progress Notes (Addendum)
Anesthesia Chart Review: Patient is a 76 year old male scheduled for TAVR, transfemoral approach, possible transapical approach on 12/05/16 by Dr. Burt Knack.   History includes CAD s/p CABG X 5 (LIMA-LAD, SVG-D2, SVG-OM3-OM4, SVG-PDA) 12/07/04, severe AS, HTN, HLD, former smoker (quit '17), PVD, carotid stenosis (mild), iron deficiency anemia, leukocytosis, hyperglycemia.  PCP is Dr. Garret Reddish. Cardiologist is Dr. Sherren Mocha. Hematologist is Dr. Burney Gauze.  Meds include Uroxatral, Lipitor, Pletal (on hold), finasteride, niacin.  BP (!) 153/73   Pulse 99   Temp 36.4 C   Resp 20   Ht 5' 6.5" (1.689 m)   Wt 138 lb (62.6 kg)   SpO2 100%   BMI 21.94 kg/m   Preoperative labs noted. A1c 5.6. UA is cloudy with large leukocytes, negative nitrites, WBC too numerous to count, few bacteria. Labs marked as reviewed. Voice message regarding UA results also left for [facility] RN Thurmond Butts and report routed via Epic. (Update: Patient is being given Cipro, and Dr. [name] has requested a urine culture be sent on the day of surgery.)  Echo 11/07/16: Study Conclusions - Left ventricle: The cavity size was normal. There was severe   concentric hypertrophy. Systolic function was vigorous. The   estimated ejection fraction was in the range of 65% to 70%. Wall   motion was normal; there were no regional wall motion   abnormalities. Doppler parameters are consistent with abnormal   left ventricular relaxation (grade 1 diastolic dysfunction).   Doppler parameters are consistent with elevated ventricular   end-diastolic filling pressure. - Aortic valve: Trileaflet; severely thickened, severely calcified   leaflets. Valve mobility was restricted. There was critical   stenosis. Mean gradient (S): 75 mm Hg. Peak gradient (S): 131 mm   Hg. - Aortic root: The aortic root was normal in size. - Mitral valve: There was mild regurgitation. - Left atrium: The atrium was mildly dilated. - Right ventricle: The cavity size  was normal. Wall thickness was   normal. Systolic function was normal. - Right atrium: The atrium was normal in size. - Tricuspid valve: There was mild regurgitation. - Pulmonic valve: There was trivial regurgitation. - Pulmonary arteries: Systolic pressure was within the normal   range. - Inferior vena cava: The vessel was normal in size. - Pericardium, extracardiac: There was no pericardial effusion.  Cardiac cath 11/21/16: 1. Known severe aortic stenosis 2. Severe 3 vessel CAD with severe diffuse proximal LAD stenosis, total occlusion of a severely calcified RCA, and severe LCx stenosis 3. S/P CABG with continued patency of all 5 bypass grafts 4. Severe calcified aortic valve with restricted opening 5. Severe calcified ascending aorta by fluoroscopy Recommend: Continued TAVR evaluation for treatment of severe symptomatic aortic stenosis  Carotid U/S 11/07/16: Impression: Heterogeneous plaque, bilaterally. Stable 1-39% right ICA stenosis. Decreased velocities in the left ICA, now in the 1-39% range. Normal right SCA; mildly elevated velocities on the left. Patent vertebral arteries with antegrade flow.  CTA chest/abd/pelvis 11/27/16: IMPRESSION: 1. Vascular findings and measurements pertinent to potential TAVR procedure, as detailed above. This patient does not appear to have suitable pelvic arterial access on either side. 2. Severe thickening calcification of the aortic valve, compatible with the reported clinical history of aortic stenosis. 3. Aortic atherosclerosis, in addition to left main and 3 vessel coronary artery disease. Status post median sternotomy for CABG including LIMA to the LAD. 4. 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 5. Large 17 mm nonobstructive calculus in the upper pole collecting system of left  kidney. 6. Colonic diverticulosis without evidence of acute diverticulitis at this time. 7. Mild diffuse bronchial wall thickening with mild centrilobular and paraseptal emphysema; imaging findings suggestive of underlying COPD. 8. Additional incidental findings, as above.  Preoperative EKG, cardiac CT, chest x-ray, PFTs noted.  If no acute changes then I would anticipate that he can proceed as planned.  George Hugh [facility]/Anesthesiology Phone [phone] 12/04/2016 9:35 AM

## 2016-12-04 NOTE — Progress Notes (Signed)
Patient ID: Brett [name], male   DOB: [birth year], 76 y.o.   MRN: [id number]  [facility] CONSULTATION REPORT  Referring Provider is Sherren Mocha, MD PCP is Garret Reddish, MD  Reason for consultation: critical aortic stenosis  HPI:  The patient is a 76 year old gentleman with a history of coronary artery disease status post coronary artery bypass grafting x 5 in 2006 by Dr. [name], hypertension, hyperlipidemia, tobacco abuse, and aortic stenosis that has been followed with serial echo. His echo in 11/2015 had shown severe AS with a mean gradient of 42 mm Hg and normal LV function. He remained asymptomatic until recently when he began to complain of exertional shortness of breath and fatigue. He had associated iron deficiency anemia and his symptoms did not improve with treatment of his anemia. A repeat echo on 11/07/2016 showed progression of his AS to critical with a mean gradient of 75 mm Hg and normal LVEF. He underwent cardiac cath showing patent bypass grafts with no significant vein graft disease and severe native 3-vessel coronary disease.   He is married and lives with his wife. He works as a Teacher, early years/pre for [employer] but has not been driving since his most recent echo. He has remained active but now notes fatigue and shortness of breath with walking up hills or working around the house.  He denies any chest pain, dizziness or syncope. He denies orthopnea and PND and has had no symptoms at rest or with low level activity although his wife says that he is not as energetic as he was a year ago and sleeps a lot during the day.  Past Medical History:  Diagnosis Date  . Carotid stenosis   . Elevated PSA   . 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 Hyperglycemia   . Hyperlipidemia   . Hyperplastic colon polyp 11/24/2009   x7  . Hypertension   . Iron deficiency anemia due to chronic blood  loss 08/15/2016  . Leukocytosis 08/02/2016  . 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  . PVD (peripheral vascular disease) ([place])   . 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 CORONARY ARTERY BYPASS GRAFT  12/07/2004   CABG x5 Dr [name]  . RIGHT HEART CATH AND CORONARY/GRAFT ANGIOGRAPHY N/A 11/21/2016   Procedure: Right Heart Cath and Coronary/Graft Angiography;  Surgeon: Sherren Mocha, MD;  Location: [facility];  Service: Cardiovascular;  Laterality: N/A;  . TONSILLECTOMY      Family History  Problem Relation Age of Onset  . Heart attack Father   . Dementia Mother   . Colon cancer Neg Hx   . Colon polyps Neg Hx   . Rectal cancer Neg Hx   . Stomach cancer Neg Hx     Social History   Social History  . Marital status: Married    Spouse name: N/A  . Number of children: 3  . Years of education: N/A   Occupational History  . Retired    Social History Main Topics  . Smoking status: Former Smoker    Packs/day: 1.00    Years: 40.00    Types: Cigarettes    Quit date: 01/01/2016  . Smokeless tobacco: Former Systems developer    Quit date: 12/06/2015  . Alcohol use 0.0 oz/week  Comment: occasionaly  . Drug use: No  . Sexual activity: Not on file   Other Topics Concern  . Not on file   Social History Narrative   Married 1988 2nd marriage. 1 child from 2nd marriage (1994). 2 children from first marriage. 3-4 grandkids-broken relationship with his son though.       Retired from [employer] after 36 years in 1999. 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       Hobbies: gardening, formerly bowling and pool, family time    Current Outpatient Prescriptions  Medication Sig Dispense Refill  . acetaminophen (TYLENOL) 500 MG tablet Take 1,000 mg by mouth 3 (three) times daily.    Marland Kitchen alfuzosin (UROXATRAL) 10 MG 24 hr tablet Take 10 mg by  mouth at bedtime.  99  . Ascorbic Acid (VITAMIN C) 500 MG tablet Take 500 mg by mouth daily.      Marland Kitchen atorvastatin (LIPITOR) 40 MG tablet Take 1 tablet (40 mg total) by mouth daily. 90 tablet 3  . Calcium Carbonate-Vitamin D (CALTRATE 600+D) 600-400 MG-UNIT per tablet Take 1 tablet by mouth daily.      . cilostazol (PLETAL) 100 MG tablet Take 1 tablet (100 mg total) by mouth 2 (two) times daily. 180 tablet 3  . ciprofloxacin (CIPRO) 500 MG tablet Take 1 tablet (500 mg total) by mouth 2 (two) times daily. 3 tablet 0  . finasteride (PROSCAR) 5 MG tablet Take 5 mg by mouth daily.  3  . Inositol Niacinate (NIACIN FLUSH FREE) 500 MG CAPS Take 500 mg by mouth at bedtime.    . Multiple Vitamin (MULTIVITAMIN WITH MINERALS) TABS tablet Take 1 tablet by mouth daily.    . niacin (NIASPAN) 1000 MG CR tablet Take 1,000 mg by mouth at bedtime.     No current facility-administered medications for this visit.    Facility-Administered Medications Ordered in Other Visits  Medication Dose Route Frequency Provider Last Rate Last Dose  . [START ON 12/05/2016] 0.9 %  sodium chloride infusion   Intravenous Continuous Sherren Mocha, MD      . Derrill Memo ON 12/05/2016] cefUROXime (ZINACEF) 1.5 g in dextrose 5 % 50 mL IVPB  1.5 g Intravenous To OR Karren Cobble, RPH      . [START ON 12/05/2016] dexmedetomidine (PRECEDEX) 400 MCG/100ML (4 mcg/mL) infusion  0.1-0.7 mcg/kg/hr Intravenous To OR Karren Cobble, RPH      . [START ON 12/05/2016] DOPamine (INTROPIN) 800 mg in dextrose 5 % 250 mL (3.2 mg/mL) infusion  0-10 mcg/kg/min Intravenous To OR Karren Cobble, RPH      . [START ON 12/05/2016] EPINEPHrine (ADRENALIN) 4 mg in dextrose 5 % 250 mL (0.016 mg/mL) infusion  0-10 mcg/min Intravenous To OR Karren Cobble, RPH      . [START ON 12/05/2016] heparin 30,000 units/NS 1000 mL solution for CELLSAVER   Other To OR Karren Cobble, RPH      . [START ON 12/05/2016] insulin regular (NOVOLIN R,HUMULIN R) 250 Units in  sodium chloride 0.9 % 250 mL (1 Units/mL) infusion   Intravenous To OR Karren Cobble, RPH      . [START ON 12/05/2016] magnesium sulfate (IV Push/IM) injection 40 mEq  40 mEq Other To OR Karren Cobble, RPH      . [START ON 12/05/2016] nitroGLYCERIN 50 mg in dextrose 5 % 250 mL (0.2 mg/mL) infusion  2-200 mcg/min Intravenous To OR Karren Cobble, RPH      . [  START ON 12/05/2016] norepinephrine (LEVOPHED) 4 mg in dextrose 5 % 250 mL (0.016 mg/mL) infusion  0-10 mcg/min Intravenous To OR Karren Cobble, RPH      . [START ON 12/05/2016] phenylephrine (NEO-SYNEPHRINE) 20 mg in sodium chloride 0.9 % 250 mL (0.08 mg/mL) infusion  30-200 mcg/min Intravenous To OR Karren Cobble, RPH      . [START ON 12/05/2016] potassium chloride injection 80 mEq  80 mEq Other To OR Karren Cobble, RPH      . [START ON 12/05/2016] vancomycin (VANCOCIN) 1,250 mg in sodium chloride 0.9 % 250 mL IVPB  1,250 mg Intravenous To OR Crystal Trellis Moment, RPH        No Known Allergies    Review of Systems:   General:                      decreased appetite, decreased energy, no weight gain, + weight loss, no fever             Cardiac:                       no chest pain with exertion, no chest pain at rest, + SOB with exertion, no resting SOB, no PND, no orthopnea, no palpitations, no arrhythmia, no atrial fibrillation, no LE edema, no dizzy spells, no syncope             Respiratory:                 Mild exertional shortness of breath, no home oxygen, no productive cough, no dry cough, no bronchitis, no wheezing, no hemoptysis, no asthma, no pain with inspiration or cough, no sleep apnea, no CPAP at night             GI:                               no difficulty swallowing, no reflux, no frequent heartburn, no hiatal hernia, no abdominal pain, no constipation, no diarrhea, no hematochezia, no hematemesis, no melena             GU:                              no dysuria,  + frequency, no urinary tract  infection, no hematuria, + enlarged prostate, no kidney stones, no kidney disease             Vascular:                     no pain suggestive of claudication, no pain in feet, no leg cramps, no varicose veins, no DVT, no non-healing foot ulcer             Neuro:                         no stroke, no TIA's, no seizures, no headaches, no temporary blindness one eye,  no slurred speech, no peripheral neuropathy, no chronic pain, no instability of gait, no memory/cognitive dysfunction             Musculoskeletal:         no arthritis, no joint swelling, no myalgias, no difficulty walking, normal mobility              Skin:  no rash, no itching, no skin infections, no pressure sores or ulcerations             Psych:                         + anxiety, no depression, no nervousness, no unusual recent stress             Eyes:                           no blurry vision, + floaters, no recent vision changes, + wears glasses or contacts             ENT:                            no hearing loss, no loose or painful teeth, + dentures             Hematologic:               no easy bruising, no abnormal bleeding, no clotting disorder, no frequent epistaxis             Endocrine:                   no diabetes, does not check CBG's at home                              Physical Exam:   There were no vitals taken for this visit.  General:  Elderly but well-appearing, looks physically fit  HEENT:  Unremarkable, NCAT, EOMI, oropharynx clear  Neck:   no JVD, no bruits, no adenopathy or thyromegaly  Chest:   clear to auscultation, symmetrical breath sounds, no wheezes, no rhonchi   CV:   RRR, grade III/VI crescendo/decrescendo murmur heard best at RSB,  no diastolic murmur  Abdomen:  soft, non-tender, no masses or organomegaly. Small right inguinal hernia.  Extremities:  warm, well-perfused, pulses diminished in feet, no LE edema  Rectal/GU  Deferred  Neuro:   Grossly non-focal and  symmetrical throughout  Skin:   Clean and dry, no rashes, no breakdown   Diagnostic Tests:  Result status: Final result                           [facility]                        [address], [place] [address]                            [phone]  ------------------------------------------------------------------- Transthoracic Echocardiography  Patient:    Brett [name], Brett [name] MR #:       [id number] Study Date: 11/07/2016 Gender:     M Age:        61 Height:     169.5 cm Weight:     61.2 kg BSA:        1.7 m^2 Pt. Status: Room:   ATTENDING    Sherren Mocha, MD  [facility]     Sherren Mocha, MD  REFERRING  Sherren Mocha, MD  SONOGRAPHER  Cindy Hazy, RDCS  PERFORMING   [facility], Outpatient  cc:  ------------------------------------------------------------------- LV EF: 65% -   70%  ------------------------------------------------------------------- Indications:      I35.9 Aortic Valve Disorder.  ------------------------------------------------------------------- History:   PMH:  Acquired from the patient and from the patient&'s chart.  PMH:  CAD. Peripheral Vascular Disease.  Risk factors: Hypertension. Dyslipidemia.  ------------------------------------------------------------------- Study Conclusions  - Left ventricle: The cavity size was normal. There was severe   concentric hypertrophy. Systolic function was vigorous. The   estimated ejection fraction was in the range of 65% to 70%. Wall   motion was normal; there were no regional wall motion   abnormalities. Doppler parameters are consistent with abnormal   left ventricular relaxation (grade 1 diastolic dysfunction).   Doppler parameters are consistent with elevated ventricular   end-diastolic filling pressure. - Aortic valve: Trileaflet; severely thickened, severely calcified   leaflets. Valve mobility was restricted. There was critical    stenosis. Mean gradient (S): 75 mm Hg. Peak gradient (S): 131 mm   Hg. - Aortic root: The aortic root was normal in size. - Mitral valve: There was mild regurgitation. - Left atrium: The atrium was mildly dilated. - Right ventricle: The cavity size was normal. Wall thickness was   normal. Systolic function was normal. - Right atrium: The atrium was normal in size. - Tricuspid valve: There was mild regurgitation. - Pulmonic valve: There was trivial regurgitation. - Pulmonary arteries: Systolic pressure was within the normal   range. - Inferior vena cava: The vessel was normal in size. - Pericardium, extracardiac: There was no pericardial effusion.  Impressions:  - Critical aortic stenosis, mean aortic stenosis 75 mmHg.  ------------------------------------------------------------------- Labs, prior tests, procedures, and surgery: Coronary artery bypass grafting.  ------------------------------------------------------------------- Study data:   Study status:  Routine.  Procedure:  The patient reported no pain pre or post test. Transthoracic echocardiography for left ventricular function evaluation, for right ventricular function evaluation, and for assessment of valvular function. Image quality was adequate.  Study completion:  There were no complications.          Transthoracic echocardiography.  M-mode, complete 2D, spectral Doppler, and color Doppler.  Birthdate: Patient birthdate: November 03, 1940.  Age:  Patient is 76 yr old.  Sex: Gender: male.    BMI: 21.3 kg/m^2.  Blood pressure:     110/50 Patient status:  Outpatient.  Study date:  Study date: 11/07/2016. Study time: 12:47 PM.  Location:  [facility]  -------------------------------------------------------------------  ------------------------------------------------------------------- Left ventricle:  The cavity size was normal. There was severe concentric hypertrophy. Systolic function was vigorous.  The estimated ejection fraction was in the range of 65% to 70%. Wall motion was normal; there were no regional wall motion abnormalities. Doppler parameters are consistent with abnormal left ventricular relaxation (grade 1 diastolic dysfunction). Doppler parameters are consistent with elevated ventricular end-diastolic filling pressure.  ------------------------------------------------------------------- Aortic valve:   Trileaflet; severely thickened, severely calcified leaflets. Valve mobility was restricted.  Doppler:   There was critical stenosis.   There was no regurgitation.    VTI ratio of LVOT to aortic valve: 0.37. Valve area (VTI): 1.16 cm^2. Indexed valve area (VTI): 0.68 cm^2/m^2. Peak velocity ratio of LVOT to aortic valve: 0.3. Valve area (Vmax): 0.95 cm^2. Indexed valve area (Vmax): 0.56 cm^2/m^2. Mean velocity ratio of LVOT to aortic valve: 0.35. Valve area (Vmean): 1.1 cm^2. Indexed valve area (Vmean): 0.65 cm^2/m^2.    Mean gradient (S): 75 mm Hg. Peak gradient (  S): 131 mm Hg.  ------------------------------------------------------------------- Aorta:  Aortic root: The aortic root was normal in size.  ------------------------------------------------------------------- Mitral valve:   Structurally normal valve.   Mobility was not restricted.  Doppler:  Transvalvular velocity was within the normal range. There was no evidence for stenosis. There was mild regurgitation.    Peak gradient (D): 4 mm Hg.  ------------------------------------------------------------------- Left atrium:  The atrium was mildly dilated.  ------------------------------------------------------------------- Right ventricle:  The cavity size was normal. Wall thickness was normal. Systolic function was normal.  ------------------------------------------------------------------- Pulmonic valve:    Structurally normal valve.   Cusp separation was normal.  Doppler:  Transvalvular velocity  was within the normal range. There was no evidence for stenosis. There was trivial regurgitation.  ------------------------------------------------------------------- Tricuspid valve:   Structurally normal valve.    Doppler: Transvalvular velocity was within the normal range. There was mild regurgitation.  ------------------------------------------------------------------- Pulmonary artery:   The main pulmonary artery was normal-sized. Systolic pressure was within the normal range.  ------------------------------------------------------------------- Right atrium:  The atrium was normal in size.  ------------------------------------------------------------------- Pericardium:  There was no pericardial effusion.  ------------------------------------------------------------------- Systemic veins: Inferior vena cava: The vessel was normal in size.  ------------------------------------------------------------------- Measurements   Left ventricle                            Value          Reference  LV ID, ED, PLAX chordal           (L)     31.9  mm       43 - 52  LV ID, ES, PLAX chordal                   23.8  mm       23 - 38  LV fx shortening, PLAX chordal    (L)     25    %        >=29  LV PW thickness, ED                       15.6  mm       ---------  IVS/LV PW ratio, ED                       0.99           <=1.3  Stroke volume, 2D                         82    ml       ---------  Stroke volume/bsa, 2D                     48    ml/m^2   ---------  LV e&', lateral                            6.52  cm/s     ---------  LV E/e&', lateral                          14.95          ---------  LV e&', medial  7.02  cm/s     ---------  LV E/e&', medial                           13.89          ---------  LV e&', average                            6.77  cm/s     ---------  LV E/e&', average                          14.4           ---------    Ventricular  septum                        Value          Reference  IVS thickness, ED                         15.4  mm       ---------    LVOT                                      Value          Reference  LVOT ID, S                                20    mm       ---------  LVOT area                                 3.14  cm^2     ---------  LVOT ID                                   20    mm       ---------  LVOT peak velocity, S                     115   cm/s     ---------  LVOT mean velocity, S                     94.9  cm/s     ---------  LVOT VTI, S                               26.2  cm       ---------  LVOT peak gradient, S                     5     mm Hg    ---------  Stroke volume (SV), LVOT DP               82.3  ml       ---------  Stroke index (SV/bsa), LVOT DP            48.5  ml/m^2   ---------  Aortic valve                              Value          Reference  Aortic valve peak velocity, S             380   cm/s     ---------  Aortic valve mean velocity, S             270   cm/s     ---------  Aortic valve VTI, S                       70.9  cm       ---------  Aortic mean gradient, S                   33    mm Hg    ---------  Aortic peak gradient, S                   58    mm Hg    ---------  VTI ratio, LVOT/AV                        0.37           ---------  Aortic valve area, VTI                    1.16  cm^2     ---------  Aortic valve area/bsa, VTI                0.68  cm^2/m^2 ---------  Velocity ratio, peak, LVOT/AV             0.3            ---------  Aortic valve area, peak velocity          0.95  cm^2     ---------  Aortic valve area/bsa, peak               0.56  cm^2/m^2 ---------  velocity  Velocity ratio, mean, LVOT/AV             0.35           ---------  Aortic valve area, mean velocity          1.1   cm^2     ---------  Aortic valve area/bsa, mean               0.65  cm^2/m^2 ---------  velocity    Aorta                                     Value           Reference  Aortic root ID, ED                        34    mm       ---------    Left atrium                               Value          Reference  LA ID, A-P, ES  41    mm       ---------  LA ID/bsa, A-P                    (H)     2.42  cm/m^2   <=2.2  LA volume, S                              54    ml       ---------  LA volume/bsa, S                          31.8  ml/m^2   ---------  LA volume, ES, 1-p A4C                    38    ml       ---------  LA volume/bsa, ES, 1-p A4C                22.4  ml/m^2   ---------  LA volume, ES, 1-p A2C                    64    ml       ---------  LA volume/bsa, ES, 1-p A2C                37.7  ml/m^2   ---------    Mitral valve                              Value          Reference  Mitral E-wave peak velocity               97.5  cm/s     ---------  Mitral A-wave peak velocity               155   cm/s     ---------  Mitral deceleration time          (H)     324   ms       150 - 230  Mitral peak gradient, D                   4     mm Hg    ---------  Mitral E/A ratio, peak                    0.6            ---------  Mitral maximal regurg velocity,           693   cm/s     ---------  PISA    Tricuspid valve                           Value          Reference  Tricuspid regurg peak velocity            207   cm/s     ---------  Tricuspid peak RV-RA gradient             17    mm Hg    ---------    Right ventricle  Value          Reference  RV s&', lateral, S                         15.8  cm/s     ---------  Legend: (L)  and  (H)  mark values outside specified reference range.  ------------------------------------------------------------------- Prepared and Electronically Authenticated by  Ena Dawley, M.D. 2018-04-03T13:42:05     [facility]  Cardiac catheterization  Order# [id number]  Reading physician: Sherren Mocha, MD Ordering physician: Sherren Mocha, MD Study date:  11/21/16  Physicians   Panel Physicians Referring Physician Case Authorizing Physician  Sherren Mocha, MD (Primary)    Procedures   Right Heart Cath and Coronary/Graft Angiography  Conclusion   1. Known severe aortic stenosis 2. Severe 3 vessel CAD with severe diffuse proximal LAD stenosis, total occlusion of a severely calcified RCA, and severe LCx stenosis 3. S/P CABG with continued patency of all 5 bypass grafts 4. Severe calcified aortic valve with restricted opening 5. Severe calcified ascending aorta by fluoroscopy  Recommend: Continued TAVR evaluation for treatment of severe symptomatic aortic stenosis  Indications   Severe aortic stenosis [I35.0 (ICD-10-CM)]  Procedural Details/Technique   Technical Details INDICATION: Severe aortic stenosis - pre-TAVR evaluation  PROCEDURAL DETAILS: There was an indwelling IV in a right antecubital vein. Using normal sterile technique, the IV was changed out for a 5 Fr brachial sheath over a 0.018 inch wire. The left wrist was then prepped, draped, and anesthetized with 1% lidocaine. Using the modified Seldinger technique a 5/6 French Slender sheath was placed in the left radial artery. Intra-arterial verapamil was administered through the radial artery sheath. IV heparin was administered after a JR4 catheter was advanced into the central aorta. A Swan-Ganz catheter was used for the right heart catheterization. Standard protocol was followed for recording of right heart pressures and sampling of oxygen saturations. Fick cardiac output was calculated. Standard Judkins catheters were used for selective coronary angiography, LIMA angiography, and SVG angiography. Aortic root angiography is performed with a pigtail catheter. There were no immediate procedural complications. The patient was transferred to the post catheterization recovery area for further monitoring.    Estimated blood loss <50 mL.  During this procedure the patient was  administered the following to achieve and maintain moderate conscious sedation: Versed mg, Fentanyl mcg, while the patient's heart rate, blood pressure, and oxygen saturation were continuously monitored.    Coronary Findings   Dominance: Right  Left Anterior Descending  Prox LAD to Mid LAD lesion, 75% stenosed. The lesion is calcified. diffusely diseased LAD  First Diagonal Branch  Ost 1st Diag to 1st Diag lesion, 100% stenosed.  Left Circumflex  Prox Cx lesion, 95% stenosed. The lesion is calcified.  Right Coronary Artery  Ost RCA to Prox RCA lesion, 100% stenosed. The lesion is severely calcified.  Graft Angiography  saphenous Graft to RPDA  SVG and is normal in caliber and anatomically normal. Widely patent SVG-PDA  Free LIMA Graft to Dist LAD  LIMA graft was visualized by angiography and is normal in caliber and anatomically normal. The LAD has mild diffuse plaquing beyond the LIMA insertion. There are no areas of high-grade obstruction.  Sequential jump graft Graft to 2nd Mrg, 3rd Mrg  Seq SVG- OM3 and OM4. patent sequential graft to the distal OM branches  saphenous Graft to 1st Diag  SVG and is normal in caliber and anatomically normal.  Coronary Diagrams  Diagnostic Diagram       Implants     No implant documentation for this case.  PACS Images   Show images for Cardiac catheterization   Link to Procedure Log   Procedure Log    Hemo Data    Most Recent Value  Fick Cardiac Output 6.57 L/min  Fick Cardiac Output Index 3.82 (L/min)/BSA  RA A Wave 2 mmHg  RA V Wave 0 mmHg  RA Mean 0 mmHg  RV Systolic Pressure 29 mmHg  RV Diastolic Pressure -2 mmHg  RV EDP 3 mmHg  PA Systolic Pressure 26 mmHg  PA Diastolic Pressure 4 mmHg  PA Mean 14 mmHg  PW A Wave 9 mmHg  PW V Wave 5 mmHg  PW Mean 4 mmHg  AO Systolic Pressure 482 mmHg  AO Diastolic Pressure 72 mmHg  AO Mean 112 mmHg  QP/QS 1  TPVR Index 3.66 HRUI  TSVR Index 29.32 HRUI  TPVR/TSVR Ratio 0.13   CT  CORONARY MORPH W/CTA COR W/SCORE W/CA W/CM &/OR WO/CM (Accession [id number]) (Order [id number])  Imaging  Date: 11/27/2016 Department: [facility] CT IMAGING Released By: Danielle Dess Authorizing: Sherren Mocha, MD  Exam Information   Status Exam Begun  Exam Ended   Final [99] 11/27/2016 9:30 AM 11/27/2016 9:58 AM  PACS Images   Show images for CT CORONARY MORPH W/CTA COR W/SCORE W/CA W/CM &/OR WO/CM  Addendum   ADDENDUM REPORT: 11/27/2016 14:40  CLINICAL DATA:  76 year old male with severe aortic stenosis.  EXAM: Cardiac TAVR CT  TECHNIQUE: The patient was scanned on a Philips 256 scanner. A 120 kV retrospective scan was triggered in the descending thoracic aorta at 111 HU's. Gantry rotation speed was 270 msecs and collimation was .9 mm. 10 mg of iv metoprolol and no nitro were given. The 3D data set was reconstructed in 5% intervals of the R-R cycle. Systolic and diastolic phases were analyzed on a dedicated work station using MPR, MIP and VRT modes. The patient received 80 cc of contrast.  FINDINGS: Aortic Valve: Trileaflet, moderately calcified and thickened with severely restricted leaflet opening. Asymmetric calcification of the non-coronary leaflet. No calcifications extending into the LVOT.  Aorta: Normal caliber. Moderate to severe diffuse calcifications and atherosclerosis throughout the thoracic aorta. No dissection.  Sinotubular Junction:  28 x 27 mm  Ascending Thoracic Aorta:  30 x 30 mm  Aortic Arch:  25 x 23 mm  Descending Thoracic Aorta:  23 x 21 mm  Sinus of Valsalva Measurements:  Non-coronary:  33 mm  Right -coronary:  32 mm  Left -coronary:  34 mm  Coronary Artery Height above Annulus:  Left Main:  14 mm  Right Coronary:  16 mm  Virtual Basal Annulus Measurements:  Maximum/Minimum Diameter:  26 x 21 mm  Perimeter:  75 mm  Area:  426 mm  Optimum Fluoroscopic Angle for Delivery:  LAO 3 CAU  5  IMPRESSION: 1. Trileaflet, moderately calcified and thickened aortic valve with severely restricted leaflet opening. There is asymmetric calcification of the non-coronary leaflet and no calcifications extending into the LVOT. Annular measurements suitable for delivery of a 26 mm Edwards-SAPIEN 3 valve given diameter sizes.  2.  Sufficient annulus to coronary distance.  3. Optimum Fluoroscopic Angle for Delivery:  LAO 3 CAU 5  Ena Dawley   Electronically Signed   By: Ena Dawley   On: 11/27/2016 14:40  Study Result   EXAM: OVER-READ INTERPRETATION  CT CHEST  The following report is  an over-read performed by radiologist Dr. Collene Leyden [facility] on 11/27/2016. This over-read does not include interpretation of cardiac or coronary anatomy or pathology. The coronary CTA interpretation by the cardiologist is attached.  COMPARISON:  04/06/2016  FINDINGS: Cardiovascular: Diffuse aortic calcifications. No evidence of aortic aneurysm. Heart is mildly enlarged.  Mediastinum/Nodes: Small scattered mediastinal lymph nodes, none pathologically enlarged or changed since prior study, and likely reactive.  Lungs/Pleura: Emphysematous changes in the lungs. No confluent airspace opacity or suspicious nodule. No effusions.  Upper Abdomen: Imaging into the upper abdomen shows no acute findings.  Musculoskeletal: Chest wall soft tissues are unremarkable. No acute bony abnormality.  IMPRESSION: Diffuse calcifications throughout the visualized aorta. No aneurysm.  Mild cardiomegaly.  Borderline sized mediastinal lymph nodes, stable since prior study, likely reactive.  Emphysema.  Electronically Signed: By: Rolm Baptise M.D. On: 11/27/2016 10:09        CT Angio Abd/Pel w/ and/or w/o (Accession [id number]) (Order [id number])  Imaging  Date: 11/27/2016 Department: [facility] CT IMAGING Released By: Sheffield Slider Authorizing: Sherren Mocha, MD  Exam Information   Status Exam Begun  Exam Ended   Final [99] 11/27/2016 10:06 AM 11/27/2016 10:07 AM  PACS Images   Show images for CT Angio Abd/Pel w/ and/or w/o  Study Result   CLINICAL DATA:  76 year old male with history of severe aortic stenosis. Preprocedural study prior to potential transcatheter aortic valve replacement.  EXAM: CT ANGIOGRAPHY CHEST, ABDOMEN AND PELVIS  TECHNIQUE: Multidetector CT imaging through the chest, abdomen and pelvis was performed using the standard protocol during bolus administration of intravenous contrast. Multiplanar reconstructed images and MIPs were obtained and reviewed to evaluate the vascular anatomy.  CONTRAST:  150 mL of Isovue 370.  COMPARISON:  Chest CT 04/06/2016.  FINDINGS: CTA CHEST FINDINGS  Cardiovascular: Heart size is normal. There is no significant pericardial fluid, thickening or pericardial calcification. There is aortic atherosclerosis, as well as atherosclerosis of the great vessels of the mediastinum and the coronary arteries, including calcified atherosclerotic plaque in the left main, left anterior descending, left circumflex and right coronary arteries. Status post median sternotomy for CABG including LIMA to the LAD. Severe thickening calcification of the aortic valve. Calcification of the mitral annulus.  Mediastinum/Lymph Nodes: No pathologically enlarged mediastinal or hilar lymph nodes. Esophagus is unremarkable in appearance. No axillary lymphadenopathy.  Lungs/Pleura: 5 mm subpleural nodule in the anterior aspect of the right lower lobe abutting the major fissure (image 32 of series 402) is unchanged, compatible with a benign subpleural lymph node. No other larger more suspicious appearing pulmonary nodules or masses are noted. No acute consolidative airspace disease. No pleural effusions. Mild diffuse bronchial wall thickening with  mild centrilobular and paraseptal emphysema.  Musculoskeletal/Soft Tissues: Status post median sternotomy. There are no aggressive appearing lytic or blastic lesions noted in the visualized portions of the skeleton.  CTA ABDOMEN AND PELVIS FINDINGS  Hepatobiliary: No cystic or solid hepatic lesions. No intra or extrahepatic biliary ductal dilatation. Gallbladder is normal in appearance.  Pancreas: The pancreatic mass. No pancreatic ductal dilatation. No pancreatic or peripancreatic fluid or inflammatory changes.  Spleen: Unremarkable.  Adrenals/Urinary Tract: Large nonobstructive calculus in the upper pole collecting system of left kidney measuring up to 17 mm in diameter. Exophytic simple cyst in the upper pole of the right kidney measuring 5.6 cm in diameter. Other subcentimeter low-attenuation lesions in both kidneys are too small to definitively characterize, but are statistically likely to represent cysts.  No hydroureteronephrosis. Urinary bladder is unremarkable in appearance.  Stomach/Bowel: The appearance of the stomach is normal. There is no pathologic dilatation of small bowel or colon. Numerous colonic diverticulae are noted, particularly in the sigmoid colon and descending colon, without surrounding inflammatory changes to suggest an acute diverticulitis at this time. The appendix is not confidently identified and may be surgically absent. Regardless, there are no inflammatory changes noted adjacent to the cecum to suggest the presence of an acute appendicitis at this time.  Vascular/Lymphatic: Vascular findings and measurements pertinent to potential TAVR procedure, as detailed below. 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 Throughout this area affected by the dissection, there is also fusiform ectasia of the  infrarenal abdominal aorta measuring up to 2.5 cm in diameter. Celiac axis, superior mesenteric artery and inferior mesenteric artery are all widely patent without hemodynamically significant stenosis. Single renal arteries bilaterally are widely patent. No lymphadenopathy noted in the abdomen or pelvis.  Reproductive: Prostate gland is mildly enlarged with median lobe hypertrophy. Seminal vesicles are unremarkable in appearance.  Other: No significant volume of ascites.  No pneumoperitoneum.  Musculoskeletal: There are no aggressive appearing lytic or blastic lesions noted in the visualized portions of the skeleton.  VASCULAR MEASUREMENTS PERTINENT TO TAVR:  AORTA:  Minimal Aortic Diameter -  8 x 9 mm  Severity of Aortic Calcification -  severe  RIGHT PELVIS:  Right Common Iliac Artery -  Minimal Diameter - 6.3 x 4.1 mm  Tortuosity - mild  Calcification - severe  Right External Iliac Artery -  Minimal Diameter - 5.4 x 4.8 mm  Tortuosity - mild  Calcification - mild to moderate  Right Common Femoral Artery -  Minimal Diameter - 4.4 x 4.2 mm  Tortuosity - mild  Calcification - moderate  LEFT PELVIS:  Left Common Iliac Artery -  Minimal Diameter - 3.2 x 3.5 mm  Tortuosity - mild  Calcification - severe  Left External Iliac Artery -  Minimal Diameter - 5.1 x 3.2 mm  Tortuosity - mild  Calcification - mild to moderate  Left Common Femoral Artery -  Minimal Diameter - 5.4 x 3.9 mm  Tortuosity - mild  Calcification - moderate  Review of the MIP images confirms the above findings.  IMPRESSION: 1. Vascular findings and measurements pertinent to potential TAVR procedure, as detailed above. This patient does not appear to have suitable pelvic arterial access on either side. 2. Severe thickening calcification of the aortic valve, compatible with the reported clinical history of aortic stenosis. 3. Aortic  atherosclerosis, in addition to left main and 3 vessel coronary artery disease. Status post median sternotomy for CABG including LIMA to the LAD. 4. 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 5. Large 17 mm nonobstructive calculus in the upper pole collecting system of left kidney. 6. Colonic diverticulosis without evidence of acute diverticulitis at this time. 7. Mild diffuse bronchial wall thickening with mild centrilobular and paraseptal emphysema; imaging findings suggestive of underlying COPD. 8. Additional incidental findings, as above.   Electronically Signed   By: Vinnie Langton M.D.   On: 11/28/2016 15:58       RISK SCORES About the STS Risk Calculator Procedure: AV Replacement  Risk of Mortality: 4.468%  Morbidity or Mortality: 24.796%  Long Length of Stay: 9.655%  Short Length of Stay: 28.745%  Permanent Stroke: 1.888%  Prolonged Ventilation: 18.663%  DSW Infection: 0.296%  Renal Failure: 5.671%  Reoperation: 9.54%   Impression:  This 76 year old gentleman has stage D severe/critical, symptomatic aortic stenosis with NYHA class II symptoms of shortness of breath and fatigue with exertion consistent with chronic diastolic heart failure. I have personally reviewed his echo, cath and CT scans. His echo shows critical AS with severe calcification and thickening of all 3 valve leaflets with restricted leaflet mobility. The mean gradient has gone from 42 mm Hg last year to 75 mm now. His cath shows that all 5 grafts are patent with no significant stenoses. His LV systolic function is normal. He has diffuse calcific plaque involving the ascending aorta and aortic arch noted on fluoroscopy and CT. I think AVR is certainly indicated in this patient. His operative risk for open surgical AVR would be high given the  redo nature of his surgery and the calcified ascending aorta. It would be much higher than the 4.5% STS PROM. I think TAVR is the best option for him. His gated cardiac CT shows anatomy favorable for a Sapien 3 valve. His abdominal and pelvic CT shows extensive aortic atherosclerosis with a short segment dissection in the infrarenal aorta. He has significant calcific plaque in the iliac and femoral vessels with significant lumenal narrowing particularly on the left but I think he may have adequate diameter on the right for a transfemoral insertion. If this proves not possible then a transapical insertion can be done as an alternative. I reviewed the CT images with him and his wife and the possible need for transapical insertion. All of their questions have been answered.  The patient was counseled at length regarding treatment alternatives for management of severe symptomatic aortic stenosis. The risks and benefits of surgical intervention has been discussed in detail. Long-term prognosis with medical therapy was discussed. Alternative approaches such as conventional surgical aortic valve replacement, transcatheter aortic valve replacement, and palliative medical therapy were compared and contrasted at length. This discussion was placed in the context of the patient's own specific clinical presentation and past medical history. All of their questions been addressed. The patient is eager to proceed with surgical management as soon as possible.   The patient has been advised of a variety of complications that might develop including but not limited to risks of death, stroke, paravalvular leak, aortic dissection or other major vascular complications, aortic annulus rupture, device embolization, cardiac rupture or perforation, mitral regurgitation, acute myocardial infarction, arrhythmia, heart block or bradycardia requiring permanent pacemaker placement, congestive heart failure, respiratory failure, renal failure,  pneumonia, infection, other late complications related to structural valve deterioration or migration, or other complications that might ultimately cause a temporary or permanent loss of functional independence or other long term morbidity. The patient provides full informed consent for the procedure as described and all questions were answered.     Plan:  Right transfemoral TAVR , possible transapical insertion if required.   I spent 45 minutes performing this consultation and > 50% of this time was spent face to face counseling and coordinating the care of this patient's severe aortic stenosis.  Gaye Pollack, MD 12/04/2016

## 2016-12-04 NOTE — Telephone Encounter (Signed)
RX for Cipro 50 mg BID #3 tablets NO refills called to [facility]. Patient aware.

## 2016-12-05 ENCOUNTER — Inpatient Hospital Stay (HOSPITAL_COMMUNITY)
Admission: RE | Admit: 2016-12-05 | Discharge: 2016-12-07 | DRG: 267 | Disposition: A | Discharge status: Home / Self Care | Payer: Medicare Other | Source: Ambulatory Visit | Attending: Thoracic Surgery (Cardiothoracic Vascular Surgery) | Admitting: Thoracic Surgery (Cardiothoracic Vascular Surgery)

## 2016-12-05 ENCOUNTER — Inpatient Hospital Stay (HOSPITAL_COMMUNITY): Payer: Medicare Other

## 2016-12-05 ENCOUNTER — Inpatient Hospital Stay (HOSPITAL_COMMUNITY): Payer: Medicare Other | Admitting: Anesthesiology

## 2016-12-05 ENCOUNTER — Encounter (HOSPITAL_COMMUNITY): Payer: Self-pay | Admitting: *Deleted

## 2016-12-05 ENCOUNTER — Encounter (HOSPITAL_COMMUNITY)
Admission: RE | Disposition: A | Discharge status: Home / Self Care | Payer: Self-pay | Source: Ambulatory Visit | Attending: Thoracic Surgery (Cardiothoracic Vascular Surgery)

## 2016-12-05 ENCOUNTER — Encounter: Payer: Medicare Other | Admitting: Thoracic Surgery (Cardiothoracic Vascular Surgery)

## 2016-12-05 ENCOUNTER — Inpatient Hospital Stay (HOSPITAL_COMMUNITY): Payer: Medicare Other | Admitting: Emergency Medicine

## 2016-12-05 DIAGNOSIS — I350 Nonrheumatic aortic (valve) stenosis: Principal | ICD-10-CM | POA: Diagnosis present

## 2016-12-05 DIAGNOSIS — I088 Other rheumatic multiple valve diseases: Secondary | ICD-10-CM | POA: Diagnosis not present

## 2016-12-05 DIAGNOSIS — F17210 Nicotine dependence, cigarettes, uncomplicated: Secondary | ICD-10-CM | POA: Diagnosis present

## 2016-12-05 DIAGNOSIS — D539 Nutritional anemia, unspecified: Secondary | ICD-10-CM | POA: Diagnosis present

## 2016-12-05 DIAGNOSIS — Z951 Presence of aortocoronary bypass graft: Secondary | ICD-10-CM

## 2016-12-05 DIAGNOSIS — I25810 Atherosclerosis of coronary artery bypass graft(s) without angina pectoris: Secondary | ICD-10-CM | POA: Diagnosis present

## 2016-12-05 DIAGNOSIS — I739 Peripheral vascular disease, unspecified: Secondary | ICD-10-CM | POA: Diagnosis present

## 2016-12-05 DIAGNOSIS — Z006 Encounter for examination for normal comparison and control in clinical research program: Secondary | ICD-10-CM | POA: Diagnosis not present

## 2016-12-05 DIAGNOSIS — J439 Emphysema, unspecified: Secondary | ICD-10-CM | POA: Diagnosis not present

## 2016-12-05 DIAGNOSIS — Z954 Presence of other heart-valve replacement: Secondary | ICD-10-CM | POA: Diagnosis not present

## 2016-12-05 DIAGNOSIS — I110 Hypertensive heart disease with heart failure: Secondary | ICD-10-CM | POA: Diagnosis present

## 2016-12-05 DIAGNOSIS — Z79899 Other long term (current) drug therapy: Secondary | ICD-10-CM | POA: Diagnosis not present

## 2016-12-05 DIAGNOSIS — D631 Anemia in chronic kidney disease: Secondary | ICD-10-CM | POA: Diagnosis present

## 2016-12-05 DIAGNOSIS — E785 Hyperlipidemia, unspecified: Secondary | ICD-10-CM | POA: Diagnosis present

## 2016-12-05 DIAGNOSIS — I5032 Chronic diastolic (congestive) heart failure: Secondary | ICD-10-CM | POA: Diagnosis present

## 2016-12-05 DIAGNOSIS — Z952 Presence of prosthetic heart valve: Secondary | ICD-10-CM

## 2016-12-05 DIAGNOSIS — I2510 Atherosclerotic heart disease of native coronary artery without angina pectoris: Secondary | ICD-10-CM | POA: Diagnosis present

## 2016-12-05 DIAGNOSIS — D500 Iron deficiency anemia secondary to blood loss (chronic): Secondary | ICD-10-CM | POA: Diagnosis present

## 2016-12-05 DIAGNOSIS — I10 Essential (primary) hypertension: Secondary | ICD-10-CM | POA: Diagnosis not present

## 2016-12-05 DIAGNOSIS — I973 Postprocedural hypertension: Secondary | ICD-10-CM | POA: Diagnosis not present

## 2016-12-05 DIAGNOSIS — Z452 Encounter for adjustment and management of vascular access device: Secondary | ICD-10-CM | POA: Diagnosis not present

## 2016-12-05 DIAGNOSIS — J9811 Atelectasis: Secondary | ICD-10-CM

## 2016-12-05 HISTORY — PX: TRANSCATHETER AORTIC VALVE REPLACEMENT, TRANSFEMORAL: SHX6400

## 2016-12-05 HISTORY — PX: TEE WITHOUT CARDIOVERSION: SHX5443

## 2016-12-05 HISTORY — DX: Presence of prosthetic heart valve: Z95.2

## 2016-12-05 LAB — POCT I-STAT, CHEM 8
BUN: 17 mg/dL (ref 6–20)
BUN: 15 mg/dL (ref 6–20)
CALCIUM ION: 1.26 mmol/L (ref 1.15–1.40)
CHLORIDE: 104 mmol/L (ref 101–111)
Calcium, Ion: 1.29 mmol/L (ref 1.15–1.40)
Chloride: 104 mmol/L (ref 101–111)
Creatinine, Ser: 0.6 mg/dL — ABNORMAL LOW (ref 0.61–1.24)
Creatinine, Ser: 0.8 mg/dL (ref 0.61–1.24)
GLUCOSE: 112 mg/dL — AB (ref 65–99)
Glucose, Bld: 131 mg/dL — ABNORMAL HIGH (ref 65–99)
HCT: 32 % — ABNORMAL LOW (ref 39.0–52.0)
HEMATOCRIT: 29 % — AB (ref 39.0–52.0)
HEMOGLOBIN: 10.9 g/dL — AB (ref 13.0–17.0)
Hemoglobin: 9.9 g/dL — ABNORMAL LOW (ref 13.0–17.0)
Potassium: 3.8 mmol/L (ref 3.5–5.1)
Potassium: 3.9 mmol/L (ref 3.5–5.1)
SODIUM: 139 mmol/L (ref 135–145)
SODIUM: 138 mmol/L (ref 135–145)
TCO2: 30 mmol/L (ref 0–100)
TCO2: 28 mmol/L (ref 0–100)

## 2016-12-05 LAB — POCT I-STAT 4, (NA,K, GLUC, HGB,HCT)
Glucose, Bld: 129 mg/dL — ABNORMAL HIGH (ref 65–99)
HCT: 32 % — ABNORMAL LOW (ref 39.0–52.0)
Hemoglobin: 10.9 g/dL — ABNORMAL LOW (ref 13.0–17.0)
POTASSIUM: 4.1 mmol/L (ref 3.5–5.1)
SODIUM: 140 mmol/L (ref 135–145)

## 2016-12-05 LAB — POCT I-STAT 3, ART BLOOD GAS (G3+)
BICARBONATE: 25 mmol/L (ref 20.0–28.0)
O2 SAT: 98 %
PO2 ART: 107 mmHg (ref 83.0–108.0)
TCO2: 26 mmol/L (ref 0–100)
pCO2 arterial: 40.6 mmHg (ref 32.0–48.0)
pH, Arterial: 7.395 (ref 7.350–7.450)

## 2016-12-05 LAB — GLUCOSE, CAPILLARY
Glucose-Capillary: 125 mg/dL — ABNORMAL HIGH (ref 65–99)
Glucose-Capillary: 136 mg/dL — ABNORMAL HIGH (ref 65–99)

## 2016-12-05 LAB — CBC
HCT: 30.8 % — ABNORMAL LOW (ref 39.0–52.0)
HEMOGLOBIN: 10.8 g/dL — AB (ref 13.0–17.0)
MCH: 34.1 pg — ABNORMAL HIGH (ref 26.0–34.0)
MCHC: 35.1 g/dL (ref 30.0–36.0)
MCV: 97.2 fL (ref 78.0–100.0)
PLATELETS: 159 10*3/uL (ref 150–400)
RBC: 3.17 MIL/uL — AB (ref 4.22–5.81)
RDW: 13.5 % (ref 11.5–15.5)
WBC: 9.8 10*3/uL (ref 4.0–10.5)

## 2016-12-05 LAB — PREPARE RBC (CROSSMATCH)

## 2016-12-05 LAB — PROTIME-INR
INR: 1.18
PROTHROMBIN TIME: 15.1 s (ref 11.4–15.2)

## 2016-12-05 LAB — APTT: APTT: 39 s — AB (ref 24–36)

## 2016-12-05 SURGERY — IMPLANTATION, AORTIC VALVE, TRANSCATHETER, FEMORAL APPROACH
Anesthesia: General | Site: Chest

## 2016-12-05 MED ORDER — FENTANYL CITRATE (PF) 100 MCG/2ML IJ SOLN
INTRAMUSCULAR | Status: DC | PRN
  Administered 2016-12-05 (×4): 25 ug via INTRAVENOUS
  Administered 2016-12-05: 150 ug via INTRAVENOUS

## 2016-12-05 MED ORDER — IODIXANOL 320 MG/ML IV SOLN
INTRAVENOUS | Status: DC | PRN
  Administered 2016-12-05: 150 mL via INTRAVENOUS

## 2016-12-05 MED ORDER — ONDANSETRON HCL 4 MG/2ML IJ SOLN
INTRAMUSCULAR | Status: DC | PRN
  Administered 2016-12-05: 4 mg via INTRAVENOUS

## 2016-12-05 MED ORDER — FENTANYL CITRATE (PF) 250 MCG/5ML IJ SOLN
INTRAMUSCULAR | Status: AC
  Filled 2016-12-05: qty 5

## 2016-12-05 MED ORDER — CHLORHEXIDINE GLUCONATE 4 % EX LIQD: 60.0000 mL | Freq: Once | CUTANEOUS | Status: DC

## 2016-12-05 MED ORDER — SODIUM CHLORIDE 0.9 % IV SOLN: 1.0000 mL/kg/h | INTRAVENOUS | Status: AC

## 2016-12-05 MED ORDER — SODIUM CHLORIDE 0.9 % IV SOLN
30.0000 meq | Freq: Once | INTRAVENOUS | Status: DC
  Filled 2016-12-05: qty 15

## 2016-12-05 MED ORDER — ROCURONIUM BROMIDE 100 MG/10ML IV SOLN
INTRAVENOUS | Status: DC | PRN
  Administered 2016-12-05: 10 mg via INTRAVENOUS
  Administered 2016-12-05: 50 mg via INTRAVENOUS
  Administered 2016-12-05: 5 mg via INTRAVENOUS

## 2016-12-05 MED ORDER — OXYCODONE HCL 5 MG PO TABS: 5.0000 mg | ORAL_TABLET | ORAL | Status: DC | PRN

## 2016-12-05 MED ORDER — HEPARIN SODIUM (PORCINE) 1000 UNIT/ML IJ SOLN
INTRAMUSCULAR | Status: DC | PRN
  Administered 2016-12-05: 7000 [IU] via INTRAVENOUS
  Administered 2016-12-05: 2000 [IU] via INTRAVENOUS

## 2016-12-05 MED ORDER — ASPIRIN EC 325 MG PO TBEC: 325.0000 mg | DELAYED_RELEASE_TABLET | Freq: Every day | ORAL | Status: DC

## 2016-12-05 MED ORDER — ORAL CARE MOUTH RINSE: 15.0000 mL | Freq: Two times a day (BID) | OROMUCOSAL | Status: DC

## 2016-12-05 MED ORDER — MORPHINE SULFATE (PF) 4 MG/ML IV SOLN: 1.0000 mg | INTRAVENOUS | Status: DC | PRN

## 2016-12-05 MED ORDER — METOPROLOL TARTRATE 25 MG/10 ML ORAL SUSPENSION: 12.5000 mg | Freq: Two times a day (BID) | ORAL | Status: DC

## 2016-12-05 MED ORDER — ROCURONIUM BROMIDE 10 MG/ML (PF) SYRINGE
PREFILLED_SYRINGE | INTRAVENOUS | Status: AC
Start: 2016-12-05 — End: 2016-12-05
  Filled 2016-12-05: qty 5

## 2016-12-05 MED ORDER — ALBUMIN HUMAN 5 % IV SOLN
INTRAVENOUS | Status: DC | PRN
  Administered 2016-12-05: 08:00:00 via INTRAVENOUS

## 2016-12-05 MED ORDER — PANTOPRAZOLE SODIUM 40 MG PO TBEC
40.0000 mg | DELAYED_RELEASE_TABLET | Freq: Every day | ORAL | Status: DC
  Administered 2016-12-07: 40 mg via ORAL
  Filled 2016-12-05: qty 1

## 2016-12-05 MED ORDER — PROPOFOL 10 MG/ML IV BOLUS
INTRAVENOUS | Status: AC
  Filled 2016-12-05: qty 20

## 2016-12-05 MED ORDER — ONDANSETRON HCL 4 MG/2ML IJ SOLN: 4.0000 mg | Freq: Four times a day (QID) | INTRAMUSCULAR | Status: DC | PRN

## 2016-12-05 MED ORDER — SODIUM CHLORIDE 0.9 % IV SOLN
0.0000 ug/min | INTRAVENOUS | Status: DC
  Filled 2016-12-05: qty 2

## 2016-12-05 MED ORDER — METOPROLOL TARTRATE 12.5 MG HALF TABLET
12.5000 mg | ORAL_TABLET | Freq: Two times a day (BID) | ORAL | Status: DC
  Administered 2016-12-05 – 2016-12-06 (×2): 12.5 mg via ORAL
  Filled 2016-12-05 (×2): qty 1

## 2016-12-05 MED ORDER — SUGAMMADEX SODIUM 200 MG/2ML IV SOLN
INTRAVENOUS | Status: AC
  Filled 2016-12-05: qty 2

## 2016-12-05 MED ORDER — LACTATED RINGERS IV SOLN
INTRAVENOUS | Status: DC | PRN
  Administered 2016-12-05 (×2): via INTRAVENOUS

## 2016-12-05 MED ORDER — METOPROLOL TARTRATE 5 MG/5ML IV SOLN
2.5000 mg | INTRAVENOUS | Status: DC | PRN
  Administered 2016-12-06: 2.5 mg via INTRAVENOUS
  Filled 2016-12-05: qty 5

## 2016-12-05 MED ORDER — MIDAZOLAM HCL 2 MG/2ML IJ SOLN: 2.0000 mg | INTRAMUSCULAR | Status: DC | PRN

## 2016-12-05 MED ORDER — LACTATED RINGERS IV SOLN
INTRAVENOUS | Status: DC | PRN
  Administered 2016-12-05: 08:00:00 via INTRAVENOUS

## 2016-12-05 MED ORDER — ASPIRIN 81 MG PO CHEW: 324.0000 mg | CHEWABLE_TABLET | Freq: Every day | ORAL | Status: DC

## 2016-12-05 MED ORDER — MORPHINE SULFATE (PF) 2 MG/ML IV SOLN: 1.0000 mg | INTRAVENOUS | Status: DC | PRN

## 2016-12-05 MED ORDER — CHLORHEXIDINE GLUCONATE 0.12 % MT SOLN
15.0000 mL | Freq: Once | OROMUCOSAL | Status: AC
  Administered 2016-12-05: 15 mL via OROMUCOSAL
  Filled 2016-12-05: qty 15

## 2016-12-05 MED ORDER — ONDANSETRON HCL 4 MG/2ML IJ SOLN
INTRAMUSCULAR | Status: AC
  Filled 2016-12-05: qty 2

## 2016-12-05 MED ORDER — SODIUM CHLORIDE 0.9 % IV SOLN
INTRAVENOUS | Status: DC | PRN
  Administered 2016-12-05: 09:00:00 1500 mL

## 2016-12-05 MED ORDER — HEPARIN SODIUM (PORCINE) 1000 UNIT/ML IJ SOLN
INTRAMUSCULAR | Status: AC
  Filled 2016-12-05: qty 2

## 2016-12-05 MED ORDER — SUGAMMADEX SODIUM 200 MG/2ML IV SOLN
INTRAVENOUS | Status: DC | PRN
Start: 2016-12-05 — End: 2016-12-05
  Administered 2016-12-05: 125 mg via INTRAVENOUS

## 2016-12-05 MED ORDER — ALBUMIN HUMAN 5 % IV SOLN
250.0000 mL | INTRAVENOUS | Status: AC | PRN
  Filled 2016-12-05: qty 250

## 2016-12-05 MED ORDER — ALFUZOSIN HCL ER 10 MG PO TB24
10.0000 mg | ORAL_TABLET | Freq: Every day | ORAL | Status: DC
  Administered 2016-12-06: 10 mg via ORAL
  Filled 2016-12-05: qty 1

## 2016-12-05 MED ORDER — LIDOCAINE HCL (CARDIAC) 20 MG/ML IV SOLN
INTRAVENOUS | Status: DC | PRN
  Administered 2016-12-05: 20 mg via INTRAVENOUS

## 2016-12-05 MED ORDER — PHENYLEPHRINE HCL 10 MG/ML IJ SOLN
INTRAMUSCULAR | Status: DC | PRN
Start: 2016-12-05 — End: 2016-12-05
  Administered 2016-12-05: 80 ug via INTRAVENOUS

## 2016-12-05 MED ORDER — ARTIFICIAL TEARS OPHTHALMIC OINT
TOPICAL_OINTMENT | OPHTHALMIC | Status: AC
  Filled 2016-12-05: qty 3.5

## 2016-12-05 MED ORDER — NITROGLYCERIN IN D5W 200-5 MCG/ML-% IV SOLN
0.0000 ug/min | INTRAVENOUS | Status: DC
  Filled 2016-12-05: qty 250

## 2016-12-05 MED ORDER — EPINEPHRINE PF 1 MG/ML IJ SOLN
INTRAMUSCULAR | Status: AC
  Filled 2016-12-05: qty 1

## 2016-12-05 MED ORDER — PROPOFOL 10 MG/ML IV BOLUS
INTRAVENOUS | Status: DC | PRN
  Administered 2016-12-05: 30 mg via INTRAVENOUS
  Administered 2016-12-05 (×2): 20 mg via INTRAVENOUS

## 2016-12-05 MED ORDER — PROTAMINE SULFATE 10 MG/ML IV SOLN
INTRAVENOUS | Status: DC | PRN
  Administered 2016-12-05: 10 mg via INTRAVENOUS
  Administered 2016-12-05: 20 mg via INTRAVENOUS
  Administered 2016-12-05: 10 mg via INTRAVENOUS
  Administered 2016-12-05: 20 mg via INTRAVENOUS
  Administered 2016-12-05 (×3): 10 mg via INTRAVENOUS

## 2016-12-05 MED ORDER — VANCOMYCIN HCL IN DEXTROSE 1-5 GM/200ML-% IV SOLN
1000.0000 mg | Freq: Once | INTRAVENOUS | Status: AC
  Administered 2016-12-05: 1000 mg via INTRAVENOUS
  Filled 2016-12-05: qty 200

## 2016-12-05 MED ORDER — PHENYLEPHRINE 40 MCG/ML (10ML) SYRINGE FOR IV PUSH (FOR BLOOD PRESSURE SUPPORT)
PREFILLED_SYRINGE | INTRAVENOUS | Status: AC
  Filled 2016-12-05: qty 10

## 2016-12-05 MED ORDER — CLOPIDOGREL BISULFATE 75 MG PO TABS
75.0000 mg | ORAL_TABLET | Freq: Every day | ORAL | Status: DC
  Administered 2016-12-06 – 2016-12-07 (×2): 75 mg via ORAL
  Filled 2016-12-05 (×2): qty 1

## 2016-12-05 MED ORDER — CHLORHEXIDINE GLUCONATE 4 % EX LIQD: 30.0000 mL | CUTANEOUS | Status: DC

## 2016-12-05 MED ORDER — PROTAMINE SULFATE 10 MG/ML IV SOLN
INTRAVENOUS | Status: AC
  Filled 2016-12-05: qty 25

## 2016-12-05 MED ORDER — DEXTROSE 5 % IV SOLN
1.5000 g | Freq: Two times a day (BID) | INTRAVENOUS | Status: DC
  Administered 2016-12-05 – 2016-12-06 (×3): 1.5 g via INTRAVENOUS
  Filled 2016-12-05 (×6): qty 1.5

## 2016-12-05 MED ORDER — DEXTROSE 5 % IV SOLN
INTRAVENOUS | Status: DC | PRN
  Administered 2016-12-05: 2 ug/min via INTRAVENOUS

## 2016-12-05 MED ORDER — FAMOTIDINE IN NACL 20-0.9 MG/50ML-% IV SOLN
20.0000 mg | Freq: Two times a day (BID) | INTRAVENOUS | Status: AC
  Administered 2016-12-06: 20 mg via INTRAVENOUS
  Filled 2016-12-05: qty 50

## 2016-12-05 MED ORDER — CHLORHEXIDINE GLUCONATE 0.12 % MT SOLN
15.0000 mL | OROMUCOSAL | Status: AC
  Administered 2016-12-05: 15 mL via OROMUCOSAL

## 2016-12-05 MED ORDER — TRAMADOL HCL 50 MG PO TABS: 50.0000 mg | ORAL_TABLET | ORAL | Status: DC | PRN

## 2016-12-05 MED ORDER — SODIUM CHLORIDE 0.9 % IV SOLN
1.0000 mL/kg/h | INTRAVENOUS | Status: AC
  Administered 2016-12-05: 1 mL/kg/h via INTRAVENOUS

## 2016-12-05 MED ORDER — FINASTERIDE 5 MG PO TABS
5.0000 mg | ORAL_TABLET | Freq: Every day | ORAL | Status: DC
  Administered 2016-12-06 – 2016-12-07 (×2): 5 mg via ORAL
  Filled 2016-12-05 (×2): qty 1

## 2016-12-05 MED ORDER — LACTATED RINGERS IV SOLN: 500.0000 mL | Freq: Once | INTRAVENOUS | Status: DC | PRN

## 2016-12-05 MED ORDER — LIDOCAINE 2% (20 MG/ML) 5 ML SYRINGE
INTRAMUSCULAR | Status: AC
  Filled 2016-12-05: qty 10

## 2016-12-05 MED ORDER — PHENYLEPHRINE HCL 10 MG/ML IJ SOLN
INTRAMUSCULAR | Status: DC | PRN
  Administered 2016-12-05: 10 ug/min via INTRAVENOUS

## 2016-12-05 MED ORDER — DEXMEDETOMIDINE HCL IN NACL 400 MCG/100ML IV SOLN
INTRAVENOUS | Status: DC | PRN
Start: 2016-12-05 — End: 2016-12-05
  Administered 2016-12-05: .2 ug/kg/h via INTRAVENOUS

## 2016-12-05 SURGICAL SUPPLY — 107 items
ADAPTER UNIV SWAN GANZ BIP (ADAPTER) ×2 IMPLANT
ADAPTER UNV SWAN GANZ BIP (ADAPTER) ×2
ADH SKN CLS APL DERMABOND .7 (GAUZE/BANDAGES/DRESSINGS) ×2
ADPR CATH UNV NS SG CATH (ADAPTER) ×1
ATTRACTOMAT 16X20 MAGNETIC DRP (DRAPES) IMPLANT
BAG BANDED W/RUBBER/TAPE 36X54 (MISCELLANEOUS) ×4 IMPLANT
BAG DECANTER FOR FLEXI CONT (MISCELLANEOUS) IMPLANT
BAG EQP BAND 135X91 W/RBR TAPE (MISCELLANEOUS) ×2
BAG SNAP BAND KOVER 36X36 (MISCELLANEOUS) ×8 IMPLANT
BLADE 10 SAFETY STRL DISP (BLADE) ×4 IMPLANT
BLADE CLIPPER SURG (BLADE) IMPLANT
BLADE CORE FAN STRYKER (BLADE) ×4 IMPLANT
BLADE STERNUM SYSTEM 6 (BLADE) IMPLANT
CABLE PACING FASLOC BIEGE (MISCELLANEOUS) ×4 IMPLANT
CABLE PACING FASLOC BLUE (MISCELLANEOUS) ×4 IMPLANT
CANISTER SUCT 3000ML PPV (MISCELLANEOUS) ×4 IMPLANT
CANNULA FEM VENOUS REMOTE 22FR (CANNULA) IMPLANT
CANNULA OPTISITE PERFUSION 16F (CANNULA) IMPLANT
CANNULA OPTISITE PERFUSION 18F (CANNULA) IMPLANT
CATH DIAG EXPO 6F VENT PIG 145 (CATHETERS) ×8 IMPLANT
CATH EXPO 5FR AL1 (CATHETERS) ×4 IMPLANT
CATH EXPO 5FR FR4 (CATHETERS) ×4 IMPLANT
CATH S G BIP PACING (SET/KITS/TRAYS/PACK) ×8 IMPLANT
CLIP TI MEDIUM 24 (CLIP) IMPLANT
CLIP TI WIDE RED SMALL 24 (CLIP) ×4 IMPLANT
CONT SPEC 4OZ CLIKSEAL STRL BL (MISCELLANEOUS) ×8 IMPLANT
COVER BACK TABLE 60X90IN (DRAPES) ×4 IMPLANT
COVER BACK TABLE 80X110 HD (DRAPES) ×4 IMPLANT
COVER DOME SNAP 22 D (MISCELLANEOUS) ×4 IMPLANT
COVER MAYO STAND STRL (DRAPES) ×4 IMPLANT
CRADLE DONUT ADULT HEAD (MISCELLANEOUS) ×4 IMPLANT
DERMABOND ADVANCED (GAUZE/BANDAGES/DRESSINGS) ×2
DERMABOND ADVANCED .7 DNX12 (GAUZE/BANDAGES/DRESSINGS) ×2 IMPLANT
DEVICE CLOSURE PERCLS PRGLD 6F (VASCULAR PRODUCTS) IMPLANT
DRAPE INCISE IOBAN 66X45 STRL (DRAPES) IMPLANT
DRAPE SLUSH MACHINE 52X66 (DRAPES) ×4 IMPLANT
DRAPE SURG IRRIG POUCH 19X23 (DRAPES) ×4 IMPLANT
DRSG TEGADERM 4X4.75 (GAUZE/BANDAGES/DRESSINGS) ×8 IMPLANT
ELECT REM PT RETURN 9FT ADLT (ELECTROSURGICAL) ×8
ELECTRODE REM PT RTRN 9FT ADLT (ELECTROSURGICAL) ×4 IMPLANT
FELT TEFLON 6X6 (MISCELLANEOUS) ×4 IMPLANT
FEMORAL VENOUS CANN RAP (CANNULA) IMPLANT
GAUZE SPONGE 4X4 12PLY STRL (GAUZE/BANDAGES/DRESSINGS) ×4 IMPLANT
GAUZE SPONGE 4X4 12PLY STRL LF (GAUZE/BANDAGES/DRESSINGS) ×8 IMPLANT
GLOVE BIO SURGEON STRL SZ7.5 (GLOVE) ×4 IMPLANT
GLOVE BIO SURGEON STRL SZ8 (GLOVE) ×12 IMPLANT
GLOVE EUDERMIC 7 POWDERFREE (GLOVE) ×4 IMPLANT
GLOVE ORTHO TXT STRL SZ7.5 (GLOVE) ×8 IMPLANT
GOWN STRL REUS W/ TWL LRG LVL3 (GOWN DISPOSABLE) ×12 IMPLANT
GOWN STRL REUS W/ TWL XL LVL3 (GOWN DISPOSABLE) ×12 IMPLANT
GOWN STRL REUS W/TWL LRG LVL3 (GOWN DISPOSABLE) ×24
GOWN STRL REUS W/TWL XL LVL3 (GOWN DISPOSABLE) ×24
GUIDEWIRE SAF TJ AMPL .035X180 (WIRE) ×4 IMPLANT
GUIDEWIRE SAFE TJ AMPLATZ EXST (WIRE) ×4 IMPLANT
GUIDEWIRE STRAIGHT .035 260CM (WIRE) ×4 IMPLANT
GUIDEWIRE WHOLEY .035 145 JTIP (WIRE) ×4 IMPLANT
INSERT FOGARTY 61MM (MISCELLANEOUS) ×4 IMPLANT
INSERT FOGARTY SM (MISCELLANEOUS) IMPLANT
INSERT FOGARTY XLG (MISCELLANEOUS) IMPLANT
KIT BASIN OR (CUSTOM PROCEDURE TRAY) ×4 IMPLANT
KIT DILATOR VASC 18G NDL (KITS) IMPLANT
KIT HEART LEFT (KITS) ×4 IMPLANT
KIT ROOM TURNOVER OR (KITS) ×4 IMPLANT
KIT SUCTION CATH 14FR (SUCTIONS) ×8 IMPLANT
NEEDLE PERC 18GX7CM (NEEDLE) ×4 IMPLANT
NS IRRIG 1000ML POUR BTL (IV SOLUTION) ×12 IMPLANT
PACK AORTA (CUSTOM PROCEDURE TRAY) ×4 IMPLANT
PAD ARMBOARD 7.5X6 YLW CONV (MISCELLANEOUS) ×8 IMPLANT
PAD ELECT DEFIB RADIOL ZOLL (MISCELLANEOUS) ×4 IMPLANT
PATCH TACHOSII LRG 9.5X4.8 (VASCULAR PRODUCTS) IMPLANT
PERCLOSE PROGLIDE 6F (VASCULAR PRODUCTS)
SET MICROPUNCTURE 5F STIFF (MISCELLANEOUS) ×4 IMPLANT
SHEATH AVANTI 11CM 8FR (MISCELLANEOUS) ×4 IMPLANT
SHEATH PINNACLE 6F 10CM (SHEATH) ×8 IMPLANT
SLEEVE REPOSITIONING LENGTH 30 (MISCELLANEOUS) ×4 IMPLANT
SPONGE LAP 4X18 X RAY DECT (DISPOSABLE) ×4 IMPLANT
STOPCOCK MORSE 400PSI 3WAY (MISCELLANEOUS) ×24 IMPLANT
SUT ETHIBOND X763 2 0 SH 1 (SUTURE) IMPLANT
SUT GORETEX CV 4 TH 22 36 (SUTURE) IMPLANT
SUT GORETEX CV4 TH-18 (SUTURE) IMPLANT
SUT GORETEX TH-18 36 INCH (SUTURE) IMPLANT
SUT MNCRL AB 3-0 PS2 18 (SUTURE) IMPLANT
SUT PROLENE 2 0 MH 48 (SUTURE) ×8 IMPLANT
SUT PROLENE 3 0 SH1 36 (SUTURE) IMPLANT
SUT PROLENE 4 0 RB 1 (SUTURE)
SUT PROLENE 4-0 RB1 .5 CRCL 36 (SUTURE) IMPLANT
SUT PROLENE 5 0 C 1 36 (SUTURE) IMPLANT
SUT PROLENE 6 0 C 1 30 (SUTURE) IMPLANT
SUT SILK  1 MH (SUTURE) ×2
SUT SILK 1 MH (SUTURE) ×2 IMPLANT
SUT SILK 2 0 SH CR/8 (SUTURE) IMPLANT
SUT VIC AB 2-0 CT1 27 (SUTURE)
SUT VIC AB 2-0 CT1 TAPERPNT 27 (SUTURE) IMPLANT
SUT VIC AB 2-0 CTX 36 (SUTURE) IMPLANT
SUT VIC AB 3-0 SH 8-18 (SUTURE) IMPLANT
SYR 10ML LL (SYRINGE) ×12 IMPLANT
SYR 30ML LL (SYRINGE) ×8 IMPLANT
SYR 50ML LL SCALE MARK (SYRINGE) ×4 IMPLANT
TOWEL OR 17X26 10 PK STRL BLUE (TOWEL DISPOSABLE) ×8 IMPLANT
TRANSDUCER W/STOPCOCK (MISCELLANEOUS) ×8 IMPLANT
TRAY FOLEY SILVER 14FR TEMP (SET/KITS/TRAYS/PACK) ×4 IMPLANT
TUBE SUCT INTRACARD DLP 20F (MISCELLANEOUS) IMPLANT
TUBING HIGH PRESSURE 120CM (CONNECTOR) ×4 IMPLANT
VALVE HEART TRANSCATH SZ3 26MM (Prosthesis & Implant Heart) ×4 IMPLANT
WIRE AMPLATZ SS-J .035X180CM (WIRE) ×8 IMPLANT
WIRE BENTSON .035X145CM (WIRE) ×4 IMPLANT
WIRE STIFF LUNDERQUIST 260CM (WIRE) ×4 IMPLANT

## 2016-12-05 NOTE — Anesthesia Procedure Notes (Signed)
Central Venous Catheter Insertion Performed by: Catalina Gravel, anesthesiologist Start/End5/08/2016 6:56 AM, 12/05/2016 7:06 AM Patient location: Pre-op. Preanesthetic checklist: patient identified, IV checked, site marked, risks and benefits discussed, surgical consent, monitors and equipment checked, pre-op evaluation, timeout performed and anesthesia consent Position: Trendelenburg Lidocaine 1% used for infiltration and patient sedated Hand hygiene performed , maximum sterile barriers used  and Seldinger technique used Catheter size: 8 Fr Total catheter length 16. Central line was placed.Double lumen Procedure performed using ultrasound guided technique. Ultrasound Notes:anatomy identified, needle tip was noted to be adjacent to the nerve/plexus identified, no ultrasound evidence of intravascular and/or intraneural injection and image(s) printed for medical record Attempts: 1 Following insertion, dressing applied, line sutured and Biopatch. Post procedure assessment: blood return through all ports  Patient tolerated the procedure well with no immediate complications.

## 2016-12-05 NOTE — Progress Notes (Signed)
CT surgery p.m. Rounds  Patient resting comfortably after TAVR Sinus rhythm rate 84 No groin hematoma Doing well

## 2016-12-05 NOTE — Interval H&P Note (Signed)
History and Physical Interval Note:  12/05/2016 7:35 AM  Brett [name]  has presented today for surgery, with the diagnosis of SEVERE AS   The various methods of treatment have been discussed with the patient and family. After consideration of risks, benefits and other options for treatment, the patient has consented to  Procedure(s): TRANSCATHETER AORTIC VALVE REPLACEMENT, TRANSFEMORAL (N/A) TRANSESOPHAGEAL ECHOCARDIOGRAM (TEE) (N/A) Possible, TRANSCATHETER AORTIC VALVE REPLACEMENT, TRANSAPICAL (N/A) as a surgical intervention .  The patient's history has been reviewed, patient examined, no change in status, stable for surgery.  I have reviewed the patient's chart and labs.  Questions were answered to the patient's satisfaction.     Sherren Mocha

## 2016-12-05 NOTE — Progress Notes (Signed)
  Echocardiogram Echocardiogram Transesophageal has been performed.  Brett [name] 12/05/2016, 10:03 AM

## 2016-12-05 NOTE — Anesthesia Procedure Notes (Signed)
Procedure Name: Intubation Date/Time: 12/05/2016 7:58 AM Performed by: Suzy Bouchard Pre-anesthesia Checklist: Patient identified, Emergency Drugs available, Suction available, Patient being monitored and Timeout performed Patient Re-evaluated:Patient Re-evaluated prior to inductionOxygen Delivery Method: Circle system utilized Preoxygenation: Pre-oxygenation with 100% oxygen Intubation Type: IV induction Ventilation: Mask ventilation without difficulty Laryngoscope Size: Miller and 2 Grade View: Grade I Tube type: Oral Tube size: 8.0 mm Number of attempts: 1 Airway Equipment and Method: Stylet Placement Confirmation: ETT inserted through vocal cords under direct vision,  positive ETCO2 and breath sounds checked- equal and bilateral Secured at: 22 cm Tube secured with: Tape Dental Injury: Teeth and Oropharynx as per pre-operative assessment  Comments: Left lower native tooth noted to be loose prior to airway instrumentation

## 2016-12-05 NOTE — Progress Notes (Signed)
Instructed pt we needed to get a urine speci, states he just went. Encouraged if he can go again to let us know.

## 2016-12-05 NOTE — Interval H&P Note (Signed)
History and Physical Interval Note:  12/05/2016 6:27 AM  Brett [name]  has presented today for surgery, with the diagnosis of SEVERE AS   The various methods of treatment have been discussed with the patient and family. After consideration of risks, benefits and other options for treatment, the patient has consented to  Procedure(s): TRANSCATHETER AORTIC VALVE REPLACEMENT, TRANSFEMORAL (N/A) TRANSESOPHAGEAL ECHOCARDIOGRAM (TEE) (N/A) Possible, TRANSCATHETER AORTIC VALVE REPLACEMENT, TRANSAPICAL (N/A) as a surgical intervention .  The patient's history has been reviewed, patient examined, no change in status, stable for surgery.  I have reviewed the patient's chart and labs.  Questions were answered to the patient's satisfaction.     Rexene Alberts

## 2016-12-05 NOTE — H&P (View-Only) (Signed)
   Valentina Gu. [name], MD 11/28/2016 1:58 PM

## 2016-12-05 NOTE — Anesthesia Preprocedure Evaluation (Addendum)
Anesthesia Evaluation  Patient identified by MRN, date of birth, ID band Patient awake    Reviewed: Allergy & Precautions, NPO status , Patient's Chart, lab work & pertinent test results  History of Anesthesia Complications Negative for: history of anesthetic complications  Airway Mallampati: I  TM Distance: >3 FB Neck ROM: Full    Dental  (+) Dental Advisory Given, Missing, Poor Dentition   Pulmonary former smoker,    breath sounds clear to auscultation- rhonchi       Cardiovascular hypertension, Pt. on medications (-) angina+ CAD, + CABG and + Peripheral Vascular Disease  + Valvular Problems/Murmurs AS  Rhythm:Regular + Systolic murmurs    Neuro/Psych    GI/Hepatic   Endo/Other    Renal/GU Renal disease     Musculoskeletal   Abdominal   Peds  Hematology  (+) anemia ,   Anesthesia Other Findings   Reproductive/Obstetrics                           Anesthesia Physical Anesthesia Plan  ASA: IV  Anesthesia Plan: General   Post-op Pain Management:    Induction: Intravenous  Airway Management Planned: Oral ETT  Additional Equipment: Arterial line, CVP, Ultrasound Guidance Line Placement and TEE  Intra-op Plan:   Post-operative Plan: Extubation in OR and Possible Post-op intubation/ventilation  Informed Consent: I have reviewed the patients History and Physical, chart, labs and discussed the procedure including the risks, benefits and alternatives for the proposed anesthesia with the patient or authorized representative who has indicated his/her understanding and acceptance.   Dental advisory given  Plan Discussed with: CRNA and Surgeon  Anesthesia Plan Comments:         Anesthesia Quick Evaluation

## 2016-12-05 NOTE — Op Note (Signed)
[facility]   MULTIDISCIPLINARY HEART VALVE TEAM   TAVR OPERATIVE NOTE   Date of Procedure:  12/05/2016  Preoperative Diagnosis: Severe Aortic Stenosis   Postoperative Diagnosis: Same   Procedure:    Transcatheter Aortic Valve Replacement - Percutaneous Right Transfemoral Approach  Edwards Sapien 3 THV (size 26 mm, model # 9600TFX, serial # [id number])   Co-Surgeons:  Valentina Gu. [name], MD and Sherren Mocha, MD  Anesthesiologist:  Laurie Panda, MD  Echocardiographer:  Ena Dawley, MD  Pre-operative Echo Findings:  Severe aortic stenosis  Normal left ventricular systolic function  Post-operative Echo Findings:  No paravalvular leak  Normal left ventricular systolic function   BRIEF CLINICAL NOTE AND INDICATIONS FOR SURGERY  Patient is a 76 year old male with history of coronary artery disease status post coronary artery bypass grafting in the remote past, aortic stenosis, hypertension, hyperlipidemia, and tobacco abuse who has been referred for surgical consultation to discuss treatment options for management of severe symptomatic aortic stenosis. The patient's cardiac history dates back to 2006 when he was found to have an abnormal electrocardiogram and underwent a stress Cardiolite exam that was abnormal. Diagnostic cardiac catheterization was performed and revealed severe three-vessel coronary artery disease with normal left ventricular function. The patient underwent uncomplicated coronary artery bypass grafting 5.  He did well for many years but developed aortic stenosis which has gradually progressed on follow-up echocardiograms.  Transthoracic echocardiogram performed in April 2017 revealed peak velocity across the aortic valve measured 4.2 m/s corresponding to mean transvalvular gradient of 42 mmHg. Left ventricular function remained normal with ejection fraction estimated 65-70%. The patient has been followed for several years by Dr. Burt Knack and remained  essentially asymptomatic until recently.  He was found to have iron deficient anemia and began to complain of exertional fatigue and shortness of breath. Symptoms did not improve following treatment of his anemia. Follow-up transthoracic echocardiogram performed 11/07/2016 revealed further progression of disease with peak velocity across the aortic valve ranging between 4 and 5 m/s corresponding to mean transvalvular gradient estimated as high 75 mmHg. Left ventricular systolic function remains normal.  Diagnostic cardiac catheterization performed 11/21/2016 revealed severe native coronary artery disease but continued patency of all 5 previously placed bypass grafts with no significant vein graft disease.  Right heart pressures were normal. The patient underwent CT angiography and was referred for elective surgical consultation.    Following the decision to proceed with transcatheter aortic valve replacement, a discussion has been held regarding what types of management strategies would be attempted intraoperatively in the event of life-threatening complications, including whether or not the patient would be considered a candidate for the use of cardiopulmonary bypass and/or conversion to open sternotomy for attempted surgical intervention.  The patient has been advised of a variety of complications that might develop including but not limited to risks of death, stroke, paravalvular leak, aortic dissection or other major vascular complications, aortic annulus rupture, device embolization, cardiac rupture or perforation, mitral regurgitation, acute myocardial infarction, arrhythmia, heart block or bradycardia requiring permanent pacemaker placement, congestive heart failure, respiratory failure, renal failure, pneumonia, infection, other late complications related to structural valve deterioration or migration, or other complications that might ultimately cause a temporary or permanent loss of functional  independence or other long term morbidity.  The patient provides full informed consent for the procedure as described and all questions were answered.      DETAILS OF THE OPERATIVE PROCEDURE  PREPARATION:    The patient is brought to the  operating room on the above mentioned date and central monitoring was established by the anesthesia team including placement of Swan-Ganz catheter and radial arterial line. The patient is placed in the supine position on the operating table.  Intravenous antibiotics are administered.  General endotracheal anesthesia is induced uneventfully.  A Foley catheter is placed.  Baseline transesophageal echocardiogram was performed. The patient's chest, abdomen, both groins, and both lower extremities are prepared and draped in a sterile manner. A time out procedure is performed.   PERIPHERAL ACCESS:    Using the modified Seldinger technique, femoral arterial and venous access was obtained with placement of 6 Fr sheaths on the left side.  A pigtail diagnostic catheter was passed through the left arterial sheath under fluoroscopic guidance into the aortic root.  A temporary transvenous pacemaker catheter was passed through the left femoral venous sheath under fluoroscopic guidance into the right ventricle.  The pacemaker was tested to ensure stable lead placement and pacemaker capture. Aortic root angiography was performed in order to determine the optimal angiographic angle for valve deployment.   TRANSFEMORAL ACCESS:   Percutaneous transfemoral access and sheath placement was performed by Dr Burt Knack. Please see his separate operative note for details. The patient was heparinized systemically and ACT verified > 250 seconds.    A 14 Fr transfemoral E-sheath was introduced into the right femoral artery after progressively dilating over an Amplatz superstiff wire. An AL-2 catheter was used to direct a straight-tip exchange length wire across the native aortic valve into  the left ventricle. This was exchanged out for a pigtail catheter and position was confirmed in the LV apex. Simultaneous LV and Ao pressures were recorded.  The pigtail catheter was exchanged for an Amplatz Extra-stiff wire in the LV apex.  Echocardiography was utilized to confirm appropriate wire position and no sign of entanglement in the mitral subvalvular apparatus.   BALLOON AORTIC VALVULOPLASTY:   Balloon aortic valvuloplasty was performed using a 23 mm valvuloplasty balloon.  Once optimal position was achieved, BAV was done under rapid ventricular pacing. The patient recovered well hemodynamically.    TRANSCATHETER HEART VALVE DEPLOYMENT:   An Edwards Sapien 3 transcatheter heart valve (size 26 mm, model #9600TFX, serial #[id number]) was prepared and crimped per manufacturer's guidelines, and the proper orientation of the valve is confirmed on the [employer] delivery system. The valve was advanced through the introducer sheath using normal technique until in an appropriate position in the abdominal aorta beyond the sheath tip. The balloon was then retracted and using the fine-tuning wheel was centered on the valve. The valve was then advanced across the aortic arch using appropriate flexion of the catheter. The valve was carefully positioned across the aortic valve annulus. The Commander catheter was retracted using normal technique. Once final position of the valve has been confirmed by angiographic assessment, the valve is deployed while temporarily holding ventilation and during rapid ventricular pacing to maintain systolic blood pressure < 50 mmHg and pulse pressure < 10 mmHg. The balloon inflation is held for >3 seconds after reaching full deployment volume. Once the balloon has fully deflated the balloon is retracted into the ascending aorta and valve function is assessed using echocardiography. There is felt to be no paravalvular leak and no central aortic insufficiency.  The patient's  hemodynamic recovery following valve deployment is good.  The deployment balloon and guidewire are both removed. Echo demostrated acceptable post-procedural gradients, stable mitral valve function, and no aortic insufficiency.    PROCEDURE COMPLETION:  The sheath was removed and femoral artery closure performed by Dr Burt Knack. Please see his separate report for details.   Protamine was administered once femoral arterial repair was complete. The temporary pacemaker, pigtail catheters and femoral sheaths were removed with manual pressure used for hemostasis.   The patient tolerated the procedure well and is transported to the surgical intensive care in stable condition. There were no immediate intraoperative complications. All sponge instrument and needle counts are verified correct at completion of the operation.   No blood products were administered during the operation.  The patient received a total of 60.8 mL of intravenous contrast during the procedure.   Rexene Alberts, MD 12/05/2016 9:49 AM

## 2016-12-05 NOTE — Transfer of Care (Signed)
Immediate Anesthesia Transfer of Care Note  Patient: Brett [name]  Procedure(s) Performed: Procedure(s): TRANSCATHETER AORTIC VALVE REPLACEMENT, TRANSFEMORAL (N/A) TRANSESOPHAGEAL ECHOCARDIOGRAM (TEE) (N/A)  Patient Location: SICU  Anesthesia Type:General  Level of Consciousness: awake, alert  and oriented  Airway & Oxygen Therapy: Patient Spontanous Breathing and Patient connected to nasal cannula oxygen  Post-op Assessment: Report given to RN, Post -op Vital signs reviewed and stable and Patient moving all extremities X 4  Post vital signs: Reviewed and stable  Last Vitals:  Vitals:   12/05/16 0934 12/05/16 0939  BP:    Pulse: 61 70  Resp:    Temp:      Last Pain:  Vitals:   12/05/16 0611  TempSrc: Oral         Complications: No apparent anesthesia complications

## 2016-12-05 NOTE — Op Note (Signed)
  [facility]   MULTIDISCIPLINARY HEART VALVE TEAM   TAVR OPERATIVE NOTE   Date of Procedure:  12/05/2016  Preoperative Diagnosis: Severe Aortic Stenosis   Postoperative Diagnosis: Same   Procedure:   Transcatheter Aortic Valve Replacement - Percutaneous Transfemoral Approach  Edwards Sapien 3 THV (size 26 mm, model # 9600TFX, serial # [id number])   Co-Surgeons:  Valentina Gu. [name], MD and Sherren Mocha, MD  Anesthesiologist:  Laurie Panda, MD  Echocardiographer:  Ena Dawley, MD  Pre-operative Echo Findings:  Severe aortic stenosis  Normal/vigorous left ventricular systolic function  Post-operative Echo Findings:  No paravalvular leak  Normal left ventricular systolic function  Please see the full note of Dr [name] for indications for surgery and complete operative details.  PERIPHERAL ACCESS:    Using the modified Seldinger technique, femoral arterial and venous access was obtained with placement of 6 Fr sheaths on the left side.  A pigtail diagnostic catheter was passed through the left femoral arterial sheath under fluoroscopic guidance into the aortic root.  A JR4 catheter is used to direct a versicore wire into the abdominal aorta with caution taken in the context of the patient known chronic distal aortic dissection. A temporary transvenous pacemaker catheter was passed through the left femoral venous sheath under fluoroscopic guidance into the right ventricle.  The pacemaker was tested to ensure stable lead placement and pacemaker capture. Aortic root angiography was performed in order to determine the optimal angiographic angle for valve deployment.   TRANSFEMORAL ACCESS:  A micropuncture technique is used to access the right femoral artery under fluoroscopic guidance.  2 Perclose devices are deployed at 10' and 2' positions to 'PreClose' the femoral artery. An 8 French sheath is placed and then an Amplatz Superstiff wire is advanced through the sheath. A  14 Fr E-sheath dilator is advanced but meets significant resistance in the common iliac artery. The Superstiff wire is changed out for a Lunderquist wire and the iliac is dilated with a 14 Fr followed by a 16 Fr dilator. The 14 Fr E-sheath is then advanced into the abdominal aorta without much difficulty.   An AL-2 catheter was used to direct a straight-tip exchange length wire across the native aortic valve into the left ventricle. This was exchanged out for a pigtail catheter and position was confirmed in the LV apex. Simultaneous LV and Ao pressures were recorded.  The pigtail catheter was exchanged for an Amplatz Extra-stiff wire in the LV apex.  Echocardiography was utilized to confirm appropriate wire position and no sign of entanglement in the mitral subvalvular apparatus.  PROCEDURE COMPLETION:  The sheath was removed and femoral artery closure is performed using the 2 previously deployed Perclose devices.  Protamine was administered once femoral arterial repair was complete. The temporary pacemaker, pigtail catheters and femoral sheaths were removed with manual pressure used for hemostasis.   The patient tolerated the procedure well and is transported to the surgical intensive care in stable condition. There were no immediate intraoperative complications. All sponge instrument and needle counts are verified correct at completion of the operation.   The patient received a total of 60 mL of intravenous contrast during the procedure.   Sherren Mocha, MD 12/05/2016 11:55 AM

## 2016-12-05 NOTE — H&P (View-Only) (Signed)
Cardiology Office Note Date:  11/18/2016   ID:  Brett [name], DOB June 16, 1941, MRN [id number]  PCP:  Garret Reddish, MD  Cardiologist:  Sherren Mocha, MD    No chief complaint on file.    History of Present Illness: Brett [name] is a 76 y.o. male who presents for follow-up of aortic valve stenosis. I have followed him for several years for CAD after he underwent CABG by Dr [name] in 2006 (LIMA-LAD, SVG-D2, SVG-OM3, sequential SVG-OM4/PDA). Over recent years he has developed progressive aortic stenosis, with peak and mean transvalvular gradients in April 2017 of 71 and 42 mmHg, respectively. The patient reported no symptoms at the time. A treadmill study was performed where he exercised 5 min 42 sec according to the Bruce Protocol without any high risk features. The patient underwent a repeat echo study 11/07/16 demonstrating progressive AS with peak and mean gradients now 131 and 75 mmHg.   He is here with his wife today. 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 He initially denies chest pain, shortness of breath, lightheadedness, or other cardiac-related symptoms. However, his wife reports the patient has significant dyspnea with walking up an incline or any significant distance. Also reports his worsening fatigue.   He has been followed at the [facility] (Dr Marin Olp) for leukocytosis and iron deficiency anemia. He has received iron infusions. The patient continues to smoke cigarettes.   Past Medical History:  Diagnosis Date  . CAD (coronary artery disease) of artery bypass graft   . Carotid stenosis   . Elevated PSA   . 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 Hyperglycemia   . Hyperlipidemia   . Hyperplastic colon polyp 11/24/2009   x7  . Hypertension   . Iron deficiency anemia due to chronic blood loss 08/15/2016  . Leukocytosis 08/02/2016  . 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  . PVD (peripheral vascular disease) ([place])     Past Surgical  History:  Procedure Laterality Date  . CORONARY ARTERY BYPASS GRAFT  2006    Current Outpatient Prescriptions  Medication Sig Dispense Refill  . alfuzosin (UROXATRAL) 10 MG 24 hr tablet Take 10 mg by mouth at bedtime.  99  . Ascorbic Acid (VITAMIN C) 500 MG tablet Take 500 mg by mouth daily.      Marland Kitchen atorvastatin (LIPITOR) 40 MG tablet Take 1 tablet (40 mg total) by mouth daily. 90 tablet 3  . Calcium Carbonate-Vitamin D (CALTRATE 600+D) 600-400 MG-UNIT per tablet Take 1 tablet by mouth daily.      . cilostazol (PLETAL) 100 MG tablet Take 1 tablet (100 mg total) by mouth 2 (two) times daily. 180 tablet 3  . finasteride (PROSCAR) 5 MG tablet Take 5 mg by mouth daily.  3  . niacin (NIASPAN) 1000 MG CR tablet Take 1,000 mg by mouth at bedtime.    Marland Kitchen acetaminophen (TYLENOL) 500 MG tablet Take 1,000 mg by mouth 3 (three) times daily.    . Inositol Niacinate (NIACIN FLUSH FREE) 500 MG CAPS Take 500 mg by mouth at bedtime.    . Multiple Vitamin (MULTIVITAMIN WITH MINERALS) TABS tablet Take 1 tablet by mouth daily.     No current facility-administered medications for this visit.     Allergies:   Patient has no known allergies.   Social History:  The patient  reports that he has quit smoking. His smoking use included Cigarettes. He has a 40.00 pack-year smoking history. He quit smokeless tobacco use about a year ago.  He reports that he drinks alcohol. He reports that he does not use drugs.   Family History:  The patient's  family history includes Dementia in his mother; Heart attack in his father.    ROS:  Please see the history of present illness.  Otherwise, review of systems is positive for hearing loss, cough.  All other systems are reviewed and negative.    PHYSICAL EXAM: VS:  BP 124/62   Pulse 85   Ht 5' 6.75" (1.695 m)   Wt 136 lb (61.7 kg)   SpO2 95%   BMI 21.46 kg/m  , BMI Body mass index is 21.46 kg/m. GEN: thin, elderly male, in no acute distress  HEENT: normal  Neck: no JVD,  no masses. bilateral carotid bruits Cardiac: tachycardic and regular with 68/0 harsh systolic murmur at the RUSB Respiratory:  clear to auscultation bilaterally, normal work of breathing GI: soft, nontender, nondistended, + BS MS: no deformity or atrophy  Ext: no pretibial edema Skin: warm and dry, no rash Neuro:  Strength and sensation are intact Psych: euthymic mood, full affect  EKG:  EKG is ordered today. The ekg ordered today shows sinus tach 101 bpm, voltage criteria for LVH  Recent Labs: 06/08/2016: ALT 18 09/21/2016: HGB 12.5 11/16/2016: BUN 16; Creatinine, Ser 0.90; Platelets 221; Potassium 4.3; Sodium 143   Lipid Panel     Component Value Date/Time   CHOL 128 03/17/2016 0756   TRIG 80.0 03/17/2016 0756   TRIG 93 06/25/2006 0901   HDL 52.20 03/17/2016 0756   CHOLHDL 2 03/17/2016 0756   VLDL 16.0 03/17/2016 0756   LDLCALC 60 03/17/2016 0756      Wt Readings from Last 3 Encounters:  11/16/16 136 lb (61.7 kg)  09/21/16 135 lb (61.2 kg)  08/30/16 134 lb 1.9 oz (60.8 kg)     Cardiac Studies Reviewed: 2D echo: Study Conclusions  - Left ventricle: The cavity size was normal. There was severe   concentric hypertrophy. Systolic function was vigorous. The   estimated ejection fraction was in the range of 65% to 70%. Wall   motion was normal; there were no regional wall motion   abnormalities. Doppler parameters are consistent with abnormal   left ventricular relaxation (grade 1 diastolic dysfunction).   Doppler parameters are consistent with elevated ventricular   end-diastolic filling pressure. - Aortic valve: Trileaflet; severely thickened, severely calcified   leaflets. Valve mobility was restricted. There was critical   stenosis. Mean gradient (S): 75 mm Hg. Peak gradient (S): 131 mm   Hg. - Aortic root: The aortic root was normal in size. - Mitral valve: There was mild regurgitation. - Left atrium: The atrium was mildly dilated. - Right ventricle: The cavity  size was normal. Wall thickness was   normal. Systolic function was normal. - Right atrium: The atrium was normal in size. - Tricuspid valve: There was mild regurgitation. - Pulmonic valve: There was trivial regurgitation. - Pulmonary arteries: Systolic pressure was within the normal   range. - Inferior vena cava: The vessel was normal in size. - Pericardium, extracardiac: There was no pericardial effusion.  Impressions:  - Critical aortic stenosis, mean aortic stenosis 75 mmHg.  ASSESSMENT AND PLAN: 1.  Severe Stage D aortic stenosis with NYHA functional class II sx's of chronic diastolic heart failure 2. CAD s/p CABG without angina 3. Iron deficiency anemia 4. Tobacco 5. Hyperlipidemia  I have reviewed the natural history of aortic stenosis with the patient and his wife who is present  today. The patient clearly has symptomatic aortic stenosis with exertional dyspena and fatigue. We have discussed the limitations of medical therapy and the poor prognosis associated with symptomatic aortic stenosis. We have reviewed potential treatment options, including palliative medical therapy, conventional surgical aortic valve replacement, and transcatheter aortic valve replacement. We discussed treatment options in the context of this patient's specific comorbid medical conditions.   Considering his previous cardiac surgery, TAVR might be a reasonable treatment option. He understands the need for further evaluation with right/left heart catheterization and CT studies. He will then undergo formal cardiac surgical evaluation by Dr [name].   I have reviewed the risks, indications, and alternatives to cardiac catheterization, possible angioplasty, and stenting with the patient. Risks include but are not limited to bleeding, infection, vascular injury, stroke, myocardial infection, arrhythmia, kidney injury, radiation-related injury in the case of prolonged fluoroscopy use, emergency cardiac surgery, and  death. The patient understands the risks of serious complication is 1-2 in 7342 with diagnostic cardiac cath and 1-2% or less with angioplasty/stenting.   Current medicines are reviewed with the patient today.  The patient does not have concerns regarding medicines.  Labs/ tests ordered today include:   Orders Placed This Encounter  Procedures  . CT ANGIO CHEST AORTA W/CM &/OR WO/CM  . CT Angio Abd/Pel w/ and/or w/o  . CT CORONARY MORPH W/CTA COR W/SCORE W/CA W/CM &/OR WO/CM  . CT CORONARY FRACTIONAL FLOW RESERVE DATA PREP  . Basic Metabolic Panel (BMET)  . CBC  . INR/PT  . EKG 12-Lead    Disposition:   FU pending test results  Signed, Sherren Mocha, MD  11/18/2016 11:44 PM    [facility] [place], [place], [place]  [address] Phone: [phone]; Fax: [phone]

## 2016-12-06 ENCOUNTER — Inpatient Hospital Stay (HOSPITAL_COMMUNITY): Payer: Medicare Other

## 2016-12-06 ENCOUNTER — Encounter (HOSPITAL_COMMUNITY): Payer: Self-pay | Admitting: Cardiovascular Disease

## 2016-12-06 ENCOUNTER — Other Ambulatory Visit: Payer: Self-pay

## 2016-12-06 DIAGNOSIS — I350 Nonrheumatic aortic (valve) stenosis: Secondary | ICD-10-CM

## 2016-12-06 DIAGNOSIS — Z952 Presence of prosthetic heart valve: Secondary | ICD-10-CM

## 2016-12-06 DIAGNOSIS — Z954 Presence of other heart-valve replacement: Secondary | ICD-10-CM

## 2016-12-06 LAB — CBC
HEMATOCRIT: 33 % — AB (ref 39.0–52.0)
HEMOGLOBIN: 11.4 g/dL — AB (ref 13.0–17.0)
MCH: 33.6 pg (ref 26.0–34.0)
MCHC: 34.5 g/dL (ref 30.0–36.0)
MCV: 97.3 fL (ref 78.0–100.0)
Platelets: 169 10*3/uL (ref 150–400)
RBC: 3.39 MIL/uL — ABNORMAL LOW (ref 4.22–5.81)
RDW: 13.3 % (ref 11.5–15.5)
WBC: 9.5 10*3/uL (ref 4.0–10.5)

## 2016-12-06 LAB — BASIC METABOLIC PANEL
Anion gap: 7 (ref 5–15)
BUN: 9 mg/dL (ref 6–20)
CALCIUM: 9.2 mg/dL (ref 8.9–10.3)
CHLORIDE: 104 mmol/L (ref 101–111)
CO2: 27 mmol/L (ref 22–32)
CREATININE: 0.89 mg/dL (ref 0.61–1.24)
GFR calc Af Amer: >60 mL/min (ref >60)
GFR calc non Af Amer: >60 mL/min (ref >60)
GLUCOSE: 108 mg/dL — AB (ref 65–99)
Potassium: 3.8 mmol/L (ref 3.5–5.1)
Sodium: 138 mmol/L (ref 135–145)

## 2016-12-06 LAB — MAGNESIUM: Magnesium: 1.4 mg/dL — ABNORMAL LOW (ref 1.7–2.4)

## 2016-12-06 MED ORDER — INOSITOL NIACINATE 500 MG PO CAPS: 500.0000 mg | ORAL_CAPSULE | Freq: Every day | ORAL | Status: DC

## 2016-12-06 MED ORDER — ASPIRIN EC 325 MG PO TBEC: 325.0000 mg | DELAYED_RELEASE_TABLET | Freq: Every day | ORAL | Status: DC

## 2016-12-06 MED ORDER — SODIUM CHLORIDE 0.9 % IV SOLN
30.0000 meq | Freq: Once | INTRAVENOUS | Status: AC
  Administered 2016-12-06: 30 meq via INTRAVENOUS
  Filled 2016-12-06: qty 15

## 2016-12-06 MED ORDER — SODIUM CHLORIDE 0.9% FLUSH
3.0000 mL | Freq: Two times a day (BID) | INTRAVENOUS | Status: DC
  Administered 2016-12-06 (×2): 3 mL via INTRAVENOUS

## 2016-12-06 MED ORDER — MOVING RIGHT ALONG BOOK
Freq: Once | Status: DC
  Filled 2016-12-06: qty 1

## 2016-12-06 MED ORDER — ATORVASTATIN CALCIUM 40 MG PO TABS
40.0000 mg | ORAL_TABLET | Freq: Every day | ORAL | Status: DC
Start: 2016-12-06 — End: 2016-12-07
  Administered 2016-12-06: 40 mg via ORAL
  Filled 2016-12-06: qty 1

## 2016-12-06 MED ORDER — SODIUM CHLORIDE 0.9 % IV SOLN: 250.0000 mL | INTRAVENOUS | Status: DC | PRN

## 2016-12-06 MED ORDER — SODIUM CHLORIDE 0.9% FLUSH: 3.0000 mL | INTRAVENOUS | Status: DC | PRN

## 2016-12-06 MED ORDER — METOPROLOL TARTRATE 25 MG PO TABS
25.0000 mg | ORAL_TABLET | Freq: Two times a day (BID) | ORAL | Status: DC
  Administered 2016-12-06 – 2016-12-07 (×2): 25 mg via ORAL
  Filled 2016-12-06 (×2): qty 1

## 2016-12-06 MED ORDER — NIACIN ER (ANTIHYPERLIPIDEMIC) 500 MG PO TBCR
1000.0000 mg | EXTENDED_RELEASE_TABLET | Freq: Every day | ORAL | Status: DC
  Administered 2016-12-06: 1000 mg via ORAL
  Filled 2016-12-06: qty 2

## 2016-12-06 MED ORDER — ASPIRIN EC 81 MG PO TBEC
81.0000 mg | DELAYED_RELEASE_TABLET | Freq: Every day | ORAL | Status: DC
  Administered 2016-12-07: 81 mg via ORAL
  Filled 2016-12-06: qty 1

## 2016-12-06 MED ORDER — ADULT MULTIVITAMIN W/MINERALS CH
1.0000 | ORAL_TABLET | Freq: Every day | ORAL | Status: DC
  Administered 2016-12-06 – 2016-12-07 (×2): 1 via ORAL
  Filled 2016-12-06 (×2): qty 1

## 2016-12-06 MED FILL — Potassium Chloride Inj 2 mEq/ML: INTRAVENOUS | Qty: 40 | Status: AC

## 2016-12-06 MED FILL — Magnesium Sulfate Inj 50%: INTRAMUSCULAR | Qty: 10 | Status: AC

## 2016-12-06 MED FILL — Heparin Sodium (Porcine) Inj 1000 Unit/ML: INTRAMUSCULAR | Qty: 30 | Status: AC

## 2016-12-06 NOTE — Progress Notes (Signed)
CARDIAC REHAB PHASE I   PRE:  Rate/Rhythm: 73 SR    BP: sitting 156/64    SaO2: 94 RA  MODE:  Ambulation: 1100 ft   POST:  Rate/Rhythm: 95 SR    BP: sitting 164/58     SaO2: 100 RA  Pt moving well. Got out of bed and walked independently. Not SOB after 550 ft so decided he wanted to go again. No c/o, feels good. To bathroom then bed for a nap. Wife present. [place], ACSM 12/06/2016 2:41 PM

## 2016-12-06 NOTE — Progress Notes (Signed)
Patient transferred to 2W32 via wheelchair with family . Tolerated well and actually did a dance at bedside.

## 2016-12-06 NOTE — Progress Notes (Signed)
Report called to [facility] RN [phone]

## 2016-12-06 NOTE — Anesthesia Postprocedure Evaluation (Addendum)
Anesthesia Post Note  Patient: STANLEE ROEHRIG  Procedure(s) Performed: Procedure(s) (LRB): TRANSCATHETER AORTIC VALVE REPLACEMENT, TRANSFEMORAL (N/A) TRANSESOPHAGEAL ECHOCARDIOGRAM (TEE) (N/A)  Patient location during evaluation: ICU Anesthesia Type: General Level of consciousness: awake and alert Pain management: pain level controlled Vital Signs Assessment: post-procedure vital signs reviewed and stable Respiratory status: spontaneous breathing, nonlabored ventilation, respiratory function stable and patient connected to nasal cannula oxygen Cardiovascular status: blood pressure returned to baseline and stable Postop Assessment: no signs of nausea or vomiting Anesthetic complications: no       Last Vitals:  Vitals:   12/06/16 1000 12/06/16 1100  BP: (!) 155/63 138/62  Pulse: 83 69  Resp: (!) 21 15  Temp:      Last Pain:  Vitals:   12/06/16 0807  TempSrc: Oral                 Kattleya Kuhnert

## 2016-12-06 NOTE — Progress Notes (Signed)
Responded to consult requesting prayer. Provided spiritual/emotional support and Catholic prayer to pt and wife then in rm: lovely and cheery people. He was covered in a bright green and blue prayer shawl that his mother-in-law made him. He had hoped to go home today, but one doctor said tomorrow. They are both very impressed with and appreciative of the care pt has received here -- and specifically noticed how hard and what long hours the doctors work. Chaplain available for f/u   12/06/16 1600  Clinical Encounter Type  Visited With Patient and family together;Health care provider  Visit Type Initial;Psychological support;Spiritual support;Social support  Referral From Nurse  Spiritual Encounters  Spiritual Needs Prayer;Emotional  Stress Factors  Patient Stress Factors Health changes;Loss of control  Family Stress Factors Family relationships;Health changes;Loss of control

## 2016-12-06 NOTE — Care Management Note (Signed)
Case Management Note Marvetta Gibbons RN, BSN Unit 2W-Case Manager-- [place] coverage [phone]  Patient Details  Name: Brett [name] MRN: [id number] Date of Birth: 1940/10/07  Subjective/Objective:  Pt admitted s/p TAVR on 12/05/16                  Action/Plan: PTA Pt lived at home with wife- anticipate return home- CM to follow for d/c needs  Expected Discharge Date:                  Expected Discharge Plan:  Home/Self Care  In-House Referral:     Discharge planning Services  CM Consult  Post Acute Care Choice:    Choice offered to:     DME Arranged:    DME Agency:     HH Arranged:    [place] Agency:     Status of Service:  In process, will continue to follow  If discussed at Long Length of Stay Meetings, dates discussed:    Discharge Disposition:   Additional Comments:  Dawayne Patricia, RN 12/06/2016, 10:10 AM

## 2016-12-06 NOTE — Progress Notes (Signed)
  Echocardiogram 2D Echocardiogram has been performed.  Brett [name] 12/06/2016, 9:14 AM

## 2016-12-06 NOTE — Progress Notes (Signed)
[address]       [place] [address]             [phone]        CARDIOTHORACIC SURGERY PROGRESS NOTE   R1 Day Post-Op Procedure(s) (LRB): TRANSCATHETER AORTIC VALVE REPLACEMENT, TRANSFEMORAL (N/A) TRANSESOPHAGEAL ECHOCARDIOGRAM (TEE) (N/A)  Subjective: Looks good and feels well.  Wants to go home  Objective: Vital signs: BP Readings from Last 1 Encounters:  12/06/16 (!) 162/49   Pulse Readings from Last 1 Encounters:  12/06/16 83   Resp Readings from Last 1 Encounters:  12/06/16 16   Temp Readings from Last 1 Encounters:  12/06/16 98.3 F (36.8 C) (Oral)    Hemodynamics:    Physical Exam:  Rhythm:   sinus  Breath sounds: clear  Heart sounds:  RRR w/out murmur  Incisions:  Both groins okay  Abdomen:  Soft, non-distended, non-tender  Extremities:  Warm, well-perfused    Intake/Output from previous day: 05/01 0701 - 05/02 0700 In: 3201.7 [P.O.:240; I.V.:2346.7; IV Piggyback:565] Out: 2035 [DHRCB:6384; Blood:150] Intake/Output this shift: Total I/O In: 512.6 [P.O.:200; I.V.:36; IV Piggyback:276.6] Out: 550 [Urine:550]  Lab Results:  CBC: Recent Labs  12/05/16 1047 12/06/16 0302  WBC 9.8 9.5  HGB 10.8* 11.4*  HCT 30.8* 33.0*  PLT 159 169    BMET:  Recent Labs  12/05/16 0923 12/05/16 1044 12/06/16 0302  NA 138 140 138  K 3.9 4.1 3.8  CL 104  --  104  CO2  --   --  27  GLUCOSE 131* 129* 108*  BUN 15  --  9  CREATININE 0.80  --  0.89  CALCIUM  --   --  9.2     PT/INR:   Recent Labs  12/05/16 1047  LABPROT 15.1  INR 1.18    CBG (last 3)   Recent Labs  12/05/16 1614 12/05/16 2007  GLUCAP 125* 136*    ABG    Component Value Date/Time   PHART 7.395 12/05/2016 1048   PCO2ART 40.6 12/05/2016 1048   PO2ART 107.0 12/05/2016 1048   HCO3 25.0 12/05/2016 1048   TCO2 26 12/05/2016 1048   O2SAT 98.0 12/05/2016 1048    CXR: PORTABLE CHEST 1 VIEW  COMPARISON:  Portable chest x-ray of Dec 05, 2016  FINDINGS: The lungs are well-expanded and clear. The heart and pulmonary vascularity are normal. The mediastinum is normal in width. There is calcification in the wall of the aortic arch. Sternal wires are present and appear intact. The right internal jugular venous catheter tip projects over the midportion of the SVC.  IMPRESSION: No acute pneumonia nor CHF. Stable appearance of the right internal jugular venous catheter.   Electronically Signed   By: David  Martinique M.D.   On: 12/06/2016 07:45  EKG: NSR w/out acute ischemic changes     Assessment/Plan: S/P Procedure(s) (LRB): TRANSCATHETER AORTIC VALVE REPLACEMENT, TRANSFEMORAL (N/A) TRANSESOPHAGEAL ECHOCARDIOGRAM (TEE) (N/A)  Doing well POD1 s/p TAVR Maintaining NSR w/ stable BP although still on IV NTG for hypertension Breathing comfortably w/ O2 sats 94-98% on RA   Increase metoprolol  ASA and Plavix  Mobilize  Transfer tele bed  Routine ECHO  Tentatively plan d/c home tomorrow  I spent in excess of 15 minutes during the conduct of this hospital encounter and >50% of this time involved direct face-to-face encounter with the patient for counseling and/or coordination of their care.    Rexene Alberts, MD 12/06/2016 1:45 PM

## 2016-12-06 NOTE — Progress Notes (Signed)
Progress Note  Patient Name: JEHAD BISONO Date of Encounter: 12/06/2016  Primary Cardiologist: Burt Knack  Subjective   Feels well. No chest pain or dyspnea. Walked this am without problems.   Inpatient Medications    Scheduled Meds: . alfuzosin  10 mg Oral QHS  . [START ON 12/07/2016] aspirin EC  81 mg Oral Daily  . atorvastatin  40 mg Oral q1800  . clopidogrel  75 mg Oral Q breakfast  . finasteride  5 mg Oral Daily  . mouth rinse  15 mL Mouth Rinse BID  . metoprolol tartrate  25 mg Oral BID  . moving right along book   Does not apply Once  . multivitamin with minerals  1 tablet Oral Daily  . niacin  1,000 mg Oral QHS  . [START ON 12/07/2016] pantoprazole  40 mg Oral Daily  . sodium chloride flush  3 mL Intravenous Q12H   Continuous Infusions: . sodium chloride    . cefUROXime (ZINACEF)  IV Stopped (12/06/16 0914)  . famotidine (PEPCID) IV Stopped (12/06/16 0944)  . nitroGLYCERIN Stopped (12/06/16 1015)   PRN Meds: sodium chloride, metoprolol, morphine injection, ondansetron (ZOFRAN) IV, oxyCODONE, sodium chloride flush, traMADol   Vital Signs    Vitals:   12/06/16 0900 12/06/16 1000 12/06/16 1100 12/06/16 1219  BP: 127/90 (!) 155/63 138/62 (!) 162/49  Pulse: (!) 117 83 69 83  Resp: (!) 25 (!) 21 15 16   Temp:    98.3 F (36.8 C)  TempSrc:    Oral  SpO2: 94% 98% 97% 98%  Weight:      Height:        Intake/Output Summary (Last 24 hours) at 12/06/16 1454 Last data filed at 12/06/16 1200  Gross per 24 hour  Intake             1510 ml  Output             3120 ml  Net            -1610 ml   Filed Weights   12/05/16 0558 12/06/16 0500  Weight: 136 lb (61.7 kg) 134 lb 14.7 oz (61.2 kg)    Telemetry    Sinus rhythm, PVC's - Personally Reviewed   Physical Exam  Pleasant male in NAD GEN: No acute distress.   Neck: No JVD Cardiac: RRR, 2/6 SEM at the RUSB Respiratory: Clear to auscultation bilaterally. GI: Soft, nontender, non-distended  MS: No edema; No  deformity. BL groin sites clear Neuro:  Nonfocal  Psych: Normal affect   Labs    Chemistry Recent Labs Lab 12/01/16 1020 12/05/16 0803 12/05/16 0923 12/05/16 1044 12/06/16 0302  NA 138 139 138 140 138  K 4.0 3.8 3.9 4.1 3.8  CL 108 104 104  --  104  CO2 21*  --   --   --  27  GLUCOSE 98 112* 131* 129* 108*  BUN 17 17 15   --  9  CREATININE 0.96 0.60* 0.80  --  0.89  CALCIUM 9.5  --   --   --  9.2  PROT 6.9  --   --   --   --   ALBUMIN 4.3  --   --   --   --   AST 23  --   --   --   --   ALT 17  --   --   --   --   ALKPHOS 57  --   --   --   --  BILITOT 0.7  --   --   --   --   GFRNONAA >60  --   --   --  >60  GFRAA >60  --   --   --  >60  ANIONGAP 9  --   --   --  7     Hematology Recent Labs Lab 12/01/16 1020  12/05/16 1044 12/05/16 1047 12/06/16 0302  WBC 9.3  --   --  9.8 9.5  RBC 3.96*  --   --  3.17* 3.39*  HGB 13.3  < > 10.9* 10.8* 11.4*  HCT 38.6*  < > 32.0* 30.8* 33.0*  MCV 97.5  --   --  97.2 97.3  MCH 33.6  --   --  34.1* 33.6  MCHC 34.5  --   --  35.1 34.5  RDW 13.4  --   --  13.5 13.3  PLT 195  --   --  159 169  < > = values in this interval not displayed.  Cardiac EnzymesNo results for input(s): TROPONINI in the last 168 hours. No results for input(s): TROPIPOC in the last 168 hours.   BNPNo results for input(s): BNP, PROBNP in the last 168 hours.   DDimer No results for input(s): DDIMER in the last 168 hours.   Radiology    Dg Chest Port 1 View  Result Date: 12/06/2016 CLINICAL DATA:  Status post transcatheter aortic valve replacement EXAM: PORTABLE CHEST 1 VIEW COMPARISON:  Portable chest x-ray of Dec 05, 2016 FINDINGS: The lungs are well-expanded and clear. The heart and pulmonary vascularity are normal. The mediastinum is normal in width. There is calcification in the wall of the aortic arch. Sternal wires are present and appear intact. The right internal jugular venous catheter tip projects over the midportion of the SVC. IMPRESSION: No  acute pneumonia nor CHF. Stable appearance of the right internal jugular venous catheter. Electronically Signed   By: David  Martinique M.D.   On: 12/06/2016 07:45   Dg Chest Port 1 View  Result Date: 12/05/2016 CLINICAL DATA:  Status post transcatheter aortic valve replacement today. EXAM: PORTABLE CHEST 1 VIEW COMPARISON:  PA and lateral chest 12/01/2016. FINDINGS: Right IJ catheter is in place with the tip in the mid to lower superior vena cava. Lungs appear emphysematous but are clear. No pneumothorax. Heart size is upper normal. The patient is status post CABG. Prosthetic aortic valve is seen. IMPRESSION: No acute disease. Right IJ catheter tip projects in the mid to lower superior vena cava. Negative for pneumothorax. Emphysema. Electronically Signed   By: Inge Rise M.D.   On: 12/05/2016 11:06    Cardiac Studies   POD #1 Echo: Study Conclusions  - Left ventricle: Very small LV cavity with mid and apical   obliteration systole and small midcavitary gradient. The cavity   size was normal. Wall thickness was increased in a pattern of   moderate LVH. Systolic function was vigorous. The estimated   ejection fraction was in the range of 65% to 70%. - Aortic valve: TAVR valve not well seen No perivalvular   regurgitation low gradients. - Mitral valve: There was mild regurgitation. - Atrial septum: No defect or patent foramen ovale was identified. - Impressions: Post deployment 26 mm Sapien 3 valve with no   perivalvular regurgitation and low stable gradients   Volume appears low with mid cavity gradient  Impressions:  - Post deployment 26 mm Sapien 3 valve with no perivalvular   regurgitation and  low stable gradients   Volume appears low with mid cavity gradient  Patient Profile     76 y.o. 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 Echo reviewed with no PVL and normal gradients. Continue ASA and  plavix. Mobilize and tx tele bed per Dr [name]. Anticipate DC home tomorrow.  Deatra Kayvan, MD  12/06/2016, 2:54 PM

## 2016-12-07 ENCOUNTER — Telehealth: Payer: Self-pay | Admitting: Cardiovascular Disease

## 2016-12-07 LAB — BASIC METABOLIC PANEL
ANION GAP: 8 (ref 5–15)
BUN: 14 mg/dL (ref 6–20)
CHLORIDE: 102 mmol/L (ref 101–111)
CO2: 28 mmol/L (ref 22–32)
Calcium: 9.2 mg/dL (ref 8.9–10.3)
Creatinine, Ser: 0.97 mg/dL (ref 0.61–1.24)
Glucose, Bld: 116 mg/dL — ABNORMAL HIGH (ref 65–99)
POTASSIUM: 3.9 mmol/L (ref 3.5–5.1)
SODIUM: 138 mmol/L (ref 135–145)

## 2016-12-07 LAB — TYPE AND SCREEN
ABO/RH(D): A POS
Antibody Screen: NEGATIVE
UNIT DIVISION: 0
Unit division: 0

## 2016-12-07 LAB — CBC
HCT: 33.6 % — ABNORMAL LOW (ref 39.0–52.0)
HEMOGLOBIN: 11.6 g/dL — AB (ref 13.0–17.0)
MCH: 33.8 pg (ref 26.0–34.0)
MCHC: 34.5 g/dL (ref 30.0–36.0)
MCV: 98 fL (ref 78.0–100.0)
PLATELETS: 157 10*3/uL (ref 150–400)
RBC: 3.43 MIL/uL — AB (ref 4.22–5.81)
RDW: 13.4 % (ref 11.5–15.5)
WBC: 10.1 10*3/uL (ref 4.0–10.5)

## 2016-12-07 LAB — BPAM RBC
Blood Product Expiration Date: 201805102359
Blood Product Expiration Date: 201805102359
ISSUE DATE / TIME: 201805010734
ISSUE DATE / TIME: 201805010734
Unit Type and Rh: 6200
Unit Type and Rh: 6200

## 2016-12-07 MED ORDER — AMLODIPINE BESYLATE 5 MG PO TABS
5.0000 mg | ORAL_TABLET | Freq: Every day | ORAL | Status: DC
  Administered 2016-12-07: 5 mg via ORAL
  Filled 2016-12-07: qty 1

## 2016-12-07 MED ORDER — AMLODIPINE BESYLATE 5 MG PO TABS: 5.0000 mg | ORAL_TABLET | Freq: Every day | ORAL | 3 refills | Status: DC

## 2016-12-07 MED ORDER — ASPIRIN 81 MG PO TBEC: 81.0000 mg | DELAYED_RELEASE_TABLET | Freq: Every day | ORAL | Status: AC

## 2016-12-07 MED ORDER — CLOPIDOGREL BISULFATE 75 MG PO TABS: 75.0000 mg | ORAL_TABLET | Freq: Every day | ORAL | 1 refills | Status: DC

## 2016-12-07 MED ORDER — AMLODIPINE BESYLATE 5 MG PO TABS: 5.0000 mg | ORAL_TABLET | Freq: Every day | ORAL | 1 refills | Status: DC

## 2016-12-07 MED ORDER — METOPROLOL TARTRATE 25 MG PO TABS: 25.0000 mg | ORAL_TABLET | Freq: Two times a day (BID) | ORAL | 1 refills | Status: DC

## 2016-12-07 MED ORDER — METOPROLOL TARTRATE 25 MG PO TABS: 25.0000 mg | ORAL_TABLET | Freq: Two times a day (BID) | ORAL | 3 refills | Status: DC

## 2016-12-07 MED ORDER — CLOPIDOGREL BISULFATE 75 MG PO TABS: 75.0000 mg | ORAL_TABLET | Freq: Every day | ORAL | 3 refills | Status: DC

## 2016-12-07 MED ORDER — TRAMADOL HCL 50 MG PO TABS: 50.0000 mg | ORAL_TABLET | Freq: Two times a day (BID) | ORAL | 0 refills | Status: DC | PRN

## 2016-12-07 NOTE — Progress Notes (Signed)
Progress Note  Patient Name: Brett [name] Date of Encounter: 12/07/2016  Primary Cardiologist: Burt Knack  Subjective   No complaints this am. Eager to go home. No CP or dyspnea.   Inpatient Medications    Scheduled Meds: . alfuzosin  10 mg Oral QHS  . aspirin EC  81 mg Oral Daily  . atorvastatin  40 mg Oral q1800  . clopidogrel  75 mg Oral Q breakfast  . finasteride  5 mg Oral Daily  . mouth rinse  15 mL Mouth Rinse BID  . metoprolol tartrate  25 mg Oral BID  . moving right along book   Does not apply Once  . multivitamin with minerals  1 tablet Oral Daily  . niacin  1,000 mg Oral QHS  . pantoprazole  40 mg Oral Daily  . sodium chloride flush  3 mL Intravenous Q12H   Continuous Infusions: . sodium chloride    . cefUROXime (ZINACEF)  IV Stopped (12/06/16 2129)  . nitroGLYCERIN Stopped (12/06/16 1015)   PRN Meds: sodium chloride, metoprolol, morphine injection, ondansetron (ZOFRAN) IV, oxyCODONE, sodium chloride flush, traMADol   Vital Signs    Vitals:   12/06/16 1100 12/06/16 1219 12/06/16 2100 12/07/16 0521  BP: 138/62 (!) 162/49 (!) 149/59 (!) 154/63  Pulse: 69 83 83 83  Resp: 15 16 16 16   Temp:  98.3 F (36.8 C) 99.6 F (37.6 C) 98.7 F (37.1 C)  TempSrc:  Oral Oral Oral  SpO2: 97% 98% 97% 98%  Weight:    132 lb 1.6 oz (59.9 kg)  Height:        Intake/Output Summary (Last 24 hours) at 12/07/16 0714 Last data filed at 12/07/16 0543  Gross per 24 hour  Intake           1062.6 ml  Output              750 ml  Net            312.6 ml   Filed Weights   12/05/16 0558 12/06/16 0500 12/07/16 0521  Weight: 136 lb (61.7 kg) 134 lb 14.7 oz (61.2 kg) 132 lb 1.6 oz (59.9 kg)    Telemetry    Sinus rhythm, no significant arrhythmia - Personally Reviewed   Physical Exam  Pleasant male resting comfortably GEN: No acute distress.   Neck: No JVD. Bandage removed IJ site clear Cardiac: RRR, 2/6 SEM at the RUSB Respiratory: Clear to auscultation  bilaterally. GI: Soft, nontender, non-distended  MS: No edema; No deformity. BL groin sites clear Neuro:  Nonfocal  Psych: Normal affect   Labs    Chemistry Recent Labs Lab 12/01/16 1020  12/05/16 0923 12/05/16 1044 12/06/16 0302 12/07/16 0157  NA 138  < > 138 140 138 138  K 4.0  < > 3.9 4.1 3.8 3.9  CL 108  < > 104  --  104 102  CO2 21*  --   --   --  27 28  GLUCOSE 98  < > 131* 129* 108* 116*  BUN 17  < > 15  --  9 14  CREATININE 0.96  < > 0.80  --  0.89 0.97  CALCIUM 9.5  --   --   --  9.2 9.2  PROT 6.9  --   --   --   --   --   ALBUMIN 4.3  --   --   --   --   --   AST 23  --   --   --   --   --  ALT 17  --   --   --   --   --   ALKPHOS 57  --   --   --   --   --   BILITOT 0.7  --   --   --   --   --   GFRNONAA >60  --   --   --  >60 >60  GFRAA >60  --   --   --  >60 >60  ANIONGAP 9  --   --   --  7 8  < > = values in this interval not displayed.   Hematology  Recent Labs Lab 12/05/16 1047 12/06/16 0302 12/07/16 0157  WBC 9.8 9.5 10.1  RBC 3.17* 3.39* 3.43*  HGB 10.8* 11.4* 11.6*  HCT 30.8* 33.0* 33.6*  MCV 97.2 97.3 98.0  MCH 34.1* 33.6 33.8  MCHC 35.1 34.5 34.5  RDW 13.5 13.3 13.4  PLT 159 169 157    Cardiac EnzymesNo results for input(s): TROPONINI in the last 168 hours. No results for input(s): TROPIPOC in the last 168 hours.   BNPNo results for input(s): BNP, PROBNP in the last 168 hours.   DDimer No results for input(s): DDIMER in the last 168 hours.   Radiology    Dg Chest Port 1 View  Result Date: 12/06/2016 CLINICAL DATA:  Status post transcatheter aortic valve replacement EXAM: PORTABLE CHEST 1 VIEW COMPARISON:  Portable chest x-ray of Dec 05, 2016 FINDINGS: The lungs are well-expanded and clear. The heart and pulmonary vascularity are normal. The mediastinum is normal in width. There is calcification in the wall of the aortic arch. Sternal wires are present and appear intact. The right internal jugular venous catheter tip projects over the  midportion of the SVC. IMPRESSION: No acute pneumonia nor CHF. Stable appearance of the right internal jugular venous catheter. Electronically Signed   By: David  Martinique M.D.   On: 12/06/2016 07:45   Dg Chest Port 1 View  Result Date: 12/05/2016 CLINICAL DATA:  Status post transcatheter aortic valve replacement today. EXAM: PORTABLE CHEST 1 VIEW COMPARISON:  PA and lateral chest 12/01/2016. FINDINGS: Right IJ catheter is in place with the tip in the mid to lower superior vena cava. Lungs appear emphysematous but are clear. No pneumothorax. Heart size is upper normal. The patient is status post CABG. Prosthetic aortic valve is seen. IMPRESSION: No acute disease. Right IJ catheter tip projects in the mid to lower superior vena cava. Negative for pneumothorax. Emphysema. Electronically Signed   By: Inge Rise M.D.   On: 12/05/2016 11:06    Cardiac Studies   POD #1 Echo: Study Conclusions  - Left ventricle: Very small LV cavity with mid and apical   obliteration systole and small midcavitary gradient. The cavity   size was normal. Wall thickness was increased in a pattern of   moderate LVH. Systolic function was vigorous. The estimated   ejection fraction was in the range of 65% to 70%. - Aortic valve: TAVR valve not well seen No perivalvular   regurgitation low gradients. - Mitral valve: There was mild regurgitation. - Atrial septum: No defect or patent foramen ovale was identified. - Impressions: Post deployment 26 mm Sapien 3 valve with no   perivalvular regurgitation and low stable gradients   Volume appears low with mid cavity gradient  Impressions:  - Post deployment 26 mm Sapien 3 valve with no perivalvular   regurgitation and low stable gradients   Volume appears low with mid  cavity gradient  Patient Profile     76 y.o. 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 Severe aortic stenosis: continues to do well. stable for DC today.  Continue ASA and plavix. 30-day Valve Clinic visit arranged 5/23 with an echo same day.  2. HTN, uncontrolled. Would add amlodipine 5 mg daily. Continue metoprolol 25 mg BID.  3. Anemia, mild post-op.  4. CAD: prior CABG with patent grafts. No angina. On statin, beta-blocker, ASA  Dispo: per Dr [name]. Anticipate DC home today. FU arranged as above.   Deatra Aakash, MD  12/07/2016, 7:14 AM

## 2016-12-07 NOTE — Progress Notes (Addendum)
[address]       [place] [address]             [phone]      2 Days Post-Op Procedure(s) (LRB): TRANSCATHETER AORTIC VALVE REPLACEMENT, TRANSFEMORAL (N/A) TRANSESOPHAGEAL ECHOCARDIOGRAM (TEE) (N/A) Subjective: Feels well, wants to go home  Objective: Vital signs in last 24 hours: Temp:  [98.3 F (36.8 C)-99.6 F (37.6 C)] 98.7 F (37.1 C) (05/03 0521) Pulse Rate:  [69-117] 83 (05/03 0521) Cardiac Rhythm: Normal sinus rhythm (05/03 0736) Resp:  [15-25] 16 (05/03 0521) BP: (127-162)/(49-90) 154/63 (05/03 0521) SpO2:  [94 %-98 %] 98 % (05/03 0521) Arterial Line BP: (137-169)/(47-81) 156/47 (05/02 1000) Weight:  [132 lb 1.6 oz (59.9 kg)] 132 lb 1.6 oz (59.9 kg) (05/03 0521)  Hemodynamic parameters for last 24 hours:    Intake/Output from previous day: 05/02 0701 - 05/03 0700 In: 1062.6 [P.O.:680; I.V.:36; IV Piggyback:326.6] Out: 750 [Urine:750] Intake/Output this shift: No intake/output data recorded.  General appearance: alert, cooperative and no distress Heart: regular rate and rhythm and no murmur Lungs: clear to auscultation bilaterally Abdomen: benign Extremities: no edema Wound: groins without hematoma , minimal echymosis  Lab Results:  Recent Labs  12/06/16 0302 12/07/16 0157  WBC 9.5 10.1  HGB 11.4* 11.6*  HCT 33.0* 33.6*  PLT 169 157   BMET:  Recent Labs  12/06/16 0302 12/07/16 0157  NA 138 138  K 3.8 3.9  CL 104 102  CO2 27 28  GLUCOSE 108* 116*  BUN 9 14  CREATININE 0.89 0.97  CALCIUM 9.2 9.2    PT/INR:  Recent Labs  12/05/16 1047  LABPROT 15.1  INR 1.18   ABG    Component Value Date/Time   PHART 7.395 12/05/2016 1048   HCO3 25.0 12/05/2016 1048   TCO2 26 12/05/2016 1048   O2SAT 98.0 12/05/2016 1048   CBG (last 3)   Recent Labs  12/05/16 1614 12/05/16 2007  GLUCAP 125* 136*    Meds Scheduled Meds: . alfuzosin  10 mg Oral QHS  . amLODipine  5 mg Oral Daily  . aspirin EC  81 mg Oral  Daily  . atorvastatin  40 mg Oral q1800  . clopidogrel  75 mg Oral Q breakfast  . finasteride  5 mg Oral Daily  . mouth rinse  15 mL Mouth Rinse BID  . metoprolol tartrate  25 mg Oral BID  . moving right along book   Does not apply Once  . multivitamin with minerals  1 tablet Oral Daily  . niacin  1,000 mg Oral QHS  . pantoprazole  40 mg Oral Daily  . sodium chloride flush  3 mL Intravenous Q12H   Continuous Infusions: . sodium chloride    . cefUROXime (ZINACEF)  IV Stopped (12/06/16 2129)  . nitroGLYCERIN Stopped (12/06/16 1015)   PRN Meds:.sodium chloride, metoprolol, morphine injection, ondansetron (ZOFRAN) IV, oxyCODONE, sodium chloride flush, traMADol  Xrays Dg Chest Port 1 View  Result Date: 12/06/2016 CLINICAL DATA:  Status post transcatheter aortic valve replacement EXAM: PORTABLE CHEST 1 VIEW COMPARISON:  Portable chest x-ray of Dec 05, 2016 FINDINGS: The lungs are well-expanded and clear. The heart and pulmonary vascularity are normal. The mediastinum is normal in width. There is calcification in the wall of the aortic arch. Sternal wires are present and appear intact. The right internal jugular venous catheter tip projects over the midportion of the SVC. IMPRESSION: No acute pneumonia nor CHF. Stable appearance of the  right internal jugular venous catheter. Electronically Signed   By: David  Martinique M.D.   On: 12/06/2016 07:45   Dg Chest Port 1 View  Result Date: 12/05/2016 CLINICAL DATA:  Status post transcatheter aortic valve replacement today. EXAM: PORTABLE CHEST 1 VIEW COMPARISON:  PA and lateral chest 12/01/2016. FINDINGS: Right IJ catheter is in place with the tip in the mid to lower superior vena cava. Lungs appear emphysematous but are clear. No pneumothorax. Heart size is upper normal. The patient is status post CABG. Prosthetic aortic valve is seen. IMPRESSION: No acute disease. Right IJ catheter tip projects in the mid to lower superior vena cava. Negative for  pneumothorax. Emphysema. Electronically Signed   By: Inge Rise M.D.   On: 12/05/2016 11:06    Assessment/Plan: S/P Procedure(s) (LRB): TRANSCATHETER AORTIC VALVE REPLACEMENT, TRANSFEMORAL (N/A) TRANSESOPHAGEAL ECHOCARDIOGRAM (TEE) (N/A) Plan for discharge: see discharge orders   LOS: 2 days    GOLD,WAYNE E 12/07/2016   I have seen and examined the patient and agree with the assessment and plan as outlined.  Add Norvasc for hypertension as suggested by Dr. Burt Knack.  Rexene Alberts, MD 12/07/2016 8:52 AM

## 2016-12-07 NOTE — Progress Notes (Addendum)
Ed completed with pt and wife. Voiced understanding regarding walking guidelines. Reinforced continuing smoking cessation (quit 12/22/15). Will refer to [place] CES, ACSM 9:45 AM 12/07/2016

## 2016-12-07 NOTE — Discharge Instructions (Signed)
Endocarditis is a potentially serious infection of heart valves or inside lining of the heart.  It occurs more commonly in patients with diseased heart valves (such as patient's with aortic or mitral valve disease) and in patients who have undergone heart valve repair or replacement.  Certain surgical and dental procedures may put you at risk, such as dental cleaning, other dental procedures, or any surgery involving the respiratory, urinary, gastrointestinal tract, gallbladder or prostate gland.   To minimize your chances for develooping endocarditis, maintain good oral health and seek prompt medical attention for any infections involving the mouth, teeth, gums, skin or urinary tract.    Always notify your doctor or dentist about your underlying heart valve condition before having any invasive procedures. You will need to take antibiotics before certain procedures, including all routine dental cleanings or other dental procedures.  Your cardiologist or dentist should prescribe these antibiotics for you to be taken ahead of time.  ACTIVITY AND EXERCISE  Daily activity and exercise are an important part of your recovery. People recover at different rates depending on their general health and type of valve procedure.  Most people require six to 10 weeks to feel recovered.  No lifting, pushing, pulling more than 10 pounds (examples to avoid: groceries, vacuuming, gardening, golfing): - For one week with a procedure through the groin. - For six weeks for procedures through the chest wall. - For three months for procedures through the breast-bone.  After the initial healing process of the access site, we recommend cardiac rehabilitation for all TAVR patients. Cardiac rehabilitation will help you: - Rebuild stamina, strength and balance. - Learn how to participate in activities safely, as well as help you regain confidence to do so. - Return to activities of daily living and leisure.   Discuss attending cardiac rehabilitation at your follow-up appointment   DRIVING  Do not drive for four weeks after the date of your procedure.  If you have been told by your doctor in the past that you may not drive, you must talk with him/her before you begin driving again.  When you resume driving, you must have someone with you.  HYGIENE If you had a femoral (leg) procedure, you may take a shower when you return home. After the shower, pat the site dry. Do NOT use powder, oils or lotions in your groin area until the site has completely healed.  If you had a chest procedure, you may shower when you return home unless specifically instructed not to by your discharging practitioner. - DO NOT scrub incision; pat dry with a towel - DO NOT apply any lotions, oils, powders to the incision - No tub baths / swimming for at least six weeks.  SITE CARE  You likely will have small openings in both groins from catheters used during the procedure. If you had a transfemoral procedure, one groin will have a larger opening and may be bruised or tender.  If you had a chest procedure, you will have either a small incision in your upper sternum (breast-bone) or between your ribs on your left side. - Chest wall site: The surgical incision should be kept dry (no lotions / oils / powders) and open to air. If you experience irritation from clothing rubbing on the incision, a light gauze dressing may be applied. - Inspect your incision daily; notify your physician if there is increased redness, swelling or drainage from the incision. - If the incision is located on your breast-bone you must avoid  lifting objects heavier than a gallon of milk (eight pounds) and stretching / twisting / pulling with your arms for at least three months to ensure strong bone healing.   CONTACT [phone]-  Check your sites daily. Contact our office if you have any of the following problems: - Redness  and warmth that does not go away - Yellow or green drainage from the wound - Fever and chills - Increasing numbness in your legs - Worsening pain at the site  If you had a leg/groin procedure, it is normal to have bruising or a soft lump at the site. It is not normal if the lump suddenly becomes larger or more firm. This may mean you are bleeding. If this happens: - Lie down - Have someone press down hard, just above the hole in your skin where the procedure was performed for 15 minutes. If after holding on the site, the lump does not become larger or harder, they are performing this correctly. - If the bleeding has stopped after 15 minutes, rest and stay laying down for at least two hours. - If the bleeding continues, call 911 for an ambulance. Do NOT drive yourself or have someone else drive you.

## 2016-12-07 NOTE — Consult Note (Signed)
           [facility] CM Primary Care Navigator  12/07/2016  JHAMAL PLUCINSKI 01-07-1941 [id number]   Went to see patient in the roomearlier to identify possible discharge needs but he was alreadydischarged.  Patient was discharged home today per staff.  Attempted to call primary care provider's office to notify of patient's discharge and need for post hospital follow-up and transition of care but noted that PCP's office (per L. Owens Shark, RN) already called patient and had set-up an appointment to see Richardson Dopp, PA for follow-up on 12/13/16.  For questions, please contact:  Dannielle Huh, BSN, RN- [facility] Primary Care Navigator  Telephone: [phone] [place]

## 2016-12-07 NOTE — Discharge Summary (Signed)
Physician Discharge Summary  Patient ID: Brett [name] MRN: [id number] DOB/AGE: 76-Dec-1942 76 y.o.  Admit date: 12/05/2016 Discharge date: 12/07/2016  Admission Diagnoses:Severe aortic stenosis  Discharge Diagnoses:  Principal Problem:   S/P TAVR (transcatheter aortic valve replacement) Active Problems:   Essential hypertension   Peripheral vascular disease (HCC)   Severe aortic stenosis   Iron deficiency anemia due to chronic blood loss   Erythropoietin deficiency anemia   S/P CABG x 5  Patient Active Problem List   Diagnosis Date Noted  . 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 Iron deficiency anemia due to chronic blood loss 08/15/2016  . 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  . 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 Elevated PSA 03/17/2016  . Pulmonary nodule 06/12/2014  . Severe aortic stenosis 08/15/2011  . TOBACCO ABUSE 07/26/2009  . CAROTID STENOSIS 07/26/2009  . Peripheral vascular disease ([place]) 07/26/2009  . Hyperlipidemia 11/22/2007  . Essential hypertension 02/05/2007  . 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     History of Present Illness: Brett [name] is a 76 y.o. male who presents for follow-up of aortic valve stenosis. I have followed him for several years for CAD after he underwent CABG by Dr [name] in 2006 (LIMA-LAD, SVG-D2, SVG-OM3, sequential SVG-OM4/PDA). Over recent years he has developed progressive aortic stenosis, with peak and mean transvalvular gradients in April 2017 of 71 and 42 mmHg, respectively. The patient reported no symptoms at the time. A treadmill study was performed where he exercised 5 min 42 sec according to the Bruce Protocol without any high risk features. The patient underwent a repeat echo study 11/07/16 demonstrating progressive AS with peak and mean gradients now 131 and 75 mmHg.   He is here with his wife today. 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 He  initially denies chest pain, shortness of breath, lightheadedness, or other cardiac-related symptoms. However, his wife reports the patient has significant dyspnea with walking up an incline or any significant distance. Also reports his worsening fatigue.   He has been followed at the [facility] (Dr Marin Olp) for leukocytosis and iron deficiency anemia. He has received iron infusions. The patient continues to smoke cigarettes.   The patient was admitted electively for TAVR  Discharged Condition: good  Hospital Course: The patient was admitted electively for TAVR. On 12/05/2016 he was taken to the operating room where he underwent the below described procedure. He tolerated well was taken to the surgical intensive care unit in stable condition.  Post operative Hospital course: The patient has done quite well. He is maintained stable hemodynamics and normal sinus rhythm. He is neurologically intact. He has been started on beta blocker as well as aspirin and Plavix. The postoperative echocardiogram reveals no paravalvular regurgitation and low stable gradients. He is tolerating gradually increasing activities using standard protocols. His groin sites are unremarkable without hematoma. At time of discharge he is felt to be quite stable. He has been started on Norvasc for some postoperative hypertension. Consults: cardiology  Significant Diagnostic Studies: post-op echo  Treatments: surgery:   TAVR OPERATIVE NOTE   Date of Procedure:                12/05/2016  Preoperative Diagnosis:      Severe Aortic Stenosis   Postoperative Diagnosis:    Same   Procedure:       Transcatheter Aortic Valve Replacement - Percutaneous Transfemoral Approach  Edwards Sapien 3 THV (size 26 mm, model # 9600TFX, serial # [id number])              Co-Surgeons:                        Valentina Gu. [name], MD and Sherren Mocha, MD  Anesthesiologist:                  Laurie Panda,  MD  Echocardiographer:              Ena Dawley, MD  Pre-operative Echo Findings: ? Severe aortic stenosis ? Normal/vigorous left ventricular systolic function  Post-operative Echo Findings: ? No paravalvular leak ? Normal left ventricular systolic function    TAVR OPERATIVE NOTE   Date of Procedure:                12/05/2016  Preoperative Diagnosis:      Severe Aortic Stenosis   Postoperative Diagnosis:    Same   Procedure:        Transcatheter Aortic Valve Replacement - Percutaneous Right Transfemoral Approach             Edwards Sapien 3 THV (size 26 mm, model # 9600TFX, serial # [id number])              Co-Surgeons:                        Valentina Gu. [name], MD and Sherren Mocha, MD  Anesthesiologist:                  Laurie Panda, MD  Echocardiographer:              Ena Dawley, MD  Pre-operative Echo Findings: ? Severe aortic stenosis ? Normal left ventricular systolic function  Post-operative Echo Findings: ? No paravalvular leak ? Normal left ventricular systolic function     Discharge Exam: Blood pressure (!) 154/63, pulse 83, temperature 98.7 F (37.1 C), temperature source Oral, resp. rate 16, height 5' 6.75" (1.695 m), weight 132 lb 1.6 oz (59.9 kg), SpO2 98 %.  General appearance: alert, cooperative and no distress Heart: regular rate and rhythm and no murmur Lungs: clear to auscultation bilaterally Abdomen: benign Extremities: no edema Wound: groins without hematoma , minimal echymosis  Disposition: 01-Home or Self Care  Discharge Instructions    Discharge patient    Complete by:  As directed    Discharge disposition:  01-Home or Self Care   Discharge patient date:  12/07/2016     Allergies as of 12/07/2016      Reactions   No Known Allergies       Medication List    STOP taking these medications   cilostazol 100 MG tablet Commonly known as:  PLETAL   ciprofloxacin 500 MG tablet Commonly known as:  CIPRO      TAKE these medications   acetaminophen 500 MG tablet Commonly known as:  TYLENOL Take 1,000 mg by mouth 3 (three) times daily.   alfuzosin 10 MG 24 hr tablet Commonly known as:  UROXATRAL Take 10 mg by mouth at bedtime.   amLODipine 5 MG tablet Commonly known as:  NORVASC Take 1 tablet (5 mg total) by mouth daily.   aspirin 81 MG EC tablet Take 1 tablet (81 mg total) by mouth daily.   atorvastatin 40 MG tablet Commonly known as:  LIPITOR Take 1 tablet (40 mg total) by mouth daily.  CALTRATE 600+D 600-400 MG-UNIT tablet Generic drug:  Calcium Carbonate-Vitamin D Take 1 tablet by mouth daily.   clopidogrel 75 MG tablet Commonly known as:  PLAVIX Take 1 tablet (75 mg total) by mouth daily with breakfast.   finasteride 5 MG tablet Commonly known as:  PROSCAR Take 5 mg by mouth daily.   metoprolol tartrate 25 MG tablet Commonly known as:  LOPRESSOR Take 1 tablet (25 mg total) by mouth 2 (two) times daily.   multivitamin with minerals Tabs tablet Take 1 tablet by mouth daily.   niacin 1000 MG CR tablet Commonly known as:  NIASPAN Take 1,000 mg by mouth at bedtime.   NIACIN FLUSH FREE 500 MG Caps Generic drug:  Inositol Niacinate Take 500 mg by mouth at bedtime.   traMADol 50 MG tablet Commonly known as:  ULTRAM Take 1-2 tablets (50-100 mg total) by mouth every 12 (twelve) hours as needed for moderate pain.   vitamin C 500 MG tablet Commonly known as:  ASCORBIC ACID Take 500 mg by mouth daily.      Follow-up Information    Sherren Mocha, MD Follow up.   Specialty:  Cardiology Why:  see discharge paperwork Contact information: [address] [place] [address] [phone]           Signed: John [name] 12/07/2016, 8:17 AM

## 2016-12-07 NOTE — Telephone Encounter (Signed)
New Message    Pt daughter calling about pt Rx - stated you would know what she is talking about. Requests a call back.

## 2016-12-07 NOTE — Telephone Encounter (Signed)
I spoke with the pt's wife and scheduled post-hospital follow up next week with Richardson Dopp PA-C. She asked if the pt is able to drive and I made her aware that he cannot at this time and once he is seen 12/13/16 then he can be cleared to drive if doing okay.  She also requested mail order prescriptions for amlodipine, plavix and metoprolol tartrate.  Rx sent to pharmacy. She will contact the office with any additional questions or concerns.

## 2016-12-11 ENCOUNTER — Telehealth (HOSPITAL_COMMUNITY): Payer: Self-pay | Admitting: Family Medicine

## 2016-12-11 NOTE — Telephone Encounter (Signed)
Verified Medicare A/B & [place] benefits through [employer]port. & [place] with [place] Reference [phone] & [phone]... KJ

## 2016-12-12 DIAGNOSIS — I2510 Atherosclerotic heart disease of native coronary artery without angina pectoris: Secondary | ICD-10-CM | POA: Insufficient documentation

## 2016-12-12 NOTE — Progress Notes (Signed)
Cardiology Office Note:    Date:  12/13/2016   ID:  Brett [name], DOB 21-Sep-1940, MRN [id number]  PCP:  Marin Olp, MD  Cardiologist:  Dr. Sherren Mocha   Urologist: Dr. Alyson Ingles  Referring MD: Marin Olp, MD   Chief Complaint  Patient presents with  . Hospitalization Follow-up    Status post TAVR    History of Present Illness:    Brett [name] is a 76 y.o. male with a hx of CAD status post CABG in 2006, aortic stenosis, HTN, HL, leukocytosis, iron deficiency anemia. He was recently noted to have progressive, symptomatic, severe stage D aortic stenosis. He was evaluated by Dr. Burt Knack and Dr. [name] for TAVR. Cardiac catheterization demonstrated patent bypass grafts. He was ultimately admitted 5/1-5/3 and underwent TAVR via transfemoral approach. Postoperative echo is stable. There was no perivalvular regurgitation. He remained in normal sinus rhythm postoperatively.   He returns for post hospital follow-up. He is here today with his wife. Since discharge, he has been doing well. He denies chest pain, shortness of breath, syncope, orthopnea, PND or edema. He does note bruising in his left groin. He denies any pain. He has noted frequent urination at night. He was treated for a UTI prior to his surgery.  Prior CV studies:   The following studies were reviewed today:  Echo 12/06/16 Moderate LVH, EF 65-70, status post TAVR, no perivalvular regurgitation  LHC 11/21/16 LAD proximal 75, D1 ostial 100 LCx proximal 95 RCA ostial 100 SVG-RPDA patent Free LIMA-distal LAD patent SVG-OM2/OM3 patent SVG-D1 patent  Echo 11/07/16 Vigorous LVF, EF 65-70, normal wall motion, grade 1 diastolic dysfunction, severe aortic stenosis (mean 75, peak 131), mild MR, mild LAE, mild TR, trivial PI  Carotid US 4/18 Bilateral ICA 1-39-follow-up 1 year  ETT 6/17 No ST deviation during stress, hypertensive blood pressure response  Past Medical History:  Diagnosis Date  . Carotid stenosis    . Elevated PSA   . 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 Hyperglycemia   . Hyperlipidemia   . Hyperplastic colon polyp 11/24/2009   x7  . Hypertension   . Iron deficiency anemia due to chronic blood loss 08/15/2016  . Leukocytosis 08/02/2016  . 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  . PVD (peripheral vascular disease) ([place])   . 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 CORONARY ARTERY BYPASS GRAFT  12/07/2004   CABG x5 Dr [name]  . RIGHT HEART CATH AND CORONARY/GRAFT ANGIOGRAPHY N/A 11/21/2016   Procedure: Right Heart Cath and Coronary/Graft Angiography;  Surgeon: Sherren Mocha, MD;  Location: [place] [facility];  Service: Cardiovascular;  Laterality: N/A;  . TEE WITHOUT CARDIOVERSION N/A 12/05/2016   Procedure: TRANSESOPHAGEAL ECHOCARDIOGRAM (TEE);  Surgeon: Sherren Mocha, MD;  Location: [place];  Service: Open Heart Surgery;  Laterality: N/A;  . TONSILLECTOMY    . TRANSCATHETER AORTIC VALVE REPLACEMENT, TRANSFEMORAL N/A 12/05/2016   Procedure: TRANSCATHETER AORTIC VALVE REPLACEMENT, TRANSFEMORAL;  Surgeon: Sherren Mocha, MD;  Location: [place];  Service: Open Heart Surgery;  Laterality: N/A;    Current Medications: Current Meds  Medication Sig  . acetaminophen (TYLENOL) 500 MG tablet Take 1,000 mg by mouth 3 (three) times  daily.  . alfuzosin (UROXATRAL) 10 MG 24 hr tablet Take 10 mg by mouth at bedtime.  Marland Kitchen amLODipine (NORVASC) 5 MG tablet Take 1 tablet (5 mg total) by mouth daily.  . Ascorbic Acid (VITAMIN C) 500 MG tablet Take 500 mg by mouth daily.    Marland Kitchen aspirin EC 81 MG EC tablet Take 1 tablet (81 mg total) by mouth daily.  Marland Kitchen atorvastatin (LIPITOR) 40 MG tablet Take 1 tablet (40  mg total) by mouth daily.  . Calcium Carbonate-Vitamin D (CALTRATE 600+D) 600-400 MG-UNIT per tablet Take 1 tablet by mouth daily.    . clopidogrel (PLAVIX) 75 MG tablet Take 1 tablet (75 mg total) by mouth daily with breakfast.  . finasteride (PROSCAR) 5 MG tablet Take 5 mg by mouth daily.  . Inositol Niacinate (NIACIN FLUSH FREE) 500 MG CAPS Take 500 mg by mouth at bedtime.  . metoprolol tartrate (LOPRESSOR) 25 MG tablet Take 1 tablet (25 mg total) by mouth 2 (two) times daily.  . Multiple Vitamin (MULTIVITAMIN WITH MINERALS) TABS tablet Take 1 tablet by mouth daily.  . niacin (NIASPAN) 1000 MG CR tablet Take 1,000 mg by mouth at bedtime.  . traMADol (ULTRAM) 50 MG tablet Take 1-2 tablets (50-100 mg total) by mouth every 12 (twelve) hours as needed for moderate pain.     Allergies:   No known allergies   Social History   Social History  . Marital status: Married    Spouse name: N/A  . Number of children: 3  . Years of education: N/A   Occupational History  . Retired    Social History Main Topics  . Smoking status: Former Smoker    Packs/day: 1.00    Years: 40.00    Types: Cigarettes    Quit date: 01/01/2016  . Smokeless tobacco: Former Systems developer    Quit date: 12/06/2015  . Alcohol use 0.0 oz/week     Comment: occasionaly  . Drug use: No  . Sexual activity: Not Asked   Other Topics Concern  . None   Social History Narrative   Married 1988 2nd marriage. 1 child from 2nd marriage (1994). 2 children from first marriage. 3-4 grandkids-broken relationship with his son though.       Retired from [employer] after 36 years in 1999. 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       Hobbies: gardening, formerly bowling and pool, family time     Family Hx: The patient's family history includes Dementia in his mother; Heart attack in his father. There is no history of Colon cancer, Colon polyps, Rectal cancer, or Stomach cancer.  ROS:   Please see the history of present  illness.    ROS All other systems reviewed and are negative.   EKGs/Labs/Other Test Reviewed:    EKG:  EKG is  ordered today.  The ekg ordered today demonstrates NSR, HR 64, normal axis, septal Q waves, QTc 394 ms, no significant changes  Recent Labs: 12/01/2016: ALT 17 12/06/2016: Magnesium 1.4 12/07/2016: BUN 14; Creatinine, Ser 0.97; Hemoglobin 11.6; Platelets 157; Potassium 3.9; Sodium 138   Recent Lipid Panel    Component Value Date/Time   CHOL 128 03/17/2016 0756   TRIG 80.0 03/17/2016 0756   TRIG 93 06/25/2006 0901   HDL 52.20 03/17/2016 0756   CHOLHDL 2 03/17/2016 0756   VLDL 16.0 03/17/2016 0756   LDLCALC 60 03/17/2016 0756     Physical Exam:    VS:  BP (!) 130/50  Pulse 64   Ht 5\' 6"  (1.676 m)   Wt 136 lb 12.8 oz (62.1 kg)   BMI 22.08 kg/m     Wt Readings from Last 3 Encounters:  12/13/16 136 lb 12.8 oz (62.1 kg)  12/07/16 132 lb 1.6 oz (59.9 kg)  12/04/16 136 lb (61.7 kg)     Physical Exam  Constitutional: He is oriented to person, place, and time. He appears well-developed and well-nourished. No distress.  HENT:  Head: Normocephalic and atraumatic.  Eyes: No scleral icterus.  Neck: Normal range of motion. No JVD present. Carotid bruit is not present.  Cardiovascular: Normal rate, regular rhythm, S1 normal and S2 normal.   No murmur heard. Pulmonary/Chest: Effort normal and breath sounds normal. He has no wheezes. He has no rhonchi. He has no rales.  Abdominal: Soft. There is no tenderness.  Musculoskeletal: He exhibits no edema.  Bilateral femoral artery sites without pain; there is a mild to moderate amount of swelling noted consistent with hematoma; there is a soft bruit over the right femoral artery  Neurological: He is alert and oriented to person, place, and time.  Skin: Skin is warm and dry.  Psychiatric: He has a normal mood and affect.    ASSESSMENT:    1. S/P TAVR (transcatheter aortic valve replacement)   2. Coronary artery disease  involving native coronary artery of native heart without angina pectoris   3. Essential hypertension   4. Frequent urination   5. Hematoma of groin, initial encounter    PLAN:    In order of problems listed above:  1. S/P TAVR (transcatheter aortic valve replacement) - Progressing well post TAVR. I cannot appreciate a murmur on exam. Limited echo postoperatively demonstrated no perivalvular regurgitation. He may return to driving. I have asked him to hold off on more extreme activities until he follows up with Dr. Burt Knack later this month.  -  Continue aspirin, Plavix  -  Follow-up with Dr. Burt Knack later this month with an echocardiogram as planned  2. Coronary artery disease involving native coronary artery of native heart without angina pectoris - Patent bypass grafts prior to TAVR. Continue aspirin, statin, beta blocker.  3. Essential hypertension - Blood pressure controlled.  4. Frequent urination - Question if his symptoms are related to BPH. I will obtain a urinalysis with culture. If this is negative, he should follow-up with urology.  5. Hematoma of groin, initial encounter - He has small bilateral hematomas. There is a soft bruit over the right. I suspect that his arteriotomy sites are well-healed. However, since he does have extensive bruising on the left and a bruit on the right, I will obtain arterial ultrasound rule out pseudoaneurysm and AV fistula.  Dispo:  Return in about 2 weeks (around 12/27/2016) for Scheduled Follow Up, w/ Dr. Burt Knack.   Medication Adjustments/Labs and Tests Ordered: Current medicines are reviewed at length with the patient today.  Concerns regarding medicines are outlined above.  Orders/Tests:  Orders Placed This Encounter  Procedures  . Urine Culture  . Urinalysis  . EKG 12-Lead   Medication changes: No orders of the defined types were placed in this encounter.  Signed, Richardson Dopp, PA-C  12/13/2016 10:01 AM    [facility] [address], [place], [place]  [address] Phone: [phone]; Fax: [phone]

## 2016-12-13 ENCOUNTER — Ambulatory Visit (HOSPITAL_COMMUNITY)
Admission: RE | Admit: 2016-12-13 | Discharge: 2016-12-13 | Disposition: A | Discharge status: Home / Self Care | Payer: Medicare Other | Source: Ambulatory Visit | Attending: Cardiology | Admitting: Cardiology

## 2016-12-13 ENCOUNTER — Telehealth: Payer: Self-pay

## 2016-12-13 ENCOUNTER — Encounter (HOSPITAL_COMMUNITY): Payer: Self-pay

## 2016-12-13 ENCOUNTER — Ambulatory Visit (INDEPENDENT_AMBULATORY_CARE_PROVIDER_SITE_OTHER): Payer: Medicare Other | Admitting: Physician Assistant

## 2016-12-13 ENCOUNTER — Encounter: Payer: Self-pay | Admitting: Physician Assistant

## 2016-12-13 VITALS — BP 130/50 | HR 64 | Ht 66.0 in | Wt 136.8 lb

## 2016-12-13 DIAGNOSIS — I739 Peripheral vascular disease, unspecified: Secondary | ICD-10-CM | POA: Insufficient documentation

## 2016-12-13 DIAGNOSIS — Z951 Presence of aortocoronary bypass graft: Secondary | ICD-10-CM | POA: Insufficient documentation

## 2016-12-13 DIAGNOSIS — R350 Frequency of micturition: Secondary | ICD-10-CM

## 2016-12-13 DIAGNOSIS — I2510 Atherosclerotic heart disease of native coronary artery without angina pectoris: Secondary | ICD-10-CM

## 2016-12-13 DIAGNOSIS — Z87891 Personal history of nicotine dependence: Secondary | ICD-10-CM | POA: Insufficient documentation

## 2016-12-13 DIAGNOSIS — I10 Essential (primary) hypertension: Secondary | ICD-10-CM | POA: Insufficient documentation

## 2016-12-13 DIAGNOSIS — E785 Hyperlipidemia, unspecified: Secondary | ICD-10-CM | POA: Diagnosis not present

## 2016-12-13 DIAGNOSIS — Z952 Presence of prosthetic heart valve: Secondary | ICD-10-CM

## 2016-12-13 DIAGNOSIS — S301XXA Contusion of abdominal wall, initial encounter: Secondary | ICD-10-CM

## 2016-12-13 DIAGNOSIS — M7981 Nontraumatic hematoma of soft tissue: Secondary | ICD-10-CM | POA: Diagnosis not present

## 2016-12-13 DIAGNOSIS — I6523 Occlusion and stenosis of bilateral carotid arteries: Secondary | ICD-10-CM

## 2016-12-13 LAB — URINALYSIS
Bilirubin, UA: NEGATIVE
GLUCOSE, UA: NEGATIVE
NITRITE UA: NEGATIVE
Specific Gravity, UA: 1.028 (ref 1.005–1.030)
Urobilinogen, Ur: 1 mg/dL (ref 0.2–1.0)
pH, UA: 5 (ref 5.0–7.5)

## 2016-12-13 MED ORDER — CIPROFLOXACIN HCL 500 MG PO TABS: 500.0000 mg | ORAL_TABLET | Freq: Two times a day (BID) | ORAL | 0 refills | Status: DC

## 2016-12-13 NOTE — Telephone Encounter (Signed)
Called patient with UA results. Per Richardson Dopp PA, UA with + white cells and + red blood cells. Infection is possible. Culture is pending. Would treat empirically. Start Cipro 500 mg Twice daily for 5 days total. Will follow up after culture resulted. Patient verbalized understanding. Medication will be sent to patient's pharmacy of choice.

## 2016-12-13 NOTE — Patient Instructions (Addendum)
Medication Instructions:  Your physician recommends that you continue on your current medications as directed. Please refer to the Current Medication list given to you today.  Labwork: Your physician recommends that you have a urinalysis and urine culture   Testing/Procedures: Your physician has requested that you have a groin pseudo duplex. During this test, exercise and ultrasound are used to evaluate arterial blood flow in the groin.  Allow one hour for this exam. There are no restrictions or special instructions.  Follow-Up: Your physician recommends that you schedule a follow-up with Dr. Burt Knack as already scheduled.  If you need a refill on your cardiac medications before your next appointment, please call your pharmacy.

## 2016-12-13 NOTE — Progress Notes (Signed)
Today's bilateral groin ultrasound is negative for pseudoaneurysm and A-V fistula.

## 2016-12-13 NOTE — Telephone Encounter (Signed)
-----   Message from Liliane Shi, [place] sent at 12/13/2016  4:49 PM EDT ----- Please call patient. UA with + white cells and + red blood cells. Infection is possible. Culture is pending.  Would treat empirically. Start Cipro 500 mg Twice daily for 5 days total. Will follow up after culture resulted. Richardson Dopp, PA-C    12/13/2016 4:48 PM

## 2016-12-15 DIAGNOSIS — R351 Nocturia: Secondary | ICD-10-CM | POA: Diagnosis not present

## 2016-12-15 DIAGNOSIS — N401 Enlarged prostate with lower urinary tract symptoms: Secondary | ICD-10-CM | POA: Diagnosis not present

## 2016-12-15 DIAGNOSIS — N200 Calculus of kidney: Secondary | ICD-10-CM | POA: Diagnosis not present

## 2016-12-15 LAB — URINE CULTURE: Organism ID, Bacteria: NO GROWTH

## 2016-12-18 ENCOUNTER — Telehealth: Payer: Self-pay | Admitting: Cardiovascular Disease

## 2016-12-18 LAB — ECHOCARDIOGRAM COMPLETE
Height: 66.75 in
Weight: 2158.74 oz

## 2016-12-18 NOTE — Telephone Encounter (Signed)
New Message     Rt legs has red dots all over it , after having the TAVR done , it goes from ankle to the thigh, no swell or itching

## 2016-12-18 NOTE — Telephone Encounter (Signed)
Agree with plan. As long as he feels well and no other symptoms will assess at his upcoming visit scheduled 5/23

## 2016-12-18 NOTE — Telephone Encounter (Signed)
I spoke with the pt's wife and she states the pt noticed red dots on his right leg from his ankle up to his thigh.  She describes these as being the size of the end of a needle. These have not worsened over the past few days and the pt does not have swelling in his leg.  The pt does not have any other rash on his body, denies itching, and denies any abnormal bleeding or bruising.  At this time time advised that the pt continue to monitor for any change in symptoms and contact the office with any further questions or concerns. Pt's wife agreed with plan.

## 2016-12-19 ENCOUNTER — Telehealth (HOSPITAL_COMMUNITY): Payer: Self-pay

## 2016-12-19 NOTE — Telephone Encounter (Signed)
Pt refused states he feels fine and feels like he is doing well on his own. Pt states that after his MD visit on 05/23 if MD request that he did need to do program he would call us back... KJ

## 2016-12-27 ENCOUNTER — Ambulatory Visit (HOSPITAL_COMMUNITY): Payer: Medicare Other | Attending: Cardiovascular Disease

## 2016-12-27 ENCOUNTER — Encounter: Payer: Self-pay | Admitting: Cardiovascular Disease

## 2016-12-27 ENCOUNTER — Ambulatory Visit (INDEPENDENT_AMBULATORY_CARE_PROVIDER_SITE_OTHER): Payer: Medicare Other | Admitting: Cardiovascular Disease

## 2016-12-27 ENCOUNTER — Other Ambulatory Visit: Payer: Self-pay

## 2016-12-27 VITALS — BP 150/60 | HR 70 | Ht 66.75 in | Wt 136.6 lb

## 2016-12-27 DIAGNOSIS — Z951 Presence of aortocoronary bypass graft: Secondary | ICD-10-CM | POA: Insufficient documentation

## 2016-12-27 DIAGNOSIS — I350 Nonrheumatic aortic (valve) stenosis: Secondary | ICD-10-CM | POA: Diagnosis not present

## 2016-12-27 DIAGNOSIS — E785 Hyperlipidemia, unspecified: Secondary | ICD-10-CM | POA: Insufficient documentation

## 2016-12-27 DIAGNOSIS — I6523 Occlusion and stenosis of bilateral carotid arteries: Secondary | ICD-10-CM | POA: Diagnosis not present

## 2016-12-27 DIAGNOSIS — I2510 Atherosclerotic heart disease of native coronary artery without angina pectoris: Secondary | ICD-10-CM | POA: Diagnosis not present

## 2016-12-27 DIAGNOSIS — I340 Nonrheumatic mitral (valve) insufficiency: Secondary | ICD-10-CM | POA: Insufficient documentation

## 2016-12-27 DIAGNOSIS — Z952 Presence of prosthetic heart valve: Secondary | ICD-10-CM

## 2016-12-27 DIAGNOSIS — I119 Hypertensive heart disease without heart failure: Secondary | ICD-10-CM | POA: Insufficient documentation

## 2016-12-27 LAB — ECHOCARDIOGRAM COMPLETE
AO mean calculated velocity dopler: 136 cm/s
AOPV: 0.38 m/s
AV Mean grad: 9 mmHg
AVCELMEANRAT: 0.47
AVPG: 19 mmHg
AVPKVEL: 217 cm/s
EERAT: 11.19
EWDT: 394 ms
FS: 27 % — AB (ref 28–44)
IV/PV OW: 1.08
LA diam index: 2.53 cm/m2
LA vol index: 37.6 mL/m2
LASIZE: 43 mm
LAVOL: 64 mL
LAVOLA4C: 66 mL
LEFT ATRIUM END SYS DIAM: 43 mm
LV E/e' medial: 11.19
LV E/e'average: 11.19
LV PW d: 11.7 mm — AB (ref 0.6–1.1)
LV TDI E'LATERAL: 9.65
LV TDI E'MEDIAL: 5.92
LV e' LATERAL: 9.65 cm/s
LVOT VTI: 22.5 cm
LVOT peak VTI: 0.49 cm
LVOT peak grad rest: 3 mmHg
LVOT peak vel: 82.7 cm/s
MV Dec: 394
MV pk A vel: 147 m/s
MV pk E vel: 108 m/s
MVPG: 5 mmHg
VTI: 45.6 cm

## 2016-12-27 MED ORDER — CLOPIDOGREL BISULFATE 75 MG PO TABS: 75.0000 mg | ORAL_TABLET | Freq: Every day | ORAL | 3 refills | Status: DC

## 2016-12-27 MED ORDER — METOPROLOL TARTRATE 25 MG PO TABS: 25.0000 mg | ORAL_TABLET | Freq: Two times a day (BID) | ORAL | 3 refills | Status: DC

## 2016-12-27 MED ORDER — AMLODIPINE BESYLATE 5 MG PO TABS: 5.0000 mg | ORAL_TABLET | Freq: Every day | ORAL | 3 refills | Status: DC

## 2016-12-27 NOTE — Progress Notes (Signed)
Cardiology Office Note Date:  12/27/2016   ID:  Brett, [name] [birth year], MRN [id number]  PCP:  Marin Olp, MD  Cardiologist:  Sherren Mocha, MD    Chief Complaint  Patient presents with  . Aortic Stenosis    severe. S/P TAVR     History of Present Illness: Brett [name] is a 76 y.o. male who presents for his 30 day TAVR follow-up visit.   The patient has been followed for coronary artery disease status post CABG. He developed progressive and severe aortic stenosis and underwent transfemoral TAVR 35/36/1443 without complication. He was treated with a 26 mm Edwards sapien 3 valve.  The patient is here alone today. He is doing well. He denies symptoms of chest pain or shortness of breath. He is eager to return to his normal activities. He is tolerating aspirin and Plavix without bleeding or bruising problems.   Past Medical History:  Diagnosis Date  . Carotid stenosis   . Elevated PSA   . 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 Hyperglycemia   . Hyperlipidemia   . Hyperplastic colon polyp 11/24/2009   x7  . Hypertension   . Iron deficiency anemia due to chronic blood loss 08/15/2016  . Leukocytosis 08/02/2016  . 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  . PVD (peripheral vascular disease) ([place])   . 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 CORONARY ARTERY BYPASS GRAFT  12/07/2004   CABG x5 Dr [name]  . RIGHT HEART CATH AND CORONARY/GRAFT ANGIOGRAPHY N/A 11/21/2016   Procedure: Right Heart Cath and Coronary/Graft Angiography;  Surgeon: Sherren Mocha, MD;  Location: [facility];  Service: Cardiovascular;  Laterality: N/A;  . TEE WITHOUT CARDIOVERSION  N/A 12/05/2016   Procedure: TRANSESOPHAGEAL ECHOCARDIOGRAM (TEE);  Surgeon: Sherren Mocha, MD;  Location: [facility];  Service: Open Heart Surgery;  Laterality: N/A;  . TONSILLECTOMY    . TRANSCATHETER AORTIC VALVE REPLACEMENT, TRANSFEMORAL N/A 12/05/2016   Procedure: TRANSCATHETER AORTIC VALVE REPLACEMENT, TRANSFEMORAL;  Surgeon: Sherren Mocha, MD;  Location: [facility];  Service: Open Heart Surgery;  Laterality: N/A;    Current Outpatient Prescriptions  Medication Sig Dispense Refill  . acetaminophen (TYLENOL) 500 MG tablet Take 1,000 mg by mouth 3 (three) times daily.    Marland Kitchen alfuzosin (UROXATRAL) 10 MG 24 hr tablet Take 10 mg by mouth at bedtime.  99  . amLODipine (NORVASC) 5 MG tablet Take 1 tablet (5 mg total) by mouth daily. 90 tablet 3  . Ascorbic Acid (VITAMIN C) 500 MG tablet Take 500 mg by mouth daily.      Marland Kitchen aspirin EC 81 MG EC tablet Take 1 tablet (81 mg total) by mouth daily.    Marland Kitchen atorvastatin (LIPITOR) 40 MG tablet Take 1 tablet (40 mg total) by mouth daily. 90 tablet 3  . Calcium Carbonate-Vitamin D (CALTRATE 600+D) 600-400 MG-UNIT per tablet Take 1 tablet by mouth daily.      . ciprofloxacin (CIPRO) 500 MG tablet Take 1 tablet (500 mg total) by mouth 2 (two) times daily. 10 tablet 0  . clopidogrel (PLAVIX) 75 MG tablet Take 1 tablet (75 mg total) by mouth daily with breakfast. 90 tablet 3  .  finasteride (PROSCAR) 5 MG tablet Take 5 mg by mouth daily.  3  . Inositol Niacinate (NIACIN FLUSH FREE) 500 MG CAPS Take 500 mg by mouth at bedtime.    . metoprolol tartrate (LOPRESSOR) 25 MG tablet Take 1 tablet (25 mg total) by mouth 2 (two) times daily. 180 tablet 3  . Multiple Vitamin (MULTIVITAMIN WITH MINERALS) TABS tablet Take 1 tablet by mouth daily.    . niacin (NIASPAN) 1000 MG CR tablet Take 1,000 mg by mouth at bedtime.    . traMADol (ULTRAM) 50 MG tablet Take 1-2 tablets (50-100 mg total) by mouth every 12 (twelve) hours as needed for moderate pain. 30 tablet 0   No current  facility-administered medications for this visit.     Allergies:   No known allergies   Social History:  The patient  reports that he quit smoking about a year ago. His smoking use included Cigarettes. He has a 40.00 pack-year smoking history. He quit smokeless tobacco use about 12 months ago. He reports that he drinks alcohol. He reports that he does not use drugs.   Family History:  The patient's  family history includes Dementia in his mother; Heart attack in his father.    ROS:  Please see the history of present illness.  All other systems are reviewed and negative.    PHYSICAL EXAM: VS:  BP (!) 150/60   Pulse 70   Ht 5' 6.75" (1.695 m)   Wt 136 lb 9.6 oz (62 kg)   BMI 21.56 kg/m  , BMI Body mass index is 21.56 kg/m. GEN: Well nourished, well developed, in no acute distress  HEENT: normal  Neck: no JVD, no masses.  Cardiac: RRR without murmur or gallop                Respiratory:  clear to auscultation bilaterally, normal work of breathing GI: soft, nontender, nondistended, + BS MS: no deformity or atrophy  Ext: no pretibial edema, pedal pulses 2+= bilaterally Skin: warm and dry, no rash Neuro:  Strength and sensation are intact Psych: euthymic mood, full affect  EKG:  EKG is not ordered today.  Recent Labs: 12/01/2016: ALT 17 12/06/2016: Magnesium 1.4 12/07/2016: BUN 14; Creatinine, Ser 0.97; Hemoglobin 11.6; Platelets 157; Potassium 3.9; Sodium 138   Lipid Panel     Component Value Date/Time   CHOL 128 03/17/2016 0756   TRIG 80.0 03/17/2016 0756   TRIG 93 06/25/2006 0901   HDL 52.20 03/17/2016 0756   CHOLHDL 2 03/17/2016 0756   VLDL 16.0 03/17/2016 0756   LDLCALC 60 03/17/2016 0756      Wt Readings from Last 3 Encounters:  12/27/16 136 lb 9.6 oz (62 kg)  12/13/16 136 lb 12.8 oz (62.1 kg)  12/07/16 132 lb 1.6 oz (59.9 kg)     Cardiac Studies Reviewed: 11-21-2016: Conclusion   1. Known severe aortic stenosis 2. Severe 3 vessel CAD with severe diffuse  proximal LAD stenosis, total occlusion of a severely calcified RCA, and severe LCx stenosis 3. S/P CABG with continued patency of all 5 bypass grafts 4. Severe calcified aortic valve with restricted opening 5. Severe calcified ascending aorta by fluoroscopy  Recommend: Continued TAVR evaluation for treatment of severe symptomatic aortic stenosis   Echo this morning is personally reviewed. Aortic valve prosthesis function is normal with peak and mean gradients of 19 and 9 mmHg, respectively. There is no paravalvular regurgitation. LV function is normal.   ASSESSMENT AND PLAN: 1.  Aortic valve disease  status post TAVR: The patient has New York Heart Association functional class I symptoms. I asked him to ease back into his normal activities but he has no restrictions at this point. His echo images are personally reviewed with a mean gradient of 9 mmHg and no paravalvular regurgitation. He should follow SBE prophylaxis. I will see him back in 6 months.  2. Coronary artery disease: Patient status post remote CABG. All of his grafts are patent. No changes in his medical regimen are made.  3. Hypertension: Patient is treated with amlodipine and metoprolol. Continue to follow.  Current medicines are reviewed with the patient today.  The patient does not have concerns regarding medicines.  Labs/ tests ordered today include:  No orders of the defined types were placed in this encounter.  Disposition:   FU 6 months  Signed, Sherren Mocha, MD  12/27/2016 5:50 PM    [facility], [place], [place]  [address] Phone: [phone]; Fax: [phone]

## 2016-12-27 NOTE — Patient Instructions (Signed)

## 2016-12-28 ENCOUNTER — Telehealth: Payer: Self-pay | Admitting: Cardiovascular Disease

## 2016-12-28 NOTE — Telephone Encounter (Signed)
New message    Pt wife is calling to let Dr. Burt Knack and Rn know that his doctor-Dr. Nolen Mu pt on myrbetriq

## 2016-12-28 NOTE — Telephone Encounter (Signed)
Patient's wife states they could not remember the name/dose of the medication that was started at Dr. Antionette Char OV yesterday. Patient started Myrbetriq 25 mg nightly.  Med list updated.   To Dr. Burt Knack for review.

## 2016-12-29 NOTE — Telephone Encounter (Signed)
Per Dr Burt Knack pt okay to take Myrbetriq.  Pt's wife aware.

## 2017-01-08 NOTE — Addendum Note (Signed)
Addendum  created 01/08/17 1436 by Oleta Mouse, MD   Sign clinical note

## 2017-01-08 NOTE — Addendum Note (Signed)
Addendum  created 01/08/17 1438 by Oleta Mouse, MD   Sign clinical note

## 2017-01-26 DIAGNOSIS — N200 Calculus of kidney: Secondary | ICD-10-CM | POA: Diagnosis not present

## 2017-01-29 ENCOUNTER — Other Ambulatory Visit: Payer: Self-pay | Admitting: Family Medicine

## 2017-01-31 ENCOUNTER — Encounter: Payer: Self-pay | Admitting: Family Medicine

## 2017-02-02 ENCOUNTER — Encounter (HOSPITAL_COMMUNITY): Payer: Self-pay

## 2017-02-02 ENCOUNTER — Ambulatory Visit (HOSPITAL_COMMUNITY): Admit: 2017-02-02 | Payer: Medicare Other | Admitting: Internal Medicine

## 2017-02-02 SURGERY — COLONOSCOPY
Anesthesia: Monitor Anesthesia Care

## 2017-02-26 ENCOUNTER — Telehealth: Payer: Self-pay | Admitting: Cardiovascular Disease

## 2017-02-26 ENCOUNTER — Encounter: Payer: Self-pay | Admitting: Cardiovascular Disease

## 2017-02-26 DIAGNOSIS — R972 Elevated prostate specific antigen [PSA]: Secondary | ICD-10-CM | POA: Diagnosis not present

## 2017-02-26 LAB — PSA: PSA: 2.97

## 2017-02-26 NOTE — Telephone Encounter (Signed)
I spoke with pt's wife. Pt is requesting letter stating he can return to work with no restrictions on 03/07/17.  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  Wife reports pt is feeling excellent.  Pt would like to pick up letter when it is ready.

## 2017-02-26 NOTE — Telephone Encounter (Signed)
Letter done

## 2017-02-26 NOTE — Telephone Encounter (Signed)
New Message  Pt wife call requesting to speak with RN. Wife states pt needs a letter stating he can return back to work 8/1 with no restrictions. Please call back to discuss

## 2017-02-27 NOTE — Telephone Encounter (Signed)
I spoke with the pt's wife and letter placed at the front desk for pick-up.

## 2017-03-05 DIAGNOSIS — N401 Enlarged prostate with lower urinary tract symptoms: Secondary | ICD-10-CM | POA: Diagnosis not present

## 2017-03-05 DIAGNOSIS — R972 Elevated prostate specific antigen [PSA]: Secondary | ICD-10-CM | POA: Diagnosis not present

## 2017-03-05 DIAGNOSIS — R351 Nocturia: Secondary | ICD-10-CM | POA: Diagnosis not present

## 2017-03-07 ENCOUNTER — Encounter: Payer: Self-pay | Admitting: Family Medicine

## 2017-03-09 ENCOUNTER — Telehealth: Payer: Self-pay | Admitting: Cardiovascular Disease

## 2017-03-09 NOTE — Telephone Encounter (Signed)
Pt's wife calling regarding letter needed for  [employer]: Diagnosis, treatment, dos, compliance, and clearance to drive from TAVR done 12-05-16-on letter head-pt requesting call when ready and will pick [phone]

## 2017-03-11 ENCOUNTER — Encounter: Payer: Self-pay | Admitting: Cardiovascular Disease

## 2017-03-11 NOTE — Telephone Encounter (Signed)
Letter done thanks

## 2017-03-12 ENCOUNTER — Telehealth: Payer: Self-pay | Admitting: Cardiovascular Disease

## 2017-03-12 NOTE — Telephone Encounter (Signed)
Voicemail left for pt's wife that letter is available for pick up at our front desk.

## 2017-03-12 NOTE — Telephone Encounter (Signed)
Patient wife returning call, wanted to let you know that she is very appreciative for the letter.

## 2017-03-12 NOTE — Telephone Encounter (Signed)
Noted  

## 2017-03-29 DIAGNOSIS — H2513 Age-related nuclear cataract, bilateral: Secondary | ICD-10-CM | POA: Diagnosis not present

## 2017-03-29 DIAGNOSIS — H524 Presbyopia: Secondary | ICD-10-CM | POA: Diagnosis not present

## 2017-04-29 DIAGNOSIS — Z23 Encounter for immunization: Secondary | ICD-10-CM | POA: Diagnosis not present

## 2017-04-30 ENCOUNTER — Telehealth: Payer: Self-pay

## 2017-04-30 NOTE — Telephone Encounter (Signed)
Spoke with wife Butch Penny who states that Dr. Arn Medal took him off the Irbesartan. Patient had a TABOR procedure done Dec 05, 2016 with Dr. Copper.

## 2017-04-30 NOTE — Telephone Encounter (Signed)
Called and spoke with patient regarding notice received from [facility] to determine if he is taking his Irbesartan or not.He stated he doesn't know as his wife manages all of his medications. He will check with her and return my phone call

## 2017-05-01 NOTE — Telephone Encounter (Signed)
Thanks for update

## 2017-05-02 ENCOUNTER — Ambulatory Visit: Payer: Medicare Other

## 2017-05-15 NOTE — Progress Notes (Signed)
PCP notes:   Health maintenance: Colonoscopy: Cancelled due to surgery.  Tdap: Will check with insurance.  Abnormal screenings: PHQ9 score 6. Pt states that he has had a lot going on with his health but he does feel like he is starting to cope better. I asked pt to schedule an office visit if he feels like he is starting to fall into depression.    Patient concerns: Pt and pts wife are concerned over pts memory. They state that he has lost direction of things and forgets conversations that just happened. MMS score of 30. Pt does participate in brain stimulating activities and continues to work. Wife states that his memory has increasingly gotten better since surgery and they will schedule an office visit if it worsens.   Nurse concerns: none.  Next PCP appt: 08/01/17.

## 2017-05-15 NOTE — Progress Notes (Signed)
Pre visit review using our clinic review tool, if applicable. No additional management support is needed unless otherwise documented below in the visit note. 

## 2017-05-15 NOTE — Progress Notes (Signed)
Subjective:   Brett [name] is a 76 y.o. male who presents for Medicare Annual/Subsequent preventive examination.  Review of Systems:  No ROS.  Medicare Wellness Visit. Additional risk factors are reflected in the social history.  Cardiac Risk Factors include: advanced age (>77men, >81 women);dyslipidemia;hypertension;male gender     Objective:    Vitals: BP (!) 128/50 (BP Location: Left Arm, Patient Position: Sitting, Cuff Size: Normal)   Pulse 70   Resp 16   Ht 5\' 6"  (1.676 m)   Wt 146 lb (66.2 kg)   SpO2 98%   BMI 23.57 kg/m   Body mass index is 23.57 kg/m.  Tobacco History  Smoking Status  . Former Smoker  . Packs/day: 1.00  . Years: 40.00  . Types: Cigarettes  . Quit date: 01/01/2016  Smokeless Tobacco  . Former Systems developer  . Quit date: 12/06/2015     Counseling given: Not Answered   Past Medical History:  Diagnosis Date  . Carotid stenosis   . Elevated PSA   . 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 Glomerulonephritis 1961  . Hyperglycemia   . Hyperlipidemia   . Hyperplastic colon polyp 11/24/2009   x7  . Hypertension   . Iron deficiency anemia due to chronic blood loss 08/15/2016  . Leukocytosis 08/02/2016  . 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  . PVD (peripheral vascular disease) ([place])   . 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 BYPASS GRAFT  2006  . CORONARY ARTERY BYPASS GRAFT  12/07/2004   CABG x5 Dr [name]  . RIGHT HEART CATH AND CORONARY/GRAFT ANGIOGRAPHY N/A 11/21/2016   Procedure: Right Heart Cath and Coronary/Graft Angiography;  Surgeon: Sherren Mocha, MD;  Location: [facility];  Service: Cardiovascular;  Laterality: N/A;  . TEE  WITHOUT CARDIOVERSION N/A 12/05/2016   Procedure: TRANSESOPHAGEAL ECHOCARDIOGRAM (TEE);  Surgeon: Sherren Mocha, MD;  Location: [facility];  Service: Open Heart Surgery;  Laterality: N/A;  . TRANSCATHETER AORTIC VALVE REPLACEMENT, TRANSFEMORAL N/A 12/05/2016   Procedure: TRANSCATHETER AORTIC VALVE REPLACEMENT, TRANSFEMORAL;  Surgeon: Sherren Mocha, MD;  Location: [facility];  Service: Open Heart Surgery;  Laterality: N/A;   Family History  Problem Relation Age of Onset  . Heart attack Father   . Dementia Mother   . Colon cancer Neg Hx   . Colon polyps Neg Hx   . Rectal cancer Neg Hx   . Stomach cancer Neg Hx    History  Sexual Activity  . Sexual activity: Not on file    Outpatient Encounter Prescriptions as of 05/16/2017  Medication Sig  . acetaminophen (TYLENOL) 500 MG tablet Take 1,000 mg by mouth daily as needed.   Marland Kitchen alfuzosin (UROXATRAL) 10 MG 24 hr tablet Take 10 mg by mouth at bedtime.  Marland Kitchen amLODipine (NORVASC) 5 MG tablet Take 1 tablet (5 mg total) by mouth daily.  . Ascorbic Acid (VITAMIN C) 500 MG tablet Take 500 mg by mouth daily.    Marland Kitchen aspirin EC 81 MG EC tablet Take 1 tablet (81 mg total) by mouth daily.  Marland Kitchen atorvastatin (LIPITOR) 40 MG tablet Take 1 tablet (40 mg total) by mouth daily.  . Calcium Carbonate-Vitamin D (CALTRATE 600+D) 600-400 MG-UNIT  per tablet Take 1 tablet by mouth daily.    Marland Kitchen docusate sodium (COLACE) 100 MG capsule Take 100 mg by mouth at bedtime.  . finasteride (PROSCAR) 5 MG tablet Take 5 mg by mouth daily.  . Inositol Niacinate (NIACIN FLUSH FREE) 500 MG CAPS Take 500 mg by mouth at bedtime.  . metoprolol tartrate (LOPRESSOR) 25 MG tablet Take 1 tablet (25 mg total) by mouth 2 (two) times daily.  . Multiple Vitamin (MULTIVITAMIN WITH MINERALS) TABS tablet Take 1 tablet by mouth daily.  . niacin (NIASPAN) 1000 MG CR tablet TAKE 1 TABLET AT BEDTIME  . [DISCONTINUED] ciprofloxacin (CIPRO) 500 MG tablet Take 1 tablet (500 mg total) by mouth 2 (two) times daily.  (Patient not taking: Reported on 05/16/2017)  . [DISCONTINUED] clopidogrel (PLAVIX) 75 MG tablet Take 1 tablet (75 mg total) by mouth daily with breakfast. (Patient not taking: Reported on 05/16/2017)  . [DISCONTINUED] fesoterodine (TOVIAZ) 4 MG TB24 tablet Take 4 mg by mouth daily.  . [DISCONTINUED] mirabegron ER (MYRBETRIQ) 25 MG TB24 tablet Take 25 mg by mouth daily.  . [DISCONTINUED] niacin (NIASPAN) 1000 MG CR tablet Take 1,000 mg by mouth at bedtime.  . [DISCONTINUED] polyethylene glycol (MIRALAX / GLYCOLAX) packet Take 17 g by mouth daily.  . [DISCONTINUED] traMADol (ULTRAM) 50 MG tablet Take 1-2 tablets (50-100 mg total) by mouth every 12 (twelve) hours as needed for moderate pain.   No facility-administered encounter medications on file as of 05/16/2017.     Activities of Daily Living In your present state of health, do you have any difficulty performing the following activities: 05/16/2017 12/06/2016  Hearing? N N  Vision? N N  Difficulty concentrating or making decisions? N N  Walking or climbing stairs? N N  Dressing or bathing? N N  Doing errands, shopping? N N  Preparing Food and eating ? N -  Using the Toilet? N -  In the past six months, have you accidently leaked urine? N -  Do you have problems with loss of bowel control? N -  Managing your Medications? N -  Managing your Finances? N -  Housekeeping or managing your Housekeeping? N -  Some recent data might be hidden    Patient Care Team: Marin Olp, MD as PCP - General (Family Medicine)   Assessment:    Physical assessment deferred to PCP.  Exercise Activities and Dietary recommendations Current Exercise Habits: The patient does not participate in regular exercise at present, Exercise limited by: None identified  Goals    . decrease # of desserts eaten/week.      Fall Risk Fall Risk  05/16/2017 03/16/2016  Falls in the past year? No No   Depression Screen PHQ 2/9 Scores 05/16/2017 03/16/2016  PHQ  - 2 Score 1 0  PHQ- 9 Score 6 -    Cognitive Function MMSE - Mini Mental State Exam 05/16/2017  Orientation to time 5  Orientation to Place 5  Registration 3  Attention/ Calculation 5  Recall 3  Language- name 2 objects 2  Language- repeat 1  Language- follow 3 step command 3  Language- read & follow direction 1  Write a sentence 1  Copy design 1  Total score 30        Immunization History  Administered Date(s) Administered  . Influenza Split 05/12/2012  . Influenza Whole 07/24/2007, 05/14/2008, 05/17/2009  . Influenza-Unspecified 05/19/2014, 05/07/2016  . Pneumococcal Conjugate-13 03/16/2016  . Pneumococcal Polysaccharide-23 07/24/2007  . Td 07/09/2006  . Zoster  06/12/2014   Screening Tests Health Maintenance  Topic Date Due  . COLONOSCOPY  11/25/2014  . TETANUS/TDAP  07/09/2016  . INFLUENZA VACCINE  03/07/2017  . PNA vac Low Risk Adult  Completed      Plan:   Follow up with PCP as directed.  I have personally reviewed and noted the following in the patient's chart:   . Medical and social history . Use of alcohol, tobacco or illicit drugs  . Current medications and supplements . Functional ability and status . Nutritional status . Physical activity . Advanced directives . List of other physicians . Vitals . Screenings to include cognitive, depression, and falls . Referrals and appointments  In addition, I have reviewed and discussed with patient certain preventive protocols, quality metrics, and best practice recommendations. A written personalized care plan for preventive services as well as general preventive health recommendations were provided to patient.     Williemae Area, RN  05/16/2017

## 2017-05-16 ENCOUNTER — Telehealth: Payer: Self-pay | Admitting: Family Medicine

## 2017-05-16 ENCOUNTER — Encounter: Payer: Self-pay | Admitting: *Deleted

## 2017-05-16 ENCOUNTER — Ambulatory Visit (INDEPENDENT_AMBULATORY_CARE_PROVIDER_SITE_OTHER): Payer: Medicare Other | Admitting: *Deleted

## 2017-05-16 VITALS — BP 128/50 | HR 70 | Resp 16 | Ht 66.0 in | Wt 146.0 lb

## 2017-05-16 DIAGNOSIS — Z0000 Encounter for general adult medical examination without abnormal findings: Secondary | ICD-10-CM

## 2017-05-16 NOTE — Patient Instructions (Addendum)
Mr. Arizola , Thank you for taking time to come for your Medicare Wellness Visit. I appreciate your ongoing commitment to your health goals. Please review the following plan we discussed and let me know if I can assist you in the future.    This is a list of the screening recommended for you and due dates:  Health Maintenance  Topic Date Due  . Colon Cancer Screening  11/25/2014  . Tetanus Vaccine  05/16/2018*  . Flu Shot  Completed  . Pneumonia vaccines  Completed  *Topic was postponed. The date shown is not the original due date.   Preventive Care for Adults  A healthy lifestyle and preventive care can promote health and wellness. Preventive health guidelines for adults include the following key practices.  . A routine yearly physical is a good way to check with your health care provider about your health and preventive screening. It is a chance to share any concerns and updates on your health and to receive a thorough exam.  . Visit your dentist for a routine exam and preventive care every 6 months. Brush your teeth twice a day and floss once a day. Good oral hygiene prevents tooth decay and gum disease.  . The frequency of eye exams is based on your age, health, family medical history, use  of contact lenses, and other factors. Follow your health care provider's ecommendations for frequency of eye exams.  . Eat a healthy diet. Foods like vegetables, fruits, whole grains, low-fat dairy products, and lean protein foods contain the nutrients you need without too many calories. Decrease your intake of foods high in solid fats, added sugars, and salt. Eat the right amount of calories for you. Get information about a proper diet from your health care provider, if necessary.  . Regular physical exercise is one of the most important things you can do for your health. Most adults should get at least 150 minutes of moderate-intensity exercise (any activity that increases your heart rate and  causes you to sweat) each week. In addition, most adults need muscle-strengthening exercises on 2 or more days a week.  Silver Sneakers may be a benefit available to you. To determine eligibility, you may visit the website: www.silversneakers.com or contact program at 856-406-1809 Mon-Fri between 8AM-8PM.   . Maintain a healthy weight. The body mass index (BMI) is a screening tool to identify possible weight problems. It provides an estimate of body fat based on height and weight. Your health care provider can find your BMI and can help you achieve or maintain a healthy weight.   For adults 20 years and older: ? A BMI below 18.5 is considered underweight. ? A BMI of 18.5 to 24.9 is normal. ? A BMI of 25 to 29.9 is considered overweight. ? A BMI of 30 and above is considered obese.   . Maintain normal blood lipids and cholesterol levels by exercising and minimizing your intake of saturated fat. Eat a balanced diet with plenty of fruit and vegetables. Blood tests for lipids and cholesterol should begin at age 64 and be repeated every 5 years. If your lipid or cholesterol levels are high, you are over 50, or you are at high risk for heart disease, you may need your cholesterol levels checked more frequently. Ongoing high lipid and cholesterol levels should be treated with medicines if diet and exercise are not working.  . If you smoke, find out from your health care provider how to quit. If you do not  use tobacco, please do not start.  . If you choose to drink alcohol, please do not consume more than 2 drinks per day. One drink is considered to be 12 ounces (355 mL) of beer, 5 ounces (148 mL) of wine, or 1.5 ounces (44 mL) of liquor.  . If you are 53-51 years old, ask your health care provider if you should take aspirin to prevent strokes.  . Use sunscreen. Apply sunscreen liberally and repeatedly throughout the day. You should seek shade when your shadow is shorter than you. Protect yourself by  wearing long sleeves, pants, a wide-brimmed hat, and sunglasses year round, whenever you are outdoors.  . Once a month, do a whole body skin exam, using a mirror to look at the skin on your back. Tell your health care provider of new moles, moles that have irregular borders, moles that are larger than a pencil eraser, or moles that have changed in shape or color.

## 2017-05-16 NOTE — Telephone Encounter (Signed)
Patient's wife called in reference to to inform you of medication that was missing for patient. Medication is Tolterodine phosphate 4 MG once a day.

## 2017-05-17 NOTE — Telephone Encounter (Signed)
Noted.  Added to medication list. 

## 2017-05-17 NOTE — Progress Notes (Signed)
I have reviewed and agree with note, evaluation, plan. Agree with office visit if memory or depressive symptoms worsen  Garret Reddish, MD

## 2017-05-30 ENCOUNTER — Other Ambulatory Visit: Payer: Self-pay | Admitting: *Deleted

## 2017-05-30 DIAGNOSIS — I6529 Occlusion and stenosis of unspecified carotid artery: Secondary | ICD-10-CM

## 2017-06-01 ENCOUNTER — Encounter: Payer: Self-pay | Admitting: Acute Care

## 2017-07-10 DIAGNOSIS — N200 Calculus of kidney: Secondary | ICD-10-CM | POA: Diagnosis not present

## 2017-07-12 DIAGNOSIS — M545 Low back pain: Secondary | ICD-10-CM | POA: Diagnosis not present

## 2017-07-13 ENCOUNTER — Encounter: Payer: Self-pay | Admitting: Cardiovascular Disease

## 2017-07-20 ENCOUNTER — Encounter: Payer: Self-pay | Admitting: Family Medicine

## 2017-07-20 ENCOUNTER — Ambulatory Visit (INDEPENDENT_AMBULATORY_CARE_PROVIDER_SITE_OTHER): Payer: Medicare Other | Admitting: Family Medicine

## 2017-07-20 VITALS — BP 120/62 | HR 79 | Temp 97.9°F | Ht 66.0 in | Wt 145.2 lb

## 2017-07-20 DIAGNOSIS — I6523 Occlusion and stenosis of bilateral carotid arteries: Secondary | ICD-10-CM

## 2017-07-20 DIAGNOSIS — E785 Hyperlipidemia, unspecified: Secondary | ICD-10-CM | POA: Diagnosis not present

## 2017-07-20 DIAGNOSIS — I10 Essential (primary) hypertension: Secondary | ICD-10-CM

## 2017-07-20 DIAGNOSIS — I2510 Atherosclerotic heart disease of native coronary artery without angina pectoris: Secondary | ICD-10-CM

## 2017-07-20 LAB — COMPREHENSIVE METABOLIC PANEL
ALBUMIN: 4.1 g/dL (ref 3.5–5.2)
ALK PHOS: 60 U/L (ref 39–117)
ALT: 14 U/L (ref 0–53)
AST: 21 U/L (ref 0–37)
BILIRUBIN TOTAL: 0.6 mg/dL (ref 0.2–1.2)
BUN: 19 mg/dL (ref 6–23)
CALCIUM: 9.5 mg/dL (ref 8.4–10.5)
CO2: 30 mEq/L (ref 19–32)
Chloride: 106 mEq/L (ref 96–112)
Creatinine, Ser: 1.16 mg/dL (ref 0.40–1.50)
GFR: 65.05 mL/min (ref >60.00)
Glucose, Bld: 114 mg/dL — ABNORMAL HIGH (ref 70–99)
POTASSIUM: 4.5 meq/L (ref 3.5–5.1)
Sodium: 140 mEq/L (ref 135–145)
TOTAL PROTEIN: 6.5 g/dL (ref 6.0–8.3)

## 2017-07-20 LAB — CBC
HEMATOCRIT: 40.9 % (ref 39.0–52.0)
HEMOGLOBIN: 14.1 g/dL (ref 13.0–17.0)
MCHC: 34.6 g/dL (ref 30.0–36.0)
MCV: 97.3 fl (ref 78.0–100.0)
PLATELETS: 228 10*3/uL (ref 150.0–400.0)
RBC: 4.21 Mil/uL — AB (ref 4.22–5.81)
RDW: 14.2 % (ref 11.5–15.5)
WBC: 7.9 10*3/uL (ref 4.0–10.5)

## 2017-07-20 LAB — LDL CHOLESTEROL, DIRECT: LDL DIRECT: 65 mg/dL

## 2017-07-20 NOTE — Patient Instructions (Signed)
Glad you are doing better!   I know this has been a tough time- if you want to talk with a counselor that is certainly reasonable  No changes in medicine at this point

## 2017-07-20 NOTE — Assessment & Plan Note (Signed)
Asymptomatic- denies chest pain or shortness of breath. Continue on aspirin and statin.

## 2017-07-20 NOTE — Progress Notes (Signed)
Subjective:  Brett [name] is a 76 y.o. year old very pleasant male patient who presents for/with See problem oriented charting ROS- No chest pain or shortness of breath. No headache or blurry vision.    Past Medical History-  Patient Active Problem List   Diagnosis Date Noted  . Coronary artery disease involving native coronary artery of native heart without angina pectoris 12/12/2016    Priority: High  . 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 Elevated PSA 03/17/2016    Priority: High  . Pulmonary nodule 06/12/2014    Priority: High  . TOBACCO ABUSE 07/26/2009    Priority: High  . Carotid stenosis, asymptomatic, unspecified laterality 07/26/2009    Priority: High  . Peripheral vascular disease ([place]) 07/26/2009    Priority: High  . 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 Hyperlipidemia 11/22/2007    Priority: Medium  . Essential hypertension 02/05/2007    Priority: Medium  . Nephrolith 03/17/2016    Priority: Low  . Iron deficiency anemia due to chronic blood loss 08/15/2016  . 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    Medications- reviewed and updated Current Outpatient Medications  Medication Sig Dispense Refill  . acetaminophen (TYLENOL) 500 MG tablet Take 1,000 mg by mouth daily as needed.     Marland Kitchen alfuzosin (UROXATRAL) 10 MG 24 hr tablet Take 10 mg by mouth at bedtime.  99  . amLODipine (NORVASC) 5 MG tablet Take 1 tablet (5 mg total) by mouth daily. 90 tablet 3  . Ascorbic Acid (VITAMIN C) 500 MG tablet Take 500 mg by mouth daily.      Marland Kitchen aspirin EC 81 MG EC tablet Take 1 tablet (81 mg total) by mouth daily.    Marland Kitchen atorvastatin (LIPITOR) 40 MG tablet Take 1 tablet (40 mg total) by mouth daily. 90 tablet 3  . Calcium Carbonate-Vitamin D (CALTRATE 600+D) 600-400 MG-UNIT per tablet Take 1 tablet by mouth daily.      Marland Kitchen docusate sodium (COLACE) 100 MG capsule Take 100 mg by  mouth at bedtime.    Marland Kitchen doxycycline (VIBRA-TABS) 100 MG tablet Take 100 mg by mouth 2 (two) times daily.  0  . finasteride (PROSCAR) 5 MG tablet Take 5 mg by mouth daily.  3  . Inositol Niacinate (NIACIN FLUSH FREE) 500 MG CAPS Take 500 mg by mouth at bedtime.    . metoprolol tartrate (LOPRESSOR) 25 MG tablet Take 1 tablet (25 mg total) by mouth 2 (two) times daily. 180 tablet 3  . Multiple Vitamin (MULTIVITAMIN WITH MINERALS) TABS tablet Take 1 tablet by mouth daily.    . niacin (NIASPAN) 1000 MG CR tablet TAKE 1 TABLET AT BEDTIME 90 tablet 3  . tolterodine (DETROL) 2 MG tablet Take 4 mg by mouth 2 (two) times daily.     No current facility-administered medications for this visit.     Objective: BP 120/62 (BP Location: Left Arm, Patient Position: Sitting, Cuff Size: Large)   Pulse 79   Temp 97.9 F (36.6 C) (Oral)   Ht 5\' 6"  (1.676 m)   Wt 145 lb 3.2 oz (65.9 kg)   SpO2 97%   BMI 23.44 kg/m  Gen: NAD, resting comfortably CV: RRR no rubs or gallops Lungs: CTAB no crackles, wheeze, rhonchi Abdomen: soft/nontender/nondistended/normal bowel sounds. Healthy weight Ext: no edema Skin: warm, dry, no rash Msk: mild pain to palpation musculature left low back  Assessment/Plan:  Other issues.  1. On doxycycline for UTI through urology 2. Bladder stone and kidney stone- apparently not causing his left side. Left low back and towards center  3. 09/04/16 biopsy prostate negative for cancer 4. May 1st TAVR- weakness , lethargy way better 5. phq9 6, now down to 5. Not feeling depressed- just feels multiple medical challenges have not been easy- including TAVR this year. Little interest in doing things as recovering form surgery. No SI. He agrees to follow up for new or worsening symptoms. Did at least take behavioral health handout given lifestyle challenges from illnesses.   Essential hypertension S: controlled on amlodipine 5mg , metoprolol 25mg  BID BP Readings from Last 3 Encounters:   07/20/17 120/62  05/16/17 (!) 128/50  12/27/16 (!) 150/60  A/P: We discussed blood pressure goal of <140/90. Continue current meds  Hyperlipidemia S: well controlled on last  Check on atorvastatin 40mg  and niacin. No myalgias.   A/P: LDL today at 65- excellent control, no changes  Coronary artery disease involving native coronary artery of native heart without angina pectoris Asymptomatic- denies chest pain or shortness of breath. Continue on aspirin and statin.   Future Appointments  Date Time Provider [place]  08/03/2017  9:40 AM Sherren Mocha, MD [facility]   6 months reasonable  Orders Placed This Encounter  Procedures  . CBC    [place]  . Comprehensive metabolic panel    [place]  . LDL cholesterol, direct    [place]   Return precautions advised.  Garret Reddish, MD

## 2017-07-20 NOTE — Assessment & Plan Note (Signed)
S: well controlled on last  Check on atorvastatin 40mg  and niacin. No myalgias.   A/P: LDL today at 65- excellent control, no changes

## 2017-07-20 NOTE — Assessment & Plan Note (Signed)
S: controlled on amlodipine 5mg , metoprolol 25mg  BID BP Readings from Last 3 Encounters:  07/20/17 120/62  05/16/17 (!) 128/50  12/27/16 (!) 150/60  A/P: We discussed blood pressure goal of <140/90. Continue current meds

## 2017-08-01 ENCOUNTER — Ambulatory Visit: Payer: Medicare Other | Admitting: Family Medicine

## 2017-08-03 ENCOUNTER — Encounter: Payer: Self-pay | Admitting: Cardiovascular Disease

## 2017-08-03 ENCOUNTER — Ambulatory Visit (INDEPENDENT_AMBULATORY_CARE_PROVIDER_SITE_OTHER): Payer: Medicare Other | Admitting: Cardiovascular Disease

## 2017-08-03 ENCOUNTER — Other Ambulatory Visit: Payer: Self-pay | Admitting: Cardiovascular Disease

## 2017-08-03 VITALS — BP 142/58 | HR 70 | Ht 66.0 in | Wt 146.0 lb

## 2017-08-03 DIAGNOSIS — I359 Nonrheumatic aortic valve disorder, unspecified: Secondary | ICD-10-CM | POA: Diagnosis not present

## 2017-08-03 DIAGNOSIS — I6523 Occlusion and stenosis of bilateral carotid arteries: Secondary | ICD-10-CM

## 2017-08-03 DIAGNOSIS — I2510 Atherosclerotic heart disease of native coronary artery without angina pectoris: Secondary | ICD-10-CM | POA: Diagnosis not present

## 2017-08-03 DIAGNOSIS — I10 Essential (primary) hypertension: Secondary | ICD-10-CM | POA: Diagnosis not present

## 2017-08-03 MED ORDER — AMOXICILLIN 500 MG PO TABS: 2000.0000 mg | ORAL_TABLET | ORAL | 3 refills | Status: DC

## 2017-08-03 NOTE — Progress Notes (Signed)
Cardiology Office Note Date:  08/03/2017   ID:  TRAYLEN ECKELS, DOB 04-19-1941, MRN [id number]  PCP:  Marin Olp, MD  Cardiologist:  Sherren Mocha, MD    Chief Complaint  Patient presents with  . Follow-up    Aortic Stenosis     History of Present Illness: Brett [name] is a 76 y.o. male who presents for follow-up evaluation.   The patient has been followed for coronary artery disease status post CABG. He developed progressive and severe aortic stenosis and underwent transfemoral TAVR 63/87/5643 without complication. He was treated with a 26 mm Edwards sapien 3 valve.  The patient is here alone today.  He is doing well from a cardiac perspective.  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  He denies any symptoms of chest pain, heart palpitations, shortness of breath, lightheadedness, syncope, edema, orthopnea, or PND.  He has developed left flank pain and initially thought he had a back problem.  However, he came to find that he has a left kidney stone as well as a large bladder stone.  He is under evaluation by urology at present.   Past Medical History:  Diagnosis Date  . Carotid stenosis   . Elevated PSA   . 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 Glomerulonephritis 1961  . Hyperglycemia   . Hyperlipidemia   . Hyperplastic colon polyp 11/24/2009   x7  . Hypertension   . Iron deficiency anemia due to chronic blood loss 08/15/2016  . Leukocytosis 08/02/2016  . 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  . PVD (peripheral vascular disease) ([place])   . 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 BYPASS GRAFT  2006  . CORONARY ARTERY BYPASS GRAFT   12/07/2004   CABG x5 Dr [name]  . RIGHT HEART CATH AND CORONARY/GRAFT ANGIOGRAPHY N/A 11/21/2016   Procedure: Right Heart Cath and Coronary/Graft Angiography;  Surgeon: Sherren Mocha, MD;  Location: [facility];  Service: Cardiovascular;  Laterality: N/A;  . TEE WITHOUT CARDIOVERSION N/A 12/05/2016   Procedure: TRANSESOPHAGEAL ECHOCARDIOGRAM (TEE);  Surgeon: Sherren Mocha, MD;  Location: [facility];  Service: Open Heart Surgery;  Laterality: N/A;  . TRANSCATHETER AORTIC VALVE REPLACEMENT, TRANSFEMORAL N/A 12/05/2016   Procedure: TRANSCATHETER AORTIC VALVE REPLACEMENT, TRANSFEMORAL;  Surgeon: Sherren Mocha, MD;  Location: [facility];  Service: Open Heart Surgery;  Laterality: N/A;    Current Outpatient Medications  Medication Sig Dispense Refill  . acetaminophen (TYLENOL) 500 MG tablet Take 1,000 mg by mouth daily as needed (pain).     Marland Kitchen amLODipine (NORVASC) 5 MG tablet Take 1 tablet (5 mg total) by mouth daily. 90 tablet 3  . Ascorbic Acid (VITAMIN C) 500 MG tablet Take 500 mg by mouth daily.      Marland Kitchen aspirin EC 81 MG EC tablet Take 1 tablet (81 mg total) by mouth daily.    Marland Kitchen atorvastatin (LIPITOR) 40 MG tablet TAKE 1 TABLET DAILY 90 tablet 1  . Calcium Carbonate-Vitamin D (CALTRATE 600+D) 600-400 MG-UNIT per tablet Take 1 tablet by mouth daily.      . finasteride (PROSCAR) 5 MG tablet Take 5 mg by mouth daily.  3  . Inositol  Niacinate (NIACIN FLUSH FREE) 500 MG CAPS Take 500 mg by mouth at bedtime.    . metoprolol tartrate (LOPRESSOR) 25 MG tablet Take 1 tablet (25 mg total) by mouth 2 (two) times daily. 180 tablet 3  . Multiple Vitamin (MULTIVITAMIN WITH MINERALS) TABS tablet Take 1 tablet by mouth daily.    . niacin (NIASPAN) 1000 MG CR tablet TAKE 1 TABLET AT BEDTIME 90 tablet 3  . tolterodine (DETROL) 2 MG tablet Take 4 mg by mouth 2 (two) times daily.    Marland Kitchen amoxicillin (AMOXIL) 500 MG tablet Take 4 tablets (2,000 mg total) by mouth as directed. Take one hour prior to dental procedures. 4 tablet  3   No current facility-administered medications for this visit.     Allergies:   No known allergies   Social History:  The patient  reports that he quit smoking about 19 months ago. His smoking use included cigarettes. He has a 40.00 pack-year smoking history. He quit smokeless tobacco use about 19 months ago. He reports that he drinks alcohol. He reports that he does not use drugs.   Family History:  The patient's family history includes Dementia in his mother; Heart attack in his father.    ROS:  Please see the history of present illness.  All other systems are reviewed and negative.   PHYSICAL EXAM: VS:  BP (!) 142/58   Pulse 70   Ht 5\' 6"  (1.676 m)   Wt 146 lb (66.2 kg)   BMI 23.57 kg/m  , BMI Body mass index is 23.57 kg/m. GEN: Well nourished, well developed, in no acute distress  HEENT: normal  Neck: no JVD, no masses. No carotid bruits Cardiac: RRR without murmur or gallop     Respiratory:  clear to auscultation bilaterally, normal work of breathing GI: soft, nontender, nondistended, + BS MS: no deformity or atrophy  Ext: no pretibial edema, pedal pulses 2+= bilaterally Skin: warm and dry, no rash Neuro:  Strength and sensation are intact Psych: euthymic mood, full affect  EKG:  EKG is ordered today. The ekg ordered today shows normal sinus rhythm 70 bpm, within normal limits.  Recent Labs: 12/06/2016: Magnesium 1.4 07/20/2017: ALT 14; BUN 19; Creatinine, Ser 1.16; Hemoglobin 14.1; Platelets 228.0; Potassium 4.5; Sodium 140   Lipid Panel     Component Value Date/Time   CHOL 128 03/17/2016 0756   TRIG 80.0 03/17/2016 0756   TRIG 93 06/25/2006 0901   HDL 52.20 03/17/2016 0756   CHOLHDL 2 03/17/2016 0756   VLDL 16.0 03/17/2016 0756   LDLCALC 60 03/17/2016 0756   LDLDIRECT 65.0 07/20/2017 1058      Wt Readings from Last 3 Encounters:  08/03/17 146 lb (66.2 kg)  07/20/17 145 lb 3.2 oz (65.9 kg)  05/16/17 146 lb (66.2 kg)     Cardiac Studies Reviewed: Echo  12-27-2016: Left ventricle:  The cavity size was normal. Wall thickness was normal. Systolic function was vigorous. The estimated ejection fraction was in the range of 65% to 70%. Wall motion was normal; there were no regional wall motion abnormalities. Doppler parameters are consistent with abnormal left ventricular relaxation (grade 1 diastolic dysfunction).  ------------------------------------------------------------------- Aortic valve:  A bioprosthesis was present.  Doppler:  There was no regurgitation.    VTI ratio of LVOT to aortic valve: 0.49. Peak velocity ratio of LVOT to aortic valve: 0.38. Mean velocity ratio of LVOT to aortic valve: 0.47.    Mean gradient (S): 9 mm Hg. Peak gradient (S): 19  mm Hg.  ------------------------------------------------------------------- Mitral valve:   Mildly calcified annulus. Leaflet separation was normal.  Doppler:  Transvalvular velocity was within the normal range. There was no evidence for stenosis. There was mild to moderate regurgitation.    Peak gradient (D): 5 mm Hg.  ------------------------------------------------------------------- Left atrium:  The atrium was mildly dilated.  ------------------------------------------------------------------- Right ventricle:  The cavity size was normal. Systolic function was normal.  ------------------------------------------------------------------- Pulmonic valve:    The valve appears to be grossly normal. Doppler:  There was no significant regurgitation.  ------------------------------------------------------------------- Tricuspid valve:   The valve appears to be grossly normal. Doppler:  There was trivial regurgitation.  ------------------------------------------------------------------- Right atrium:  The atrium was normal in size.  ------------------------------------------------------------------- Pericardium:  There was no pericardial effusion.  Cardiac Cath  11-21-2016: Conclusion   1. Known severe aortic stenosis 2. Severe 3 vessel CAD with severe diffuse proximal LAD stenosis, total occlusion of a severely calcified RCA, and severe LCx stenosis 3. S/P CABG with continued patency of all 5 bypass grafts 4. Severe calcified aortic valve with restricted opening 5. Severe calcified ascending aorta by fluoroscopy  Recommend: Continued TAVR evaluation for treatment of severe symptomatic aortic stenosis   ASSESSMENT AND PLAN: 1.  Coronary artery disease, native vessel, without angina: Continues to do well on antiplatelet therapy with aspirin and clopidogrel, statin drug, and a beta-blocker.  2.  Aortic valve disease status post TAVR: The patient's 1 month postoperative echo is reviewed and demonstrated normal transcatheter valve function with a mean gradient of 9 mmHg and no significant paravalvular regurgitation.  The patient will discontinue clopidogrel as he is now L beyond 6 months from TAVR.  He should remain on lifelong antiplatelet therapy with aspirin 81 mg.  SBE prophylaxis guidelines are reviewed today with him.  A prescription for amoxicillin 2 g is updated.  3.  Hypertension: Blood pressure is controlled on current therapy.  4.  Hyperlipidemia: Treated with atorvastatin 40 mg daily.  Most recent labs are reviewed. Last LDL cholesterol is 60 mg/dL.   Current medicines are reviewed with the patient today.  The patient does not have concerns regarding medicines.  Labs/ tests ordered today include:   Orders Placed This Encounter  Procedures  . EKG 12-Lead    Disposition:   FU 6 months with an echo at that time (one year TAVR study)  Signed, Sherren Mocha, MD  08/03/2017 1:39 PM    [facility] [address], [place], [place]  [address] Phone: [phone]; Fax: [phone]

## 2017-08-03 NOTE — Patient Instructions (Addendum)
Medication Instructions:  1) STOP PLAVIX 2) TAKE AMOXIL 2 g one hour prior to dental procedures  Labwork: None  Testing/Procedures: Your provider has requested that you have an echocardiogram in May, 2019. Echocardiography is a painless test that uses sound waves to create images of your heart. It provides your doctor with information about the size and shape of your heart and how well your heart's chambers and valves are working. This procedure takes approximately one hour. There are no restrictions for this procedure.  Follow-Up: Your provider wants you to follow-up in: May, 2019 with Dr. Burt Knack. You will receive a reminder letter in the mail two months in advance. If you don't receive a letter, please call our office to schedule the follow-up appointment.    Any Other Special Instructions Will Be Listed Below (If Applicable). Your physician discussed the importance of taking an antibiotic prior to any dental, gastrointestinal, genitourinary procedures to prevent damage to the heart valves from infection. You were given a prescription for an antibiotic based on current SBE prophylaxis guidelines. You have been given a prescription for Amoxil 2 g for procedures.     If you need a refill on your cardiac medications before your next appointment, please call your pharmacy.

## 2017-08-08 ENCOUNTER — Other Ambulatory Visit: Payer: Self-pay

## 2017-08-08 DIAGNOSIS — I359 Nonrheumatic aortic valve disorder, unspecified: Secondary | ICD-10-CM

## 2017-08-24 DIAGNOSIS — R972 Elevated prostate specific antigen [PSA]: Secondary | ICD-10-CM | POA: Diagnosis not present

## 2017-08-24 LAB — PSA: PSA: 2.38

## 2017-08-31 DIAGNOSIS — N200 Calculus of kidney: Secondary | ICD-10-CM | POA: Diagnosis not present

## 2017-08-31 DIAGNOSIS — R972 Elevated prostate specific antigen [PSA]: Secondary | ICD-10-CM | POA: Diagnosis not present

## 2017-08-31 DIAGNOSIS — R351 Nocturia: Secondary | ICD-10-CM | POA: Diagnosis not present

## 2017-08-31 DIAGNOSIS — N3000 Acute cystitis without hematuria: Secondary | ICD-10-CM | POA: Diagnosis not present

## 2017-08-31 DIAGNOSIS — N401 Enlarged prostate with lower urinary tract symptoms: Secondary | ICD-10-CM | POA: Diagnosis not present

## 2017-09-03 ENCOUNTER — Encounter: Payer: Self-pay | Admitting: Family Medicine

## 2017-09-05 ENCOUNTER — Other Ambulatory Visit: Payer: Self-pay | Admitting: Urology

## 2017-09-05 DIAGNOSIS — N200 Calculus of kidney: Secondary | ICD-10-CM

## 2017-09-13 ENCOUNTER — Telehealth: Payer: Self-pay

## 2017-09-13 NOTE — Telephone Encounter (Signed)
   [facility] Pre-operative Risk Assessment    Request for surgical clearance:  1. What type of surgery is being performed? 1st Cystolitholapaxy 2nd Left percutaneous Nephrolithotomy   2. When is this surgery scheduled? 1st 09/24/17 2nd 10/11/17  3. What type of clearance is required (medical clearance vs. Pharmacy clearance to hold med vs. Both)? Surgical Clearance   4. Are there any medications that need to be held prior to surgery and how long? None  5. Practice name and name of physician performing surgery? [facility]   6. What is your office phone and fax number? Phone: [phone] Fax: [phone]  7. Anesthesia type (None, local, MAC, general) ? Not listed    Mendel Ryder 09/13/2017, 1:45 PM  _________________________________________________________________   (provider comments below) There are two different surgeries being performed

## 2017-09-13 NOTE — Telephone Encounter (Addendum)
I called and discussed SBE prophylaxis with Joellen Jersey in the TAVR clinic. She will check with Dr Burt Knack (he is not available today) and get back with the patient and let him know specific recomendations. I also spoke with the patient. He denies any unusual chest pain or dyspnea. He has been stable from a cardiac standpoint and should be OK for surgery.   Kerin Ransom PA-C 09/13/2017 3:55 PM

## 2017-09-14 ENCOUNTER — Telehealth: Payer: Self-pay | Admitting: Physician Assistant

## 2017-09-14 NOTE — Telephone Encounter (Signed)
  [facility]   MULTIDISCIPLINARY HEART VALVE TEAM   I have discussed case with Dr. [name] and we both feel that Mr. Chauvin will need prophylactic abx prior to upcoming urologic procedure given TAVR. I have spoke to his wife about this. I am waiting to hear back from Dr. Alyson Ingles to see if they will treat with IV abx pre operatively that would cover his heart valve. If not, I I will call in oral amoxicillin to take 1 hour pre operatively.   Angelena Form PA-C  [facility]

## 2017-09-17 ENCOUNTER — Telehealth: Payer: Self-pay | Admitting: Physician Assistant

## 2017-09-17 NOTE — Telephone Encounter (Signed)
  [facility]   MULTIDISCIPLINARY HEART VALVE TEAM  I spoke with Dr. Noah Delaine who will be giving Ancef preoperatively, which will cover SBE prophylaxis given his TAVR valve.   Angelena Form PA-C  [facility]

## 2017-09-18 NOTE — Progress Notes (Signed)
CARDIAC CLEARANCE LUKE KILROY,PA-C 09-13-17 Epic TELEPHONE NOTE   LOV CARDIOLOGY DR. Sherren Mocha 08-03-17 Epic  EKG 08-03-17 Epic  ECHO 12-27-16 Epic  CXR 12-06-16 EPIC

## 2017-09-18 NOTE — Patient Instructions (Addendum)
DELYLE WEIDER  09/18/2017   Your procedure is scheduled on: 09-24-17   Report to [facility] Main  Entrance    Report to admitting at 10:45AM   Call this number if you have problems the morning of surgery [phone]     Remember: Do not eat food or drink liquids :After Midnight.     Take these medicines the morning of surgery with A SIP OF WATER: METOPROLOL, AMLODIPINE, TYLENOL IF NEEDED, ATORVASTATIN                                You may not have any metal on your body including hair pins and              piercings  Do not wear jewelry, make-up, lotions, powders or perfumes, deodorant                    Men may shave face and neck.   Do not bring valuables to the hospital. [place].  Contacts, dentures or bridgework may not be worn into surgery.      Patients discharged the day of surgery will not be allowed to drive home.  Name and phone number of your driver:  Special Instructions: N/A              Please read over the following fact sheets you were given: _____________________________________________________________________             [facility] - Preparing for Surgery Before surgery, you can play an important role.  Because skin is not sterile, your skin needs to be as free of germs as possible.  You can reduce the number of germs on your skin by washing with CHG (chlorahexidine gluconate) soap before surgery.  CHG is an antiseptic cleaner which kills germs and bonds with the skin to continue killing germs even after washing. Please DO NOT use if you have an allergy to CHG or antibacterial soaps.  If your skin becomes reddened/irritated stop using the CHG and inform your nurse when you arrive at Short Stay. Do not shave (including legs and underarms) for at least 48 hours prior to the first CHG shower.  You may shave your face/neck. Please follow these instructions carefully:  1.  Shower  with CHG Soap the night before surgery and the  morning of Surgery.  2.  If you choose to wash your hair, wash your hair first as usual with your  normal  shampoo.  3.  After you shampoo, rinse your hair and body thoroughly to remove the  shampoo.                           4.  Use CHG as you would any other liquid soap.  You can apply chg directly  to the skin and wash                       Gently with a scrungie or clean washcloth.  5.  Apply the CHG Soap to your body ONLY FROM THE NECK DOWN.   Do not use on face/ open  Wound or open sores. Avoid contact with eyes, ears mouth and genitals (private parts).                       Wash face,  Genitals (private parts) with your normal soap.             6.  Wash thoroughly, paying special attention to the area where your surgery  will be performed.  7.  Thoroughly rinse your body with warm water from the neck down.  8.  DO NOT shower/wash with your normal soap after using and rinsing off  the CHG Soap.                9.  Pat yourself dry with a clean towel.            10.  Wear clean pajamas.            11.  Place clean sheets on your bed the night of your first shower and do not  sleep with pets. Day of Surgery : Do not apply any lotions/deodorants the morning of surgery.  Please wear clean clothes to the hospital/surgery center.  FAILURE TO FOLLOW THESE INSTRUCTIONS MAY RESULT IN THE CANCELLATION OF YOUR SURGERY PATIENT SIGNATURE_________________________________  NURSE SIGNATURE__________________________________  ________________________________________________________________________

## 2017-09-19 NOTE — Telephone Encounter (Signed)
Clearance faxed to [facility] vis Epic

## 2017-09-19 NOTE — Telephone Encounter (Signed)
Patient was cleared by Kerin Ransom on 09/13/2017, most recently Angelena Form of valve clinic has also discussed the case with Dr. [name] and Dr. Alyson Ingles, the plan to is prophylacticly treat the patient with antibiotic prior to surgery given h/o TAVR. See separate note on abx prophylaxis. Please forward it to requesting provider and will close this preoperative request note.   Hilbert Corrigan PA Pager: [phone]

## 2017-09-20 ENCOUNTER — Encounter (HOSPITAL_COMMUNITY)
Admission: RE | Admit: 2017-09-20 | Discharge: 2017-09-20 | Disposition: A | Discharge status: Home / Self Care | Payer: Medicare Other | Source: Ambulatory Visit | Attending: Urology | Admitting: Urology

## 2017-09-20 ENCOUNTER — Encounter (HOSPITAL_COMMUNITY): Payer: Self-pay

## 2017-09-20 ENCOUNTER — Other Ambulatory Visit: Payer: Self-pay

## 2017-09-20 DIAGNOSIS — N210 Calculus in bladder: Secondary | ICD-10-CM | POA: Insufficient documentation

## 2017-09-20 DIAGNOSIS — Z01818 Encounter for other preprocedural examination: Secondary | ICD-10-CM | POA: Insufficient documentation

## 2017-09-20 LAB — BASIC METABOLIC PANEL
Anion gap: 8 (ref 5–15)
BUN: 22 mg/dL — AB (ref 6–20)
CALCIUM: 9.5 mg/dL (ref 8.9–10.3)
CO2: 27 mmol/L (ref 22–32)
Chloride: 109 mmol/L (ref 101–111)
Creatinine, Ser: 1.21 mg/dL (ref 0.61–1.24)
GFR calc Af Amer: >60 mL/min (ref >60)
GFR, EST NON AFRICAN AMERICAN: 56 mL/min — AB (ref >60)
GLUCOSE: 109 mg/dL — AB (ref 65–99)
Potassium: 4.9 mmol/L (ref 3.5–5.1)
SODIUM: 144 mmol/L (ref 135–145)

## 2017-09-20 LAB — CBC
HCT: 38.2 % — ABNORMAL LOW (ref 39.0–52.0)
Hemoglobin: 13.5 g/dL (ref 13.0–17.0)
MCH: 34 pg (ref 26.0–34.0)
MCHC: 35.3 g/dL (ref 30.0–36.0)
MCV: 96.2 fL (ref 78.0–100.0)
Platelets: 203 10*3/uL (ref 150–400)
RBC: 3.97 MIL/uL — ABNORMAL LOW (ref 4.22–5.81)
RDW: 13.7 % (ref 11.5–15.5)
WBC: 8.4 10*3/uL (ref 4.0–10.5)

## 2017-09-21 ENCOUNTER — Encounter (HOSPITAL_COMMUNITY): Payer: Self-pay | Admitting: Anesthesiology

## 2017-09-21 NOTE — Anesthesia Preprocedure Evaluation (Deleted)
Anesthesia Evaluation    Airway       Dental   Pulmonary former smoker,          Cardiovascular hypertension,     Neuro/Psych    GI/Hepatic   Endo/Other    Renal/GU      Musculoskeletal   Abdominal   Peds  Hematology   Anesthesia Other Findings   Reproductive/Obstetrics                          Anesthesia Physical Anesthesia Plan Anesthesia Quick Evaluation  

## 2017-09-24 ENCOUNTER — Encounter (HOSPITAL_COMMUNITY): Admission: RE | Payer: Self-pay | Source: Ambulatory Visit

## 2017-09-24 ENCOUNTER — Other Ambulatory Visit: Payer: Self-pay | Admitting: Urology

## 2017-09-24 ENCOUNTER — Ambulatory Visit (HOSPITAL_COMMUNITY): Admission: RE | Admit: 2017-09-24 | Payer: Medicare Other | Source: Ambulatory Visit | Admitting: Urology

## 2017-09-24 SURGERY — CYSTOSCOPY, WITH BLADDER CALCULUS LITHOLAPAXY
Anesthesia: General

## 2017-09-24 NOTE — Progress Notes (Signed)
Patient called and told to arrive at 0900am for surgery on 09/25/17 and to follow the same preop instructions from 09/24/17.  Patient aware to go to Admitting.  Patient voiced understanding.

## 2017-09-25 ENCOUNTER — Ambulatory Visit (HOSPITAL_COMMUNITY)
Admission: RE | Admit: 2017-09-25 | Discharge: 2017-09-25 | Disposition: A | Discharge status: Home / Self Care | Payer: Medicare Other | Source: Ambulatory Visit | Attending: Urology | Admitting: Urology

## 2017-09-25 ENCOUNTER — Encounter (HOSPITAL_COMMUNITY)
Admission: RE | Disposition: A | Discharge status: Home / Self Care | Payer: Self-pay | Source: Ambulatory Visit | Attending: Urology

## 2017-09-25 ENCOUNTER — Ambulatory Visit (HOSPITAL_COMMUNITY): Payer: Medicare Other | Admitting: Anesthesiology

## 2017-09-25 ENCOUNTER — Encounter (HOSPITAL_COMMUNITY): Payer: Self-pay | Admitting: Certified Registered Nurse Anesthetist

## 2017-09-25 ENCOUNTER — Other Ambulatory Visit: Payer: Self-pay

## 2017-09-25 DIAGNOSIS — N210 Calculus in bladder: Secondary | ICD-10-CM | POA: Insufficient documentation

## 2017-09-25 DIAGNOSIS — I6529 Occlusion and stenosis of unspecified carotid artery: Secondary | ICD-10-CM | POA: Diagnosis not present

## 2017-09-25 DIAGNOSIS — N200 Calculus of kidney: Secondary | ICD-10-CM | POA: Diagnosis not present

## 2017-09-25 DIAGNOSIS — I10 Essential (primary) hypertension: Secondary | ICD-10-CM | POA: Insufficient documentation

## 2017-09-25 DIAGNOSIS — I739 Peripheral vascular disease, unspecified: Secondary | ICD-10-CM | POA: Diagnosis not present

## 2017-09-25 DIAGNOSIS — Z952 Presence of prosthetic heart valve: Secondary | ICD-10-CM | POA: Diagnosis not present

## 2017-09-25 DIAGNOSIS — Z87891 Personal history of nicotine dependence: Secondary | ICD-10-CM | POA: Diagnosis not present

## 2017-09-25 DIAGNOSIS — I2510 Atherosclerotic heart disease of native coronary artery without angina pectoris: Secondary | ICD-10-CM | POA: Diagnosis not present

## 2017-09-25 DIAGNOSIS — Z8249 Family history of ischemic heart disease and other diseases of the circulatory system: Secondary | ICD-10-CM | POA: Insufficient documentation

## 2017-09-25 DIAGNOSIS — N401 Enlarged prostate with lower urinary tract symptoms: Secondary | ICD-10-CM | POA: Diagnosis not present

## 2017-09-25 DIAGNOSIS — Z951 Presence of aortocoronary bypass graft: Secondary | ICD-10-CM | POA: Diagnosis not present

## 2017-09-25 DIAGNOSIS — R300 Dysuria: Secondary | ICD-10-CM | POA: Diagnosis not present

## 2017-09-25 DIAGNOSIS — E785 Hyperlipidemia, unspecified: Secondary | ICD-10-CM | POA: Insufficient documentation

## 2017-09-25 HISTORY — PX: CYSTOSCOPY WITH LITHOLAPAXY: SHX1425

## 2017-09-25 HISTORY — PX: HOLMIUM LASER APPLICATION: SHX5852

## 2017-09-25 SURGERY — CYSTOSCOPY, WITH BLADDER CALCULUS LITHOLAPAXY
Anesthesia: General

## 2017-09-25 MED ORDER — PHENYLEPHRINE 40 MCG/ML (10ML) SYRINGE FOR IV PUSH (FOR BLOOD PRESSURE SUPPORT)
PREFILLED_SYRINGE | INTRAVENOUS | Status: DC | PRN
  Administered 2017-09-25: 120 ug via INTRAVENOUS
  Administered 2017-09-25: 80 ug via INTRAVENOUS

## 2017-09-25 MED ORDER — CEFAZOLIN SODIUM-DEXTROSE 2-4 GM/100ML-% IV SOLN
2.0000 g | INTRAVENOUS | Status: AC
  Administered 2017-09-25: 2 g via INTRAVENOUS
  Filled 2017-09-25: qty 100

## 2017-09-25 MED ORDER — HYDROCODONE-ACETAMINOPHEN 5-325 MG PO TABS: 1.0000 | ORAL_TABLET | Freq: Four times a day (QID) | ORAL | 0 refills | Status: DC | PRN

## 2017-09-25 MED ORDER — EPHEDRINE 5 MG/ML INJ
INTRAVENOUS | Status: AC
Start: 2017-09-25 — End: ?
  Filled 2017-09-25: qty 10

## 2017-09-25 MED ORDER — OXYCODONE HCL 5 MG PO TABS: 5.0000 mg | ORAL_TABLET | Freq: Once | ORAL | Status: DC | PRN

## 2017-09-25 MED ORDER — SODIUM CHLORIDE 0.9 % IR SOLN
Status: DC | PRN
  Administered 2017-09-25: 1000 mL

## 2017-09-25 MED ORDER — CEFAZOLIN SODIUM-DEXTROSE 2-4 GM/100ML-% IV SOLN: 2.0000 g | INTRAVENOUS | Status: DC

## 2017-09-25 MED ORDER — STERILE WATER FOR IRRIGATION IR SOLN
Status: DC | PRN
  Administered 2017-09-25: 1000 mL

## 2017-09-25 MED ORDER — ONDANSETRON HCL 4 MG/2ML IJ SOLN
INTRAMUSCULAR | Status: DC | PRN
  Administered 2017-09-25: 4 mg via INTRAVENOUS

## 2017-09-25 MED ORDER — PROPOFOL 10 MG/ML IV BOLUS
INTRAVENOUS | Status: DC | PRN
  Administered 2017-09-25: 150 mg via INTRAVENOUS

## 2017-09-25 MED ORDER — ACETAMINOPHEN 160 MG/5ML PO SOLN: 325.0000 mg | ORAL | Status: DC | PRN

## 2017-09-25 MED ORDER — OXYCODONE HCL 5 MG/5ML PO SOLN
5.0000 mg | Freq: Once | ORAL | Status: DC | PRN
  Filled 2017-09-25: qty 5

## 2017-09-25 MED ORDER — ACETAMINOPHEN 325 MG PO TABS: 325.0000 mg | ORAL_TABLET | ORAL | Status: DC | PRN

## 2017-09-25 MED ORDER — LIDOCAINE 2% (20 MG/ML) 5 ML SYRINGE
INTRAMUSCULAR | Status: DC | PRN
  Administered 2017-09-25: 80 mg via INTRAVENOUS

## 2017-09-25 MED ORDER — FENTANYL CITRATE (PF) 100 MCG/2ML IJ SOLN: 25.0000 ug | INTRAMUSCULAR | Status: DC | PRN

## 2017-09-25 MED ORDER — EPHEDRINE SULFATE-NACL 50-0.9 MG/10ML-% IV SOSY
PREFILLED_SYRINGE | INTRAVENOUS | Status: DC | PRN
  Administered 2017-09-25 (×2): 10 mg via INTRAVENOUS

## 2017-09-25 MED ORDER — PROPOFOL 10 MG/ML IV BOLUS
INTRAVENOUS | Status: AC
  Filled 2017-09-25: qty 20

## 2017-09-25 MED ORDER — FENTANYL CITRATE (PF) 100 MCG/2ML IJ SOLN
INTRAMUSCULAR | Status: AC
  Filled 2017-09-25: qty 2

## 2017-09-25 MED ORDER — LACTATED RINGERS IV SOLN
INTRAVENOUS | Status: DC
  Administered 2017-09-25 (×2): via INTRAVENOUS

## 2017-09-25 MED ORDER — PHENYLEPHRINE 40 MCG/ML (10ML) SYRINGE FOR IV PUSH (FOR BLOOD PRESSURE SUPPORT)
PREFILLED_SYRINGE | INTRAVENOUS | Status: AC
  Filled 2017-09-25: qty 10

## 2017-09-25 SURGICAL SUPPLY — 19 items
BAG URO CATCHER STRL LF (MISCELLANEOUS) ×3 IMPLANT
CATH FOLEY 2WAY SLVR  5CC 20FR (CATHETERS) ×2
CATH FOLEY 2WAY SLVR 5CC 20FR (CATHETERS) ×1 IMPLANT
CLOTH BEACON ORANGE TIMEOUT ST (SAFETY) ×3 IMPLANT
COVER FOOTSWITCH UNIV (MISCELLANEOUS) ×3 IMPLANT
COVER SURGICAL LIGHT HANDLE (MISCELLANEOUS) IMPLANT
FIBER LASER FLEXIVA 1000 (UROLOGICAL SUPPLIES) ×3 IMPLANT
FIBER LASER FLEXIVA 365 (UROLOGICAL SUPPLIES) IMPLANT
FIBER LASER FLEXIVA 550 (UROLOGICAL SUPPLIES) IMPLANT
FIBER LASER TRAC TIP (UROLOGICAL SUPPLIES) IMPLANT
GLOVE BIOGEL M 8.0 STRL (GLOVE) ×3 IMPLANT
GOWN STRL REUS W/TWL LRG LVL3 (GOWN DISPOSABLE) ×6 IMPLANT
MANIFOLD NEPTUNE II (INSTRUMENTS) ×3 IMPLANT
PACK CYSTO (CUSTOM PROCEDURE TRAY) ×3 IMPLANT
SYRINGE IRR TOOMEY STRL 70CC (SYRINGE) IMPLANT
TUBING CONNECTING 10 (TUBING) ×2 IMPLANT
TUBING CONNECTING 10' (TUBING) ×1
TUBING INSUFFLATION 10FT LAP (TUBING) IMPLANT
TUBING UROLOGY SET (TUBING) ×3 IMPLANT

## 2017-09-25 NOTE — Anesthesia Preprocedure Evaluation (Addendum)
Anesthesia Evaluation  Patient identified by MRN, date of birth, ID band Patient awake    Reviewed: Allergy & Precautions, NPO status , Patient's Chart, lab work & pertinent test results, reviewed documented beta blocker date and time   History of Anesthesia Complications Negative for: history of anesthetic complications  Airway Mallampati: I  TM Distance: >3 FB Neck ROM: Full    Dental  (+) Upper Dentures, Lower Dentures   Pulmonary neg shortness of breath, neg COPD, neg recent URI, former smoker,    breath sounds clear to auscultation       Cardiovascular hypertension, Pt. on medications and Pt. on home beta blockers + CAD and + Peripheral Vascular Disease   Rhythm:Regular     Neuro/Psych negative neurological ROS  negative psych ROS   GI/Hepatic negative GI ROS, Neg liver ROS,   Endo/Other    Renal/GU Renal disease     Musculoskeletal   Abdominal   Peds  Hematology  (+) anemia ,   Anesthesia Other Findings   Reproductive/Obstetrics                             Anesthesia Physical Anesthesia Plan  ASA: II  Anesthesia Plan: General   Post-op Pain Management:    Induction: Intravenous  PONV Risk Score and Plan: 2 and Ondansetron  Airway Management Planned: LMA  Additional Equipment: None  Intra-op Plan:   Post-operative Plan: Extubation in OR  Informed Consent: I have reviewed the patients History and Physical, chart, labs and discussed the procedure including the risks, benefits and alternatives for the proposed anesthesia with the patient or authorized representative who has indicated his/her understanding and acceptance.   Dental advisory given  Plan Discussed with: CRNA and Surgeon  Anesthesia Plan Comments:         Anesthesia Quick Evaluation

## 2017-09-25 NOTE — Anesthesia Procedure Notes (Signed)
Procedure Name: LMA Insertion Date/Time: 09/25/2017 11:06 AM Performed by: [place] Jaysin Gayler C, CRNA Pre-anesthesia Checklist: Patient identified, Emergency Drugs available, Suction available and Patient being monitored Patient Re-evaluated:Patient Re-evaluated prior to induction Oxygen Delivery Method: Circle system utilized Preoxygenation: Pre-oxygenation with 100% oxygen Induction Type: IV induction Ventilation: Mask ventilation without difficulty LMA: LMA inserted LMA Size: 4.0 Number of attempts: 1 Airway Equipment and Method: Bite block Placement Confirmation: positive ETCO2 Tube secured with: Tape Dental Injury: Teeth and Oropharynx as per pre-operative assessment

## 2017-09-25 NOTE — Discharge Instructions (Signed)
Indwelling Urinary Catheter Care, Adult  Take good care of your catheter to keep it working and to prevent problems.  How to wear your catheter  Attach your catheter to your leg with tape (adhesive tape) or a leg strap. Make sure it is not too tight. If you use tape, remove any bits of tape that are already on the catheter.  How to wear a drainage bag  You should have:   A large overnight bag.   A small leg bag.    Overnight Bag  You may wear the overnight bag at any time. Always keep the bag below the level of your bladder but off the floor. When you sleep, put a clean plastic bag in a wastebasket. Then hang the bag inside the wastebasket.  Leg Bag  Never wear the leg bag at night. Always wear the leg bag below your knee. Keep the leg bag secure with a leg strap or tape.  How to care for your skin   Clean the skin around the catheter at least once every day.   Shower every day. Do not take baths.   Put creams, lotions, or ointments on your genital area only as told by your doctor.   Do not use powders, sprays, or lotions on your genital area.  How to clean your catheter and your skin  1. Wash your hands with soap and water.  2. Wet a washcloth in warm water and gentle (mild) soap.  3. Use the washcloth to clean the skin where the catheter enters your body. Clean downward and wipe away from the catheter in small circles. Do not wipe toward the catheter.  4. Pat the area dry with a clean towel. Make sure to clean off all soap.  How to care for your drainage bags  Empty your drainage bag when it is ?- full or at least 2-3 times a day. Replace your drainage bag once a month or sooner if it starts to smell bad or look dirty. Do not clean your drainage bag unless told by your doctor.  Emptying a drainage bag    Supplies Needed   Rubbing alcohol.   Gauze pad or cotton ball.   Tape or a leg strap.    Steps  1. Wash your hands with soap and water.  2. Separate (detach) the bag from your leg.  3. Hold the bag over  the toilet or a clean container. Keep the bag below your hips and bladder. This stops pee (urine) from going back into the tube.  4. Open the pour spout at the bottom of the bag.  5. Empty the pee into the toilet or container. Do not let the pour spout touch any surface.  6. Put rubbing alcohol on a gauze pad or cotton ball.  7. Use the gauze pad or cotton ball to clean the pour spout.  8. Close the pour spout.  9. Attach the bag to your leg with tape or a leg strap.  10. Wash your hands.    Changing a drainage bag  Supplies Needed   Alcohol wipes.   A clean drainage bag.   Adhesive tape or a leg strap.    Steps  1. Wash your hands with soap and water.  2. Separate the dirty bag from your leg.  3. Pinch the rubber catheter with your fingers so that pee does not spill out.  4. Separate the catheter tube from the drainage tube where these tubes connect (at the   connection valve). Do not let the tubes touch any surface.  5. Clean the end of the catheter tube with an alcohol wipe. Use a different alcohol wipe to clean the end of the drainage tube.  6. Connect the catheter tube to the drainage tube of the clean bag.  7. Attach the new bag to the leg with adhesive tape or a leg strap.  8. Wash your hands.    How to prevent infection and other problems   Never pull on your catheter or try to remove it. Pulling can damage tissue in your body.   Always wash your hands before and after touching your catheter.   If a leg strap gets wet, replace it with a dry one.   Drink enough fluids to keep your pee clear or pale yellow, or as told by your doctor.   Do not let the drainage bag or tubing touch the floor.   Wear cotton underwear.   If you are male, wipe from front to back after you poop (have a bowel movement).   Check on the catheter often to make sure it works and the tubing is not twisted.  Get help if:   Your pee is cloudy.   Your pee smells unusually bad.   Your pee is not draining into the bag.   Your  tube gets clogged.   Your catheter starts to leak.   Your bladder feels full.  Get help right away if:   You have redness, swelling, or pain where the catheter enters your body.   You have fluid, pus, or a bad smell coming from the area where the catheter enters your body.   The area where the catheter enters your body feels warm.   You have a fever.   You have pain in your:  ? Stomach (abdomen).  ? Legs.  ? Lower back.  ? Bladder.   You see blood fill the catheter.   Your pee is pink or red.   You feel sick to your stomach (nauseous).   You throw up (vomit).   You have chills.   Your catheter gets pulled out.  This information is not intended to replace advice given to you by your health care provider. Make sure you discuss any questions you have with your health care provider.  Document Released: 11/18/2012 Document Revised: 06/21/2016 Document Reviewed: 01/06/2014  Elsevier Interactive Patient Education  2018 [employer]

## 2017-09-25 NOTE — H&P (View-Only) (Signed)
Urology Admission H&P  Chief Complaint: hematuria  History of Present Illness: Brett [name] is a 77yo with BPH and gross hematuria found to have a large bladder calculus on Ct and confirmed with office cystoscopy.  He has severe LUTS with dysuria. He has intermittent gross hematuria  Past Medical History:  Diagnosis Date  . Carotid stenosis   . Elevated PSA   . 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 Glomerulonephritis 1961  . Hyperglycemia   . Hyperlipidemia   . Hyperplastic colon polyp 11/24/2009   x7  . Hypertension   . Iron deficiency anemia due to chronic blood loss 08/15/2016  . Leukocytosis 08/02/2016  . 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  . PVD (peripheral vascular disease) ([place])   . 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 BYPASS GRAFT  2006  . CORONARY ARTERY BYPASS GRAFT  12/07/2004   CABG x5 Dr [name]  . RIGHT HEART CATH AND CORONARY/GRAFT ANGIOGRAPHY N/A 11/21/2016   Procedure: Right Heart Cath and Coronary/Graft Angiography;  Surgeon: Sherren Mocha, MD;  Location: [facility];  Service: Cardiovascular;  Laterality: N/A;  . TEE WITHOUT CARDIOVERSION N/A 12/05/2016   Procedure: TRANSESOPHAGEAL ECHOCARDIOGRAM (TEE);  Surgeon: Sherren Mocha, MD;  Location: [facility];  Service: Open Heart Surgery;  Laterality: N/A;  . TRANSCATHETER AORTIC VALVE REPLACEMENT, TRANSFEMORAL N/A 12/05/2016   Procedure: TRANSCATHETER AORTIC VALVE REPLACEMENT, TRANSFEMORAL;  Surgeon: Sherren Mocha, MD;  Location: [facility];  Service: Open Heart Surgery;  Laterality: N/A;    Home Medications:  Current Facility-Administered Medications  Medication Dose Route Frequency Provider Last Rate Last Dose   . ceFAZolin (ANCEF) IVPB 2g/100 mL premix  2 g Intravenous 30 min Pre-Op Berneita Sanagustin, Candee Furbish, MD      . lactated ringers infusion   Intravenous Continuous Oleta Mouse, MD 50 mL/hr at 09/25/17 0920     Allergies: No Known Allergies  Family History  Problem Relation Age of Onset  . Heart attack Father   . Dementia Mother   . Colon cancer Neg Hx   . Colon polyps Neg Hx   . Rectal cancer Neg Hx   . Stomach cancer Neg Hx    Social History:  reports that he quit smoking about 20 months ago. His smoking use included cigarettes. He has a 40.00 pack-year smoking history. he has never used smokeless tobacco. He reports that he does not drink alcohol or use drugs.  Review of Systems  Genitourinary: Positive for dysuria, frequency, hematuria and urgency.  All other systems reviewed and are negative.   Physical Exam:  Vital signs in last 24 hours: Temp:  [97.8 F (36.6 C)] 97.8 F (36.6 C) (02/19 0836) Pulse Rate:  [100] 100 (02/19 0836) Resp:  [16] 16 (02/19 0836) BP: (118)/(97) 118/97 (02/19 0837) SpO2:  [100 %] 100 % (02/19 0836) Weight:  [65.5 kg (144 lb 6.4 oz)] 65.5 kg (144 lb 6.4 oz) (02/19 0929) Physical Exam  Constitutional: He is oriented to person, place, and time. He appears well-developed and well-nourished.  HENT:  Head: Normocephalic and atraumatic.  Eyes: EOM are normal. Pupils are equal, round, and reactive to light.  Neck: Normal range of motion. No thyromegaly present.  Cardiovascular:  Normal rate and regular rhythm.  Respiratory: Effort normal. No respiratory distress.  GI: Soft. He exhibits no distension.  Musculoskeletal: Normal range of motion. He exhibits no edema.  Neurological: He is alert and oriented to person, place, and time.  Skin: Skin is warm and dry.  Psychiatric: He has a normal mood and affect. His behavior is normal. Judgment and thought content normal.    Laboratory Data:  No results found for this or any previous visit (from the past  24 hour(s)). No results found for this or any previous visit (from the past 240 hour(s)). Creatinine: Recent Labs    09/20/17 1045  CREATININE 1.21   Baseline Creatinine: 1.2  Impression/Assessment:  76yo with bladder calculus  Plan:  The risks/benefits/alternatives to cystolithalopaxy was explained to the patient and he understands and wishes to proceed with surgery  Nicolette Bang 09/25/2017, 10:48 AM

## 2017-09-25 NOTE — Transfer of Care (Signed)
Immediate Anesthesia Transfer of Care Note  Patient: Brett [name]  Procedure(s) Performed: CYSTOSCOPY WITH LITHOLAPAXY (N/A ) HOLMIUM LASER APPLICATION (N/A )  Patient Location: PACU  Anesthesia Type:General  Level of Consciousness: awake, alert  and oriented  Airway & Oxygen Therapy: Patient Spontanous Breathing and Patient connected to face mask oxygen  Post-op Assessment: Report given to RN and Post -op Vital signs reviewed and stable  Post vital signs: Reviewed and stable  Last Vitals:  Vitals:   09/25/17 0836 09/25/17 0837  BP:  (!) 118/97  Pulse: 100   Resp: 16   Temp: 36.6 C   SpO2: 100%     Last Pain:  Vitals:   09/25/17 0836  TempSrc: Oral      Patients Stated Pain Goal: 4 (94/80/16 5537)  Complications: No apparent anesthesia complications

## 2017-09-25 NOTE — H&P (Signed)
Urology Admission H&P  Chief Complaint: hematuria  History of Present Illness: Mr Brett [name] is a 77yo with BPH and gross hematuria found to have a large bladder calculus on Ct and confirmed with office cystoscopy.  He has severe LUTS with dysuria. He has intermittent gross hematuria  Past Medical History:  Diagnosis Date  . Carotid stenosis   . Elevated PSA   . 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 Glomerulonephritis 1961  . Hyperglycemia   . Hyperlipidemia   . Hyperplastic colon polyp 11/24/2009   x7  . Hypertension   . Iron deficiency anemia due to chronic blood loss 08/15/2016  . Leukocytosis 08/02/2016  . 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  . PVD (peripheral vascular disease) ([place])   . 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 BYPASS GRAFT  2006  . CORONARY ARTERY BYPASS GRAFT  12/07/2004   CABG x5 Dr [name]  . RIGHT HEART CATH AND CORONARY/GRAFT ANGIOGRAPHY N/A 11/21/2016   Procedure: Right Heart Cath and Coronary/Graft Angiography;  Surgeon: Sherren Mocha, MD;  Location: [facility];  Service: Cardiovascular;  Laterality: N/A;  . TEE WITHOUT CARDIOVERSION N/A 12/05/2016   Procedure: TRANSESOPHAGEAL ECHOCARDIOGRAM (TEE);  Surgeon: Sherren Mocha, MD;  Location: [facility];  Service: Open Heart Surgery;  Laterality: N/A;  . TRANSCATHETER AORTIC VALVE REPLACEMENT, TRANSFEMORAL N/A 12/05/2016   Procedure: TRANSCATHETER AORTIC VALVE REPLACEMENT, TRANSFEMORAL;  Surgeon: Sherren Mocha, MD;  Location: [facility];  Service: Open Heart Surgery;  Laterality: N/A;    Home Medications:  Current Facility-Administered Medications  Medication Dose Route Frequency Provider Last Rate Last Dose   . ceFAZolin (ANCEF) IVPB 2g/100 mL premix  2 g Intravenous 30 min Pre-Op Brett [name], Candee Furbish, MD      . lactated ringers infusion   Intravenous Continuous Oleta Mouse, MD 50 mL/hr at 09/25/17 0920     Allergies: No Known Allergies  Family History  Problem Relation Age of Onset  . Heart attack Father   . Dementia Mother   . Colon cancer Neg Hx   . Colon polyps Neg Hx   . Rectal cancer Neg Hx   . Stomach cancer Neg Hx    Social History:  reports that he quit smoking about 20 months ago. His smoking use included cigarettes. He has a 40.00 pack-year smoking history. he has never used smokeless tobacco. He reports that he does not drink alcohol or use drugs.  Review of Systems  Genitourinary: Positive for dysuria, frequency, hematuria and urgency.  All other systems reviewed and are negative.   Physical Exam:  Vital signs in last 24 hours: Temp:  [97.8 F (36.6 C)] 97.8 F (36.6 C) (02/19 0836) Pulse Rate:  [100] 100 (02/19 0836) Resp:  [16] 16 (02/19 0836) BP: (118)/(97) 118/97 (02/19 0837) SpO2:  [100 %] 100 % (02/19 0836) Weight:  [65.5 kg (144 lb 6.4 oz)] 65.5 kg (144 lb 6.4 oz) (02/19 0929) Physical Exam  Constitutional: He is oriented to person, place, and time. He appears well-developed and well-nourished.  HENT:  Head: Normocephalic and atraumatic.  Eyes: EOM are normal. Pupils are equal, round, and reactive to light.  Neck: Normal range of motion. No thyromegaly present.  Cardiovascular:  Normal rate and regular rhythm.  Respiratory: Effort normal. No respiratory distress.  GI: Soft. He exhibits no distension.  Musculoskeletal: Normal range of motion. He exhibits no edema.  Neurological: He is alert and oriented to person, place, and time.  Skin: Skin is warm and dry.  Psychiatric: He has a normal mood and affect. His behavior is normal. Judgment and thought content normal.    Laboratory Data:  No results found for this or any previous visit (from the past  24 hour(s)). No results found for this or any previous visit (from the past 240 hour(s)). Creatinine: Recent Labs    09/20/17 1045  CREATININE 1.21   Baseline Creatinine: 1.2  Impression/Assessment:  76yo with bladder calculus  Plan:  The risks/benefits/alternatives to cystolithalopaxy was explained to the patient and he understands and wishes to proceed with surgery  Nicolette Bang 09/25/2017, 10:48 AM

## 2017-09-25 NOTE — Op Note (Signed)
Preoperative diagnosis: bladder calculus  Postoperative diagnosis: same  Procedure: 1 cystoscopy 2. cystolithalopaxy for a stone greater than 2.5cm  Attending: Nicolette Bang  Anesthesia: General  Estimated blood loss: Minimal  Drains: 20 french foley  Specimens: 1. Bladder calculus  Antibiotics: ancef  Findings:  Ureteral orifices in normal anatomic location.  3cm bladder calculus  Indications: Patient is a 77 year old male with a history of BPH and bladder calculus.  After discussing treatment options, they decided proceed with cystolithalopaxy.  Procedure her in detail: The patient was brought to the operating room and a brief timeout was done to ensure correct patient, correct procedure, correct site.  General anesthesia was administered patient was placed in dorsal lithotomy position.  Their genitalia was then prepped and draped in usual sterile fashion.  A rigid 13 French cystoscope was passed in the urethra and the bladder.  Bladder was inspected and we noted no masses or lesions.  the ureteral orifices were in the normal orthotopic locations. Using the 1000nm laser fiber the bladder calculus was fragmented and the fragments were then irrigated from the bladder. The stone fragments were the sent for analysis. We then placed a 20 french foley. The bladder was then drained and this concluded the procedure which was well tolerated by patient.  Complications: None  Condition: Stable, extubated, transferred to PACU  Plan: Patient is to be discharge home and followup in 3 days for a voiding trial. He will followup in 2 weeks for stone analysis discussion

## 2017-09-26 NOTE — Anesthesia Postprocedure Evaluation (Signed)
Anesthesia Post Note  Patient: JUNIOUS RAGONE  Procedure(s) Performed: CYSTOSCOPY WITH LITHOLAPAXY (N/A ) HOLMIUM LASER APPLICATION (N/A )     Patient location during evaluation: PACU Anesthesia Type: General Level of consciousness: awake and alert Pain management: pain level controlled Vital Signs Assessment: post-procedure vital signs reviewed and stable Respiratory status: spontaneous breathing, nonlabored ventilation, respiratory function stable and patient connected to nasal cannula oxygen Cardiovascular status: blood pressure returned to baseline and stable Postop Assessment: no apparent nausea or vomiting Anesthetic complications: no    Last Vitals:  Vitals:   09/25/17 1230 09/25/17 1343  BP: (!) 149/56 138/60  Pulse: 73 75  Resp: 15 18  Temp: (!) 36.4 C 36.4 C  SpO2: 97% 95%    Last Pain:  Vitals:   09/25/17 0836  TempSrc: Oral                 Tenoch Mcclure

## 2017-09-28 DIAGNOSIS — R351 Nocturia: Secondary | ICD-10-CM | POA: Diagnosis not present

## 2017-09-28 DIAGNOSIS — N401 Enlarged prostate with lower urinary tract symptoms: Secondary | ICD-10-CM | POA: Diagnosis not present

## 2017-09-28 DIAGNOSIS — N200 Calculus of kidney: Secondary | ICD-10-CM | POA: Diagnosis not present

## 2017-10-02 ENCOUNTER — Encounter: Payer: Self-pay | Admitting: Family Medicine

## 2017-10-03 ENCOUNTER — Other Ambulatory Visit: Payer: Self-pay | Admitting: Urology

## 2017-10-03 ENCOUNTER — Other Ambulatory Visit: Payer: Self-pay

## 2017-10-03 ENCOUNTER — Encounter (HOSPITAL_COMMUNITY): Payer: Self-pay | Admitting: *Deleted

## 2017-10-04 ENCOUNTER — Other Ambulatory Visit: Payer: Self-pay | Admitting: Urology

## 2017-10-05 DIAGNOSIS — N210 Calculus in bladder: Secondary | ICD-10-CM | POA: Diagnosis not present

## 2017-10-09 ENCOUNTER — Other Ambulatory Visit: Payer: Self-pay | Admitting: Radiology

## 2017-10-11 ENCOUNTER — Ambulatory Visit (HOSPITAL_COMMUNITY): Payer: Medicare Other | Admitting: Certified Registered Nurse Anesthetist

## 2017-10-11 ENCOUNTER — Ambulatory Visit (HOSPITAL_COMMUNITY)
Admission: RE | Admit: 2017-10-11 | Discharge: 2017-10-11 | Disposition: A | Discharge status: Home / Self Care | Payer: Medicare Other | Source: Ambulatory Visit | Attending: Urology | Admitting: Urology

## 2017-10-11 ENCOUNTER — Ambulatory Visit (HOSPITAL_COMMUNITY): Payer: Medicare Other

## 2017-10-11 ENCOUNTER — Encounter (HOSPITAL_COMMUNITY): Payer: Self-pay | Admitting: *Deleted

## 2017-10-11 ENCOUNTER — Other Ambulatory Visit: Payer: Self-pay

## 2017-10-11 ENCOUNTER — Encounter (HOSPITAL_COMMUNITY)
Admission: RE | Disposition: A | Discharge status: Home / Self Care | Payer: Self-pay | Source: Ambulatory Visit | Attending: Urology

## 2017-10-11 ENCOUNTER — Observation Stay (HOSPITAL_COMMUNITY)
Admission: RE | Admit: 2017-10-11 | Discharge: 2017-10-12 | Disposition: A | Discharge status: Home / Self Care | Payer: Medicare Other | Source: Ambulatory Visit | Attending: Urology | Admitting: Urology

## 2017-10-11 DIAGNOSIS — Z951 Presence of aortocoronary bypass graft: Secondary | ICD-10-CM | POA: Insufficient documentation

## 2017-10-11 DIAGNOSIS — I2510 Atherosclerotic heart disease of native coronary artery without angina pectoris: Secondary | ICD-10-CM | POA: Insufficient documentation

## 2017-10-11 DIAGNOSIS — N200 Calculus of kidney: Secondary | ICD-10-CM | POA: Diagnosis not present

## 2017-10-11 DIAGNOSIS — I25810 Atherosclerosis of coronary artery bypass graft(s) without angina pectoris: Secondary | ICD-10-CM | POA: Diagnosis not present

## 2017-10-11 DIAGNOSIS — N400 Enlarged prostate without lower urinary tract symptoms: Secondary | ICD-10-CM | POA: Insufficient documentation

## 2017-10-11 DIAGNOSIS — I10 Essential (primary) hypertension: Secondary | ICD-10-CM | POA: Insufficient documentation

## 2017-10-11 DIAGNOSIS — Z87891 Personal history of nicotine dependence: Secondary | ICD-10-CM | POA: Insufficient documentation

## 2017-10-11 DIAGNOSIS — E785 Hyperlipidemia, unspecified: Secondary | ICD-10-CM | POA: Diagnosis not present

## 2017-10-11 DIAGNOSIS — I739 Peripheral vascular disease, unspecified: Secondary | ICD-10-CM | POA: Insufficient documentation

## 2017-10-11 DIAGNOSIS — R972 Elevated prostate specific antigen [PSA]: Secondary | ICD-10-CM | POA: Diagnosis not present

## 2017-10-11 HISTORY — PX: IR URETERAL STENT LEFT NEW ACCESS W/O SEP NEPHROSTOMY CATH: IMG6075

## 2017-10-11 HISTORY — PX: HOLMIUM LASER APPLICATION: SHX5852

## 2017-10-11 HISTORY — PX: NEPHROLITHOTOMY: SHX5134

## 2017-10-11 LAB — PROTIME-INR
INR: 1.03
PROTHROMBIN TIME: 13.4 s (ref 11.4–15.2)

## 2017-10-11 LAB — CBC
HCT: 35.3 % — ABNORMAL LOW (ref 39.0–52.0)
Hemoglobin: 12.1 g/dL — ABNORMAL LOW (ref 13.0–17.0)
MCH: 33.5 pg (ref 26.0–34.0)
MCHC: 34.3 g/dL (ref 30.0–36.0)
MCV: 97.8 fL (ref 78.0–100.0)
Platelets: 173 10*3/uL (ref 150–400)
RBC: 3.61 MIL/uL — AB (ref 4.22–5.81)
RDW: 13.7 % (ref 11.5–15.5)
WBC: 14.2 10*3/uL — AB (ref 4.0–10.5)

## 2017-10-11 LAB — CBC WITH DIFFERENTIAL/PLATELET
Basophils Absolute: 0 10*3/uL (ref 0.0–0.1)
Basophils Relative: 0 %
EOS ABS: 0.1 10*3/uL (ref 0.0–0.7)
EOS PCT: 1 %
HCT: 41.9 % (ref 39.0–52.0)
HEMOGLOBIN: 14.8 g/dL (ref 13.0–17.0)
Lymphocytes Relative: 21 %
Lymphs Abs: 1.8 10*3/uL (ref 0.7–4.0)
MCH: 34.3 pg — ABNORMAL HIGH (ref 26.0–34.0)
MCHC: 35.3 g/dL (ref 30.0–36.0)
MCV: 97.2 fL (ref 78.0–100.0)
MONOS PCT: 9 %
Monocytes Absolute: 0.8 10*3/uL (ref 0.1–1.0)
NEUTROS PCT: 69 %
Neutro Abs: 5.8 10*3/uL (ref 1.7–7.7)
PLATELETS: 221 10*3/uL (ref 150–400)
RBC: 4.31 MIL/uL (ref 4.22–5.81)
RDW: 13.8 % (ref 11.5–15.5)
WBC: 8.5 10*3/uL (ref 4.0–10.5)

## 2017-10-11 LAB — BASIC METABOLIC PANEL
ANION GAP: 6 (ref 5–15)
Anion gap: 9 (ref 5–15)
BUN: 20 mg/dL (ref 6–20)
BUN: 19 mg/dL (ref 6–20)
CALCIUM: 9.5 mg/dL (ref 8.9–10.3)
CHLORIDE: 108 mmol/L (ref 101–111)
CO2: 25 mmol/L (ref 22–32)
CO2: 27 mmol/L (ref 22–32)
CREATININE: 1.1 mg/dL (ref 0.61–1.24)
Calcium: 8.7 mg/dL — ABNORMAL LOW (ref 8.9–10.3)
Chloride: 108 mmol/L (ref 101–111)
Creatinine, Ser: 1.07 mg/dL (ref 0.61–1.24)
GFR calc Af Amer: >60 mL/min (ref >60)
GFR calc non Af Amer: >60 mL/min (ref >60)
Glucose, Bld: 108 mg/dL — ABNORMAL HIGH (ref 65–99)
Glucose, Bld: 169 mg/dL — ABNORMAL HIGH (ref 65–99)
POTASSIUM: 4.5 mmol/L (ref 3.5–5.1)
Potassium: 3.7 mmol/L (ref 3.5–5.1)
SODIUM: 141 mmol/L (ref 135–145)
Sodium: 142 mmol/L (ref 135–145)

## 2017-10-11 SURGERY — NEPHROLITHOTOMY PERCUTANEOUS
Anesthesia: General | Laterality: Left

## 2017-10-11 MED ORDER — IOPAMIDOL (ISOVUE-300) INJECTION 61%
INTRAVENOUS | Status: AC
  Administered 2017-10-11: 100 mL via INTRAVENOUS
  Filled 2017-10-11: qty 100

## 2017-10-11 MED ORDER — ACETAMINOPHEN 325 MG PO TABS: 650.0000 mg | ORAL_TABLET | ORAL | Status: DC | PRN

## 2017-10-11 MED ORDER — ONDANSETRON HCL 4 MG/2ML IJ SOLN
INTRAMUSCULAR | Status: AC
  Filled 2017-10-11: qty 2

## 2017-10-11 MED ORDER — BELLADONNA ALKALOIDS-OPIUM 16.2-60 MG RE SUPP
1.0000 | Freq: Four times a day (QID) | RECTAL | Status: DC | PRN
  Filled 2017-10-11: qty 1

## 2017-10-11 MED ORDER — FINASTERIDE 5 MG PO TABS
5.0000 mg | ORAL_TABLET | Freq: Every day | ORAL | Status: DC
  Administered 2017-10-11 – 2017-10-12 (×2): 5 mg via ORAL
  Filled 2017-10-11 (×2): qty 1

## 2017-10-11 MED ORDER — ZOLPIDEM TARTRATE 5 MG PO TABS: 5.0000 mg | ORAL_TABLET | Freq: Every evening | ORAL | Status: DC | PRN

## 2017-10-11 MED ORDER — SUGAMMADEX SODIUM 200 MG/2ML IV SOLN
INTRAVENOUS | Status: DC | PRN
  Administered 2017-10-11: 130 mg via INTRAVENOUS

## 2017-10-11 MED ORDER — IOPAMIDOL (ISOVUE-300) INJECTION 61%
INTRAVENOUS | Status: AC
  Administered 2017-10-11: 40 mL
  Filled 2017-10-11: qty 50

## 2017-10-11 MED ORDER — LIDOCAINE 2% (20 MG/ML) 5 ML SYRINGE
INTRAMUSCULAR | Status: DC | PRN
  Administered 2017-10-11: 60 mg via INTRAVENOUS

## 2017-10-11 MED ORDER — MIDAZOLAM HCL 2 MG/2ML IJ SOLN
INTRAMUSCULAR | Status: AC
  Filled 2017-10-11: qty 2

## 2017-10-11 MED ORDER — MIDAZOLAM HCL 2 MG/2ML IJ SOLN
INTRAMUSCULAR | Status: AC | PRN
  Administered 2017-10-11 (×2): 0.5 mg via INTRAVENOUS
  Administered 2017-10-11 (×2): 1 mg via INTRAVENOUS
  Administered 2017-10-11: 0.5 mg via INTRAVENOUS
  Administered 2017-10-11: 1 mg via INTRAVENOUS

## 2017-10-11 MED ORDER — DEXAMETHASONE SODIUM PHOSPHATE 10 MG/ML IJ SOLN
INTRAMUSCULAR | Status: DC | PRN
  Administered 2017-10-11: 10 mg via INTRAVENOUS

## 2017-10-11 MED ORDER — HYDROMORPHONE HCL 1 MG/ML IJ SOLN: 0.2500 mg | INTRAMUSCULAR | Status: DC | PRN

## 2017-10-11 MED ORDER — IOPAMIDOL (ISOVUE-300) INJECTION 61%
50.0000 mL | Freq: Once | INTRAVENOUS | Status: AC | PRN
  Administered 2017-10-11: 40 mL

## 2017-10-11 MED ORDER — ATORVASTATIN CALCIUM 40 MG PO TABS
40.0000 mg | ORAL_TABLET | Freq: Every day | ORAL | Status: DC
  Administered 2017-10-11 – 2017-10-12 (×2): 40 mg via ORAL
  Filled 2017-10-11 (×2): qty 1

## 2017-10-11 MED ORDER — HYDROMORPHONE HCL 1 MG/ML IJ SOLN
0.5000 mg | INTRAMUSCULAR | Status: DC | PRN
  Administered 2017-10-11: 1 mg via INTRAVENOUS
  Filled 2017-10-11: qty 1

## 2017-10-11 MED ORDER — FENTANYL CITRATE (PF) 100 MCG/2ML IJ SOLN
INTRAMUSCULAR | Status: AC
  Filled 2017-10-11: qty 4

## 2017-10-11 MED ORDER — PHENYLEPHRINE HCL 10 MG/ML IJ SOLN
INTRAMUSCULAR | Status: AC
  Filled 2017-10-11: qty 1

## 2017-10-11 MED ORDER — PHENYLEPHRINE 40 MCG/ML (10ML) SYRINGE FOR IV PUSH (FOR BLOOD PRESSURE SUPPORT)
PREFILLED_SYRINGE | INTRAVENOUS | Status: DC | PRN
  Administered 2017-10-11 (×2): 120 ug via INTRAVENOUS
  Administered 2017-10-11: 80 ug via INTRAVENOUS

## 2017-10-11 MED ORDER — SODIUM CHLORIDE 0.9 % IV SOLN
INTRAVENOUS | Status: DC
  Administered 2017-10-11: 16:00:00 via INTRAVENOUS

## 2017-10-11 MED ORDER — PROPOFOL 10 MG/ML IV BOLUS
INTRAVENOUS | Status: DC | PRN
  Administered 2017-10-11: 130 mg via INTRAVENOUS

## 2017-10-11 MED ORDER — CEFAZOLIN SODIUM-DEXTROSE 2-4 GM/100ML-% IV SOLN
2.0000 g | INTRAVENOUS | Status: AC
  Administered 2017-10-11: 2 g via INTRAVENOUS

## 2017-10-11 MED ORDER — ASPIRIN EC 81 MG PO TBEC
81.0000 mg | DELAYED_RELEASE_TABLET | Freq: Every day | ORAL | Status: DC
  Administered 2017-10-11 – 2017-10-12 (×2): 81 mg via ORAL
  Filled 2017-10-11 (×2): qty 1

## 2017-10-11 MED ORDER — ARTIFICIAL TEARS OP OINT
TOPICAL_OINTMENT | OPHTHALMIC | Status: AC
  Filled 2017-10-11: qty 3.5

## 2017-10-11 MED ORDER — SODIUM CHLORIDE 0.9 % IR SOLN
Status: DC | PRN
  Administered 2017-10-11: 6000 mL

## 2017-10-11 MED ORDER — ROCURONIUM BROMIDE 10 MG/ML (PF) SYRINGE
PREFILLED_SYRINGE | INTRAVENOUS | Status: AC
  Filled 2017-10-11: qty 5

## 2017-10-11 MED ORDER — FENTANYL CITRATE (PF) 100 MCG/2ML IJ SOLN
INTRAMUSCULAR | Status: AC
  Filled 2017-10-11: qty 2

## 2017-10-11 MED ORDER — OXYCODONE-ACETAMINOPHEN 5-325 MG PO TABS
1.0000 | ORAL_TABLET | ORAL | Status: DC | PRN
  Administered 2017-10-11: 2 via ORAL
  Administered 2017-10-12: 1 via ORAL
  Filled 2017-10-11 (×2): qty 2

## 2017-10-11 MED ORDER — NIACIN ER (ANTIHYPERLIPIDEMIC) 500 MG PO TBCR
1000.0000 mg | EXTENDED_RELEASE_TABLET | Freq: Every day | ORAL | Status: DC
  Administered 2017-10-11: 1000 mg via ORAL
  Filled 2017-10-11: qty 2

## 2017-10-11 MED ORDER — ONDANSETRON HCL 4 MG/2ML IJ SOLN
INTRAMUSCULAR | Status: DC | PRN
  Administered 2017-10-11: 4 mg via INTRAVENOUS

## 2017-10-11 MED ORDER — CEFAZOLIN SODIUM-DEXTROSE 2-4 GM/100ML-% IV SOLN: 2.0000 g | INTRAVENOUS | Status: DC

## 2017-10-11 MED ORDER — LIDOCAINE HCL 1 % IJ SOLN
INTRAMUSCULAR | Status: AC
  Administered 2017-10-11: 20 mL
  Filled 2017-10-11: qty 20

## 2017-10-11 MED ORDER — AMLODIPINE BESYLATE 5 MG PO TABS
5.0000 mg | ORAL_TABLET | Freq: Every day | ORAL | Status: DC
  Administered 2017-10-11 – 2017-10-12 (×2): 5 mg via ORAL
  Filled 2017-10-11 (×2): qty 1

## 2017-10-11 MED ORDER — CEFAZOLIN SODIUM-DEXTROSE 2-4 GM/100ML-% IV SOLN
INTRAVENOUS | Status: AC
  Filled 2017-10-11: qty 100

## 2017-10-11 MED ORDER — ROCURONIUM BROMIDE 50 MG/5ML IV SOSY
PREFILLED_SYRINGE | INTRAVENOUS | Status: DC | PRN
  Administered 2017-10-11: 50 mg via INTRAVENOUS

## 2017-10-11 MED ORDER — ALFUZOSIN HCL ER 10 MG PO TB24
10.0000 mg | ORAL_TABLET | Freq: Every day | ORAL | Status: DC
  Administered 2017-10-12: 10 mg via ORAL
  Filled 2017-10-11: qty 1

## 2017-10-11 MED ORDER — PROPOFOL 10 MG/ML IV BOLUS
INTRAVENOUS | Status: AC
  Filled 2017-10-11: qty 20

## 2017-10-11 MED ORDER — ONDANSETRON HCL 4 MG/2ML IJ SOLN: 4.0000 mg | INTRAMUSCULAR | Status: DC | PRN

## 2017-10-11 MED ORDER — IOHEXOL 300 MG/ML  SOLN
INTRAMUSCULAR | Status: DC | PRN
  Administered 2017-10-11: 25 mL

## 2017-10-11 MED ORDER — EPHEDRINE SULFATE-NACL 50-0.9 MG/10ML-% IV SOSY: PREFILLED_SYRINGE | INTRAVENOUS | Status: DC | PRN

## 2017-10-11 MED ORDER — FESOTERODINE FUMARATE ER 4 MG PO TB24
4.0000 mg | ORAL_TABLET | Freq: Every day | ORAL | Status: DC
Start: 2017-10-11 — End: 2017-10-12
  Administered 2017-10-11 – 2017-10-12 (×2): 4 mg via ORAL
  Filled 2017-10-11 (×2): qty 1

## 2017-10-11 MED ORDER — MIDAZOLAM HCL 2 MG/2ML IJ SOLN
INTRAMUSCULAR | Status: AC
  Filled 2017-10-11: qty 4

## 2017-10-11 MED ORDER — IOPAMIDOL (ISOVUE-300) INJECTION 61%
100.0000 mL | Freq: Once | INTRAVENOUS | Status: AC | PRN
  Administered 2017-10-11: 100 mL via INTRAVENOUS

## 2017-10-11 MED ORDER — DIPHENHYDRAMINE HCL 50 MG/ML IJ SOLN: 12.5000 mg | Freq: Four times a day (QID) | INTRAMUSCULAR | Status: DC | PRN

## 2017-10-11 MED ORDER — SUGAMMADEX SODIUM 200 MG/2ML IV SOLN
INTRAVENOUS | Status: AC
  Filled 2017-10-11: qty 2

## 2017-10-11 MED ORDER — FENTANYL CITRATE (PF) 100 MCG/2ML IJ SOLN
INTRAMUSCULAR | Status: DC | PRN
  Administered 2017-10-11 (×3): 50 ug via INTRAVENOUS

## 2017-10-11 MED ORDER — FENTANYL CITRATE (PF) 250 MCG/5ML IJ SOLN
INTRAMUSCULAR | Status: AC
  Filled 2017-10-11: qty 5

## 2017-10-11 MED ORDER — LIDOCAINE 2% (20 MG/ML) 5 ML SYRINGE
INTRAMUSCULAR | Status: AC
  Filled 2017-10-11: qty 5

## 2017-10-11 MED ORDER — PHENYLEPHRINE HCL 10 MG/ML IJ SOLN
INTRAVENOUS | Status: DC | PRN
  Administered 2017-10-11: 80 ug/min via INTRAVENOUS

## 2017-10-11 MED ORDER — DIPHENHYDRAMINE HCL 12.5 MG/5ML PO ELIX: 12.5000 mg | ORAL_SOLUTION | Freq: Four times a day (QID) | ORAL | Status: DC | PRN

## 2017-10-11 MED ORDER — METOPROLOL TARTRATE 25 MG PO TABS
25.0000 mg | ORAL_TABLET | Freq: Two times a day (BID) | ORAL | Status: DC
  Administered 2017-10-11 – 2017-10-12 (×2): 25 mg via ORAL
  Filled 2017-10-11 (×2): qty 1

## 2017-10-11 MED ORDER — FENTANYL CITRATE (PF) 100 MCG/2ML IJ SOLN
INTRAMUSCULAR | Status: AC | PRN
  Administered 2017-10-11: 25 ug via INTRAVENOUS
  Administered 2017-10-11 (×4): 50 ug via INTRAVENOUS
  Administered 2017-10-11: 25 ug via INTRAVENOUS

## 2017-10-11 MED ORDER — LACTATED RINGERS IV SOLN
INTRAVENOUS | Status: DC
  Administered 2017-10-11 (×2): via INTRAVENOUS

## 2017-10-11 MED ORDER — LORAZEPAM 1 MG PO TABS: 1.0000 mg | ORAL_TABLET | Freq: Once | ORAL | Status: DC

## 2017-10-11 SURGICAL SUPPLY — 62 items
APL SKNCLS STERI-STRIP NONHPOA (GAUZE/BANDAGES/DRESSINGS) ×2
BAG URINE DRAINAGE (UROLOGICAL SUPPLIES) ×6 IMPLANT
BASKET STONE NITINOL 3FRX115MB (UROLOGICAL SUPPLIES) IMPLANT
BASKET ZERO TIP NITINOL 2.4FR (BASKET) ×6 IMPLANT
BENZOIN TINCTURE PRP APPL 2/3 (GAUZE/BANDAGES/DRESSINGS) ×6 IMPLANT
BLADE SURG 15 STRL LF DISP TIS (BLADE) ×1 IMPLANT
BLADE SURG 15 STRL SS (BLADE) ×2
BSKT STON RTRVL ZERO TP 2.4FR (BASKET) ×2
CATH FOLEY 2W COUNCIL 20FR 5CC (CATHETERS) ×3 IMPLANT
CATH FOLEY 2W COUNCIL 5CC 18FR (CATHETERS) ×3 IMPLANT
CATH FOLEY 2WAY SLVR  5CC 16FR (CATHETERS) ×2
CATH FOLEY 2WAY SLVR  5CC 18FR (CATHETERS) ×2
CATH FOLEY 2WAY SLVR 5CC 16FR (CATHETERS) ×1 IMPLANT
CATH FOLEY 2WAY SLVR 5CC 18FR (CATHETERS) ×1 IMPLANT
CATH IMAGER II 65CM (CATHETERS) ×6 IMPLANT
CATH INTERMIT  6FR 70CM (CATHETERS) ×3 IMPLANT
CATH ROBINSON RED A/P 20FR (CATHETERS) IMPLANT
CATH URET DUAL LUMEN 6-10FR 50 (CATHETERS) IMPLANT
CATH X-FORCE N30 NEPHROSTOMY (TUBING) ×3 IMPLANT
COVER SURGICAL LIGHT HANDLE (MISCELLANEOUS) ×3 IMPLANT
DRAPE C-ARM 42X120 X-RAY (DRAPES) ×3 IMPLANT
DRAPE LINGEMAN PERC (DRAPES) ×3 IMPLANT
DRAPE SHEET LG 3/4 BI-LAMINATE (DRAPES) ×3 IMPLANT
DRAPE SURG IRRIG POUCH 19X23 (DRAPES) ×3 IMPLANT
DRSG PAD ABDOMINAL 8X10 ST (GAUZE/BANDAGES/DRESSINGS) ×6 IMPLANT
DRSG TEGADERM 8X12 (GAUZE/BANDAGES/DRESSINGS) ×6 IMPLANT
FIBER LASER FLEXIVA 1000 (UROLOGICAL SUPPLIES) ×3 IMPLANT
FIBER LASER FLEXIVA 365 (UROLOGICAL SUPPLIES) IMPLANT
FIBER LASER FLEXIVA 550 (UROLOGICAL SUPPLIES) IMPLANT
FIBER LASER TRAC TIP (UROLOGICAL SUPPLIES) IMPLANT
GAUZE SPONGE 4X4 12PLY STRL (GAUZE/BANDAGES/DRESSINGS) ×3 IMPLANT
GLOVE BIO SURGEON STRL SZ8 (GLOVE) ×9 IMPLANT
GLOVE BIOGEL PI IND STRL 8 (GLOVE) IMPLANT
GLOVE BIOGEL PI INDICATOR 8 (GLOVE)
GOWN STRL REUS W/TWL LRG LVL3 (GOWN DISPOSABLE) ×6 IMPLANT
GOWN STRL REUS W/TWL XL LVL3 (GOWN DISPOSABLE) ×3 IMPLANT
GUIDEWIRE STR DUAL SENSOR (WIRE) ×12 IMPLANT
IV SET EXTENSION CATH 6 NF (IV SETS) ×3 IMPLANT
KIT BASIN OR (CUSTOM PROCEDURE TRAY) ×3 IMPLANT
MANIFOLD NEPTUNE II (INSTRUMENTS) ×3 IMPLANT
NEEDLE TROCAR 18X15 ECHO (NEEDLE) IMPLANT
NEEDLE TROCAR 18X20 (NEEDLE) IMPLANT
NS IRRIG 1000ML POUR BTL (IV SOLUTION) ×3 IMPLANT
PACK CYSTO (CUSTOM PROCEDURE TRAY) ×3 IMPLANT
PAD ABD 8X10 STRL (GAUZE/BANDAGES/DRESSINGS) ×6 IMPLANT
PROBE LITHOCLAST ULTRA 3.8X403 (UROLOGICAL SUPPLIES) IMPLANT
PROBE PNEUMATIC 1.0MMX570MM (UROLOGICAL SUPPLIES) IMPLANT
SET AMPLATZ RENAL DILATOR (MISCELLANEOUS) IMPLANT
SET IRRIG Y TYPE TUR BLADDER L (SET/KITS/TRAYS/PACK) ×3 IMPLANT
SHEATH PEELAWAY SET 9 (SHEATH) ×3 IMPLANT
SPONGE LAP 4X18 X RAY DECT (DISPOSABLE) ×3 IMPLANT
STENT CONTOUR 6FRX26X.038 (STENTS) ×3 IMPLANT
STONE CATCHER W/TUBE ADAPTER (UROLOGICAL SUPPLIES) IMPLANT
SUT SILK 2 0 30  PSL (SUTURE) ×2
SUT SILK 2 0 30 PSL (SUTURE) ×1 IMPLANT
SYR 10ML LL (SYRINGE) ×3 IMPLANT
SYR 20CC LL (SYRINGE) ×6 IMPLANT
SYR 50ML LL SCALE MARK (SYRINGE) ×3 IMPLANT
TOWEL OR 17X26 10 PK STRL BLUE (TOWEL DISPOSABLE) ×3 IMPLANT
TUBING CONNECTING 10 (TUBING) ×4 IMPLANT
TUBING CONNECTING 10' (TUBING) ×2
WATER STERILE IRR 1000ML POUR (IV SOLUTION) ×3 IMPLANT

## 2017-10-11 NOTE — Transfer of Care (Signed)
Immediate Anesthesia Transfer of Care Note  Patient: Brett [name]  Procedure(s) Performed: NEPHROLITHOTOMY PERCUTANEOUS (Left ) HOLMIUM LASER APPLICATION (Left )  Patient Location: PACU  Anesthesia Type:General  Level of Consciousness: drowsy and patient cooperative  Airway & Oxygen Therapy: Patient Spontanous Breathing and Patient connected to face mask oxygen  Post-op Assessment: Report given to RN and Post -op Vital signs reviewed and stable  Post vital signs: Reviewed and stable  Last Vitals:  Vitals:   10/11/17 1115 10/11/17 1423  BP: (!) 148/72 126/68  Pulse: 75 (!) 54  Resp: 16 12  Temp: (!) 36.4 C (!) 36.3 C  SpO2: 99% (!) 75%    Last Pain:  Vitals:   10/11/17 1115  TempSrc: Oral  PainSc:          Complications: No apparent anesthesia complications

## 2017-10-11 NOTE — Anesthesia Procedure Notes (Signed)
Procedure Name: Intubation Date/Time: 10/11/2017 11:59 AM Performed by: Montel Clock, CRNA Pre-anesthesia Checklist: Patient identified, Emergency Drugs available, Suction available, Patient being monitored and Timeout performed Patient Re-evaluated:Patient Re-evaluated prior to induction Oxygen Delivery Method: Circle system utilized Preoxygenation: Pre-oxygenation with 100% oxygen Induction Type: IV induction Ventilation: Mask ventilation without difficulty and Oral airway inserted - appropriate to patient size Laryngoscope Size: Mac and 3 Grade View: Grade II Tube type: Oral Tube size: 7.5 mm Number of attempts: 1 Airway Equipment and Method: Stylet Placement Confirmation: ETT inserted through vocal cords under direct vision,  positive ETCO2 and breath sounds checked- equal and bilateral Secured at: 21 cm Tube secured with: Tape Dental Injury: Teeth and Oropharynx as per pre-operative assessment

## 2017-10-11 NOTE — Procedures (Signed)
  Procedure: Left perc antegrade nephroureteral catheter x2   EBL:   minimal Complications:  none immediate  See full dictation in [employer].  Dillard Cannon MD Main # [phone] Pager  [phone]

## 2017-10-11 NOTE — Anesthesia Preprocedure Evaluation (Addendum)
Anesthesia Evaluation  Patient identified by MRN, date of birth, ID band Patient awake    Reviewed: Allergy & Precautions, H&P , NPO status , Patient's Chart, lab work & pertinent test results, reviewed documented beta blocker date and time   Airway Mallampati: II  TM Distance: >3 FB Neck ROM: Full    Dental no notable dental hx. (+) Partial Upper, Partial Lower, Dental Advisory Given   Pulmonary neg pulmonary ROS, former smoker,    Pulmonary exam normal breath sounds clear to auscultation       Cardiovascular hypertension, Pt. on medications and Pt. on home beta blockers + CAD, + CABG and + Peripheral Vascular Disease   Rhythm:Regular Rate:Normal     Neuro/Psych negative neurological ROS  negative psych ROS   GI/Hepatic negative GI ROS, Neg liver ROS,   Endo/Other  negative endocrine ROS  Renal/GU Renal disease  negative genitourinary   Musculoskeletal   Abdominal   Peds  Hematology negative hematology ROS (+) anemia ,   Anesthesia Other Findings   Reproductive/Obstetrics negative OB ROS                            Anesthesia Physical Anesthesia Plan  ASA: III  Anesthesia Plan: General   Post-op Pain Management:    Induction: Intravenous  PONV Risk Score and Plan: 3 and Ondansetron, Dexamethasone and Midazolam  Airway Management Planned: Oral ETT  Additional Equipment:   Intra-op Plan:   Post-operative Plan: Extubation in OR  Informed Consent: I have reviewed the patients History and Physical, chart, labs and discussed the procedure including the risks, benefits and alternatives for the proposed anesthesia with the patient or authorized representative who has indicated his/her understanding and acceptance.   Dental advisory given  Plan Discussed with: CRNA  Anesthesia Plan Comments:         Anesthesia Quick Evaluation

## 2017-10-11 NOTE — Brief Op Note (Signed)
10/11/2017  2:03 PM  PATIENT:  Brett [name]  77 y.o. male  PRE-OPERATIVE DIAGNOSIS:  LEFT RENAL CALCULUS  POST-OPERATIVE DIAGNOSIS:  LEFT RENAL CALCULUS  PROCEDURE:  Procedure(s): NEPHROLITHOTOMY PERCUTANEOUS (Left) HOLMIUM LASER APPLICATION (Left)  SURGEON:  Surgeon(s) and Role:    * Ace Bergfeld, Candee Furbish, MD - Primary  PHYSICIAN ASSISTANT:   ASSISTANTS: none   ANESTHESIA:   general  EBL:  100 mL   BLOOD ADMINISTERED:none  DRAINS: Urinary Catheter (Foley) and left 6x26 JJ ureteral stent, 20 french nephrostomy tube   LOCAL MEDICATIONS USED:  NONE  SPECIMEN:  Source of Specimen:  left renal calculus  DISPOSITION OF SPECIMEN:  N/A  COUNTS:  YES  TOURNIQUET:  * No tourniquets in log *  DICTATION: .Note written in EPIC  PLAN OF CARE: Admit for overnight observation  PATIENT DISPOSITION:  PACU - hemodynamically stable.   Delay start of Pharmacological VTE agent (>24hrs) due to surgical blood loss or risk of bleeding: not applicable

## 2017-10-11 NOTE — Interval H&P Note (Signed)
History and Physical Interval Note:  10/11/2017 11:38 AM  Brett [name]  has presented today for surgery, with the diagnosis of LEFT RENAL CALCULUS  The various methods of treatment have been discussed with the patient and family. After consideration of risks, benefits and other options for treatment, the patient has consented to  Procedure(s): NEPHROLITHOTOMY PERCUTANEOUS (Left) HOLMIUM LASER APPLICATION (Left) as a surgical intervention .  The patient's history has been reviewed, patient examined, no change in status, stable for surgery.  I have reviewed the patient's chart and labs.  Questions were answered to the patient's satisfaction.     Nicolette Bang

## 2017-10-11 NOTE — Anesthesia Postprocedure Evaluation (Signed)
Anesthesia Post Note  Patient: Brett [name]  Procedure(s) Performed: NEPHROLITHOTOMY PERCUTANEOUS (Left ) HOLMIUM LASER APPLICATION (Left )     Patient location during evaluation: PACU Anesthesia Type: General Level of consciousness: awake and alert Pain management: pain level controlled Vital Signs Assessment: post-procedure vital signs reviewed and stable Respiratory status: spontaneous breathing, nonlabored ventilation, respiratory function stable and patient connected to nasal cannula oxygen Cardiovascular status: blood pressure returned to baseline and stable Postop Assessment: no apparent nausea or vomiting Anesthetic complications: no    Last Vitals:  Vitals:   10/11/17 1500 10/11/17 1525  BP: (!) 142/59 139/62  Pulse: 67   Resp: 16 16  Temp:  (!) 36.4 C  SpO2: 99% 96%    Last Pain:  Vitals:   10/11/17 1423  TempSrc: Oral  PainSc:                  Klover Priestly,W. EDMOND

## 2017-10-11 NOTE — Consult Note (Signed)
Chief Complaint: Patient was seen in consultation today for left percutaneous nephrostomy/nephroureteral catheter placement  Referring Physician(s): McKenzie, Saralyn Pilar  Supervising Physician: Arne Cleveland  Patient Status: [facility] - Out-pt  TBA  History of Present Illness: Brett [name] is a 77 y.o. male with history of hematuria/left flank pain/nephrolithiasis as well as bladder stone.  He is status post cystoscopy with cystolitholapaxy secondary to bladder stone on 09/25/17.  He has a known 2 cm left upper pole renal calculus and presents today for left percutaneous nephrostomy/nephroureteral catheter placement prior to nephrolithotomy.  Past Medical History:  Diagnosis Date  . Carotid stenosis   . Elevated PSA   . 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 Glomerulonephritis 1961  . Hyperglycemia   . Hyperlipidemia   . Hyperplastic colon polyp 11/24/2009   x7  . Hypertension   . Iron deficiency anemia due to chronic blood loss 08/15/2016  . Leukocytosis 08/02/2016  . 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  . PVD (peripheral vascular disease) ([place])   . 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 BYPASS GRAFT  2006  . CORONARY ARTERY BYPASS GRAFT  12/07/2004   CABG x5 Dr [name]  . CYSTOSCOPY WITH LITHOLAPAXY N/A 09/25/2017   Procedure: CYSTOSCOPY WITH LITHOLAPAXY;  Surgeon: Cleon Gustin, MD;  Location: [facility] ORS;  Service: Urology;  Laterality: N/A;  . HOLMIUM LASER APPLICATION N/A 6/83/4196   Procedure: HOLMIUM LASER APPLICATION;  Surgeon: Cleon Gustin, MD;  Location: [facility];  Service: Urology;  Laterality: N/A;  . RIGHT HEART CATH AND CORONARY/GRAFT ANGIOGRAPHY N/A  11/21/2016   Procedure: Right Heart Cath and Coronary/Graft Angiography;  Surgeon: Sherren Mocha, MD;  Location: [facility];  Service: Cardiovascular;  Laterality: N/A;  . TEE WITHOUT CARDIOVERSION N/A 12/05/2016   Procedure: TRANSESOPHAGEAL ECHOCARDIOGRAM (TEE);  Surgeon: Sherren Mocha, MD;  Location: [facility];  Service: Open Heart Surgery;  Laterality: N/A;  . TRANSCATHETER AORTIC VALVE REPLACEMENT, TRANSFEMORAL N/A 12/05/2016   Procedure: TRANSCATHETER AORTIC VALVE REPLACEMENT, TRANSFEMORAL;  Surgeon: Sherren Mocha, MD;  Location: [facility];  Service: Open Heart Surgery;  Laterality: N/A;    Allergies: Patient has no known allergies.  Medications: Prior to Admission medications   Medication Sig Start Date End Date Taking? Authorizing Provider  acetaminophen (TYLENOL) 500 MG tablet Take 1,000 mg by mouth every 6 (six) hours as needed for moderate pain or headache.    Yes [provider]  alfuzosin (UROXATRAL) 10 MG 24 hr tablet Take 10 mg by mouth daily with breakfast.   Yes [provider]  amLODipine (NORVASC) 5 MG tablet Take 1 tablet (5 mg total) by mouth daily. 12/27/16  Yes Sherren Mocha, MD  aspirin EC 81 MG EC tablet Take 1 tablet (81 mg total) by mouth daily. 12/07/16  Yes Gold, Wayne E, PA-C  atorvastatin (LIPITOR) 40 MG tablet TAKE 1 TABLET DAILY Patient taking differently: Take 40 mg by mouth daily 08/03/17  Yes Sherren Mocha, MD  Calcium Carbonate-Vitamin D (CALTRATE 600+D) 600-400 MG-UNIT per tablet Take 1 tablet by mouth daily.     Yes [provider]  finasteride (PROSCAR) 5 MG tablet Take 5 mg by mouth daily. 09/04/16  Yes [provider]  Inositol Niacinate (NIACIN FLUSH FREE) 500 MG CAPS Take 500 mg by mouth at bedtime.   Yes [provider]  metoprolol tartrate (LOPRESSOR) 25 MG tablet Take 1 tablet (25 mg total) by mouth 2 (two) times daily. 12/27/16  Yes Sherren Mocha, MD  Multiple Vitamin (MULTIVITAMIN WITH MINERALS) TABS  tablet Take 1 tablet by mouth daily.   Yes [provider]  niacin (NIASPAN) 1000 MG CR tablet TAKE 1 TABLET AT BEDTIME Patient taking differently: Take 1000 mg by mouth at bedtime 01/29/17  Yes Marin Olp, MD  tolterodine (DETROL LA) 4 MG 24 hr capsule Take 4 mg by mouth daily. 08/31/17  Yes [provider]  Ascorbic Acid (VITAMIN C) 500 MG tablet Take 500 mg by mouth daily.      [provider]  HYDROcodone-acetaminophen (NORCO) 5-325 MG tablet Take 1 tablet by mouth every 6 (six) hours as needed for moderate pain. 09/25/17   McKenzie, Candee Furbish, MD     Family History  Problem Relation Age of Onset  . Heart attack Father   . Dementia Mother   . Colon cancer Neg Hx   . Colon polyps Neg Hx   . Rectal cancer Neg Hx   . Stomach cancer Neg Hx     Social History   Socioeconomic History  . Marital status: Married    Spouse name: None  . Number of children: 3  . Years of education: None  . Highest education level: None  Social Needs  . Financial resource strain: None  . Food insecurity - worry: None  . Food insecurity - inability: None  . Transportation needs - medical: None  . Transportation needs - non-medical: None  Occupational History  . Occupation: Retired  Tobacco Use  . Smoking status: Former Smoker    Packs/day: 1.00    Years: 40.00    Pack years: 40.00    Types: Cigarettes    Last attempt to quit: 01/01/2016    Years since quitting: 1.7  . Smokeless tobacco: Never Used  Substance and Sexual Activity  . Alcohol use: No    Alcohol/week: 0.0 oz    Frequency: Never  . Drug use: No  . Sexual activity: None  Other Topics Concern  . None  Social History Narrative   Married 1988 2nd marriage. 1 child from 2nd marriage (1994). 2 children from first marriage. 3-4 grandkids-broken relationship with his son though.       Retired from [employer] after 36 years in 1999. 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       Hobbies:  gardening, formerly bowling and pool, family time     Review of Systems denies fever, headache, chest pain, dyspnea, cough, abdominal pain, nausea, vomiting, blood in stool; he does have some intermittent left flank pain but currently no visible hematuria.  Vital Signs: BP (!) 156/72 (BP Location: Right Arm)   Pulse 90   Temp 97.9 F (36.6 C) (Oral)   Resp 18   Ht 5' 6.5" (1.689 m)   Wt 144 lb (65.3 kg)   SpO2 96%   BMI 22.89 kg/m   Physical Exam awake, alert.  Chest clear to auscultation bilaterally.  Heart with regular rate and rhythm;  Abdomen soft, positive bowel sounds, nontender.  No lower extremity edema.  Imaging: No results found.  Labs:  CBC: Recent Labs    12/07/16 0157 07/20/17 1058 09/20/17 1045 10/11/17 0742  WBC 10.1 7.9 8.4 8.5  HGB 11.6* 14.1 13.5 14.8  HCT 33.6* 40.9 38.2* 41.9  PLT 157 228.0 203 221    COAGS: Recent Labs    11/16/16 1213 12/01/16 1020 12/05/16 1047 10/11/17 0742  INR 1.0 1.06 1.18 1.03  APTT  --  35 39*  --     BMP: Recent Labs    12/06/16 0302 12/07/16 0157 07/20/17 1058 09/20/17 1045 10/11/17 0742  NA 138 138 140 144 142  K 3.8 3.9 4.5 4.9 3.7  CL 104 102 106 109 108  CO2 27 28 30 27 25   GLUCOSE 108* 116* 114* 109* 108*  BUN 9 14 19  22* 20  CALCIUM 9.2 9.2 9.5 9.5 9.5  CREATININE 0.89 0.97 1.16 1.21 1.10  GFRNONAA >60 >60  --  56* >60  GFRAA >60 >60  --  >60 >60    LIVER FUNCTION TESTS: Recent Labs    12/01/16 1020 07/20/17 1058  BILITOT 0.7 0.6  AST 23 21  ALT 17 14  ALKPHOS 57 60  PROT 6.9 6.5  ALBUMIN 4.3 4.1    TUMOR MARKERS: No results for input(s): AFPTM, CEA, CA199, CHROMGRNA in the last 8760 hours.  Assessment and Plan: 77 y.o. male with history of hematuria/left flank pain/nephrolithiasis as well as bladder stone.  He is status post cystoscopy with cystolitholapaxy secondary to bladder stone on 09/25/17.  He has a known 2 cm left upper pole renal calculus and presents today for left  percutaneous nephrostomy/nephroureteral catheter placement prior to nephrolithotomy.Risks and benefits of procedure were discussed with the patient/spouse including, but not limited to, infection, bleeding, significant bleeding causing loss or decrease in renal function or damage to adjacent structures.   All of the patient's questions were answered, patient is agreeable to proceed.  Consent signed and in chart.      Thank you for this interesting consult.  I greatly enjoyed meeting Brett [name] and look forward to participating in their care.  A copy of this report was sent to the requesting provider on this date.  Electronically Signed: D. Rowe Robert, PA-C 10/11/2017, 8:43 AM   I spent a total of 25 minutes    in face to face in clinical consultation, greater than 50% of which was counseling/coordinating care for left percutaneous nephrostomy/nephroureteral catheter placement

## 2017-10-11 NOTE — Sedation Documentation (Signed)
MD suturing L sided neph tubes x2 in at this time

## 2017-10-12 DIAGNOSIS — N200 Calculus of kidney: Secondary | ICD-10-CM | POA: Diagnosis not present

## 2017-10-12 DIAGNOSIS — I2510 Atherosclerotic heart disease of native coronary artery without angina pectoris: Secondary | ICD-10-CM | POA: Diagnosis not present

## 2017-10-12 DIAGNOSIS — Z87891 Personal history of nicotine dependence: Secondary | ICD-10-CM | POA: Diagnosis not present

## 2017-10-12 DIAGNOSIS — Z951 Presence of aortocoronary bypass graft: Secondary | ICD-10-CM | POA: Diagnosis not present

## 2017-10-12 DIAGNOSIS — I10 Essential (primary) hypertension: Secondary | ICD-10-CM | POA: Diagnosis not present

## 2017-10-12 DIAGNOSIS — I739 Peripheral vascular disease, unspecified: Secondary | ICD-10-CM | POA: Diagnosis not present

## 2017-10-12 LAB — BASIC METABOLIC PANEL
Anion gap: 7 (ref 5–15)
BUN: 21 mg/dL — ABNORMAL HIGH (ref 6–20)
CO2: 26 mmol/L (ref 22–32)
Calcium: 8.9 mg/dL (ref 8.9–10.3)
Chloride: 108 mmol/L (ref 101–111)
Creatinine, Ser: 1.15 mg/dL (ref 0.61–1.24)
GFR calc Af Amer: >60 mL/min (ref >60)
GFR calc non Af Amer: 60 mL/min — ABNORMAL LOW (ref >60)
Glucose, Bld: 148 mg/dL — ABNORMAL HIGH (ref 65–99)
Potassium: 4.4 mmol/L (ref 3.5–5.1)
Sodium: 141 mmol/L (ref 135–145)

## 2017-10-12 LAB — CBC
HCT: 33.9 % — ABNORMAL LOW (ref 39.0–52.0)
Hemoglobin: 11.6 g/dL — ABNORMAL LOW (ref 13.0–17.0)
MCH: 33.3 pg (ref 26.0–34.0)
MCHC: 34.2 g/dL (ref 30.0–36.0)
MCV: 97.4 fL (ref 78.0–100.0)
Platelets: 197 10*3/uL (ref 150–400)
RBC: 3.48 MIL/uL — ABNORMAL LOW (ref 4.22–5.81)
RDW: 13.9 % (ref 11.5–15.5)
WBC: 17.5 10*3/uL — ABNORMAL HIGH (ref 4.0–10.5)

## 2017-10-12 MED ORDER — OXYCODONE-ACETAMINOPHEN 5-325 MG PO TABS: 1.0000 | ORAL_TABLET | ORAL | 0 refills | Status: DC | PRN

## 2017-10-12 NOTE — Op Note (Signed)
Preoperative diagnosis: Left renal stone  Postoperative diagnosis: Same  Procedure 1.  Left percutaneous nephrostolithotomy for stone greater than 2 cm 2.  Left nephrostogram 3.  Intraoperative fluoroscopy, under 1 hour, with interpretation 4.  Placement of a 6 x 26 double-J ureteral stent. 5.  Left ureteroscopy with lithotripsy and stone extraction 6.  Placement of a 20 French nephrostomy tube 7.  Dilation of percutaneous tract  Attending: Dr. Alyson Ingles  Anesthesia: General  Estimated blood loss: Minimal  Antibiotics: ancef  Drains: 1.  16 French Foley catheter 2.  6 x 26 left double-J ureteral stent 3.  20 French nephrostomy tube  Specimens: Stone for analysis  Findings: 3cm left upper pole calculus in a stenotic upper pole calyx. Lower pole access  Indications: Patient is a 77 year old male with a history of large left renal stones.  After discussing treatment options and decided she was left percutaneous nephrostolithotomy.  Patient already has a nephrostomy tube.  Procedure in detail: Prior to procedure consent was obtained.  Patient was brought to the operating room debridement was done to ensure correct patient, correct procedure, and correct site.  General anesthesia was administered.  A 16 French Foley catheter was in place.  The patient was then placed in the prone position.  His nephrostomy tube and left flank was then prepped and draped in usual sterile fashion.  A nephrostogram was obtained and findings noted above.  Through the nephrostomy tube we then placed a sensor wire.  Sensor wire was coiled in the renal pelvis we then removed the nephrostomy tube.  We then made an incision at the level of the skin and over the wire we then placed a NephroMax dilator.  We dilated the nephrostomy tract to 30 [place] and held this 18 cm of water for 1 minute.  We then  placed the access sheath over the balloon.  The balloon was then deflated.  We then used a rigid nephroscope to perform  nephroscopy.  We encountered a large upper pole stone.  We then used a 365 nm laser fiber to fragment the stone in multiple pieces.  Using an in Nitinol Basket removed stone fragments and sent for composition analysis.  Once the majority of the stone was removed we then were able to perform ureteroscopy with a flexible ureteroscope.  We advanced ureteroscope down to the level of bladder.  We then placed a second wire through the ureteroscope into the bladder.  We then removed the ureteroscope and over the wire placed a 6 x 26 double-J ureteral stent.  The wire was then removed and good coil was noted in the renal pelvis under direct vision in the bladder under fluoroscopy.  We then placed a 18 French nephrostomy tube through the sheath into the renal pelvis.  The balloon was inflated with 3 amounts of contrast.  We then removed the access sheath in and obtain another nephrostogram.  We noted minimal extravasation of contrast.  We then secured the nephrostomy tubes with 0 silks in interrupted fashion.  Dressing was placed over the nephrostomy tube site and this then concluded the procedure was well-tolerated by the patient.  Complications: None  Condition: Stable, extubated, transferred to PACU  Plan: Patient is to be admitted overnight for observation.  His Foley catheter through the morning.  He is then to be discharged home and followup in 3 days with a CT to ensure removal of all calculi.

## 2017-10-12 NOTE — Discharge Instructions (Signed)
Percutaneous Nephrostomy  Percutaneous nephrostomy is a procedure to insert a flexible tube into your kidney so that urine can leave your body. This procedure may be done if a medical condition prevents urine from leaving your kidney in the usual way. Urine is normally carried from the kidneys to the bladder through narrow tubes called ureters. A ureter can become blocked because of conditions such as kidney stones, tumors, infection, or blood clots.  The nephrostomy tube will be inserted through your back. After the procedure, the tube will remain in place, and urine will drain from the kidney into a drainage bag outside your body. Draining the urine will relieve pressure and help prevent infection that could damage the kidney. Often, this procedure allows your health care provider to identify the cause of the blockage and plan appropriate treatment.  Tell a health care provider about:   Any allergies you have.   All medicines you are taking, including vitamins, herbs, eye drops, creams, and over-the-counter medicines.   Any problems you or family members have had with anesthetic medicines.   Any blood disorders you have.   Any surgeries you have had.   Any medical conditions you have.   Whether you are pregnant or may be pregnant.  What are the risks?  Generally, this is a safe procedure. However, problems may occur, including:   Infection.   Bleeding.   Allergic reactions to medicines or dyes used in the procedure.   Damage to other structures or organs.    What happens before the procedure?   Follow instructions from your health care provider about eating or drinking restrictions.   Ask your health care provider about:  ? Changing or stopping your regular medicines. This is especially important if you are taking diabetes medicines or blood thinners.  ? Taking medicines such as aspirin and ibuprofen. These medicines can thin your blood. Do not take these medicines before your procedure if your health  care provider instructs you not to.   You may be given antibiotic medicine to help prevent infection.   You may have blood tests to see how well your kidneys and liver are working and to see how well your blood can clot.   Plan to have someone take you home from the hospital or clinic.  What happens during the procedure?   To reduce your risk of infection:  ? Your health care team will wash or sanitize their hands.  ? Your skin will be washed with soap.   An IV tube will be inserted into one of your veins. You may be given medicines through this IV tube to help prevent nausea and pain.   You will be positioned on your abdomen.   You will be given one or more of the following:  ? A medicine to help you relax (sedative).  ? A medicine to numb the area (local anesthetic) where the nephrostomy tube will be inserted.   The nephrostomy tube, which is thin and flexible, will be inserted into a needle.   The needle will be inserted into your body and guided to your kidney. An imaging method that uses X-ray images (fluoroscopy) will be used to help guide the needle to the kidney.   A dye will be injected through the nephrostomy tube. Then, X-ray images that highlight your kidney will be taken.   Next, the needle will be removed, but the nephrostomy tube will be left in your kidney. The tube may be secured to your skin   with stitches (sutures).   A drainage bag will be attached to the nephrostomy tube. Urine will be able to drain from your kidney to this drainage bag outside your body.  The procedure may vary among health care providers and hospitals.  What happens after the procedure?   Your blood pressure, heart rate, breathing rate, and blood oxygen level will be monitored until the medicines you were given have worn off.   You will need to remain lying down for several hours.   You will be taught how to care for the nephrostomy tube and the drainage bag.   Donot drive for 24 hours if you were given a  sedative.  This information is not intended to replace advice given to you by your health care provider. Make sure you discuss any questions you have with your health care provider.  Document Released: 05/14/2013 Document Revised: 05/05/2016 Document Reviewed: 05/05/2016  [employer]vier Interactive Patient Education  2018 [employer]

## 2017-10-15 DIAGNOSIS — N200 Calculus of kidney: Secondary | ICD-10-CM | POA: Diagnosis not present

## 2017-10-16 ENCOUNTER — Encounter: Payer: Self-pay | Admitting: Family Medicine

## 2017-10-17 ENCOUNTER — Other Ambulatory Visit: Payer: Self-pay | Admitting: Urology

## 2017-10-18 ENCOUNTER — Encounter (HOSPITAL_COMMUNITY): Payer: Self-pay

## 2017-10-18 ENCOUNTER — Other Ambulatory Visit: Payer: Self-pay

## 2017-10-18 ENCOUNTER — Emergency Department (HOSPITAL_COMMUNITY)
Admission: EM | Admit: 2017-10-18 | Discharge: 2017-10-18 | Disposition: A | Discharge status: Home / Self Care | Payer: Medicare Other | Attending: Emergency Medicine | Admitting: Emergency Medicine

## 2017-10-18 DIAGNOSIS — N99528 Other complication of other external stoma of urinary tract: Secondary | ICD-10-CM | POA: Insufficient documentation

## 2017-10-18 DIAGNOSIS — Z79899 Other long term (current) drug therapy: Secondary | ICD-10-CM | POA: Insufficient documentation

## 2017-10-18 DIAGNOSIS — R42 Dizziness and giddiness: Secondary | ICD-10-CM | POA: Diagnosis not present

## 2017-10-18 DIAGNOSIS — Z7982 Long term (current) use of aspirin: Secondary | ICD-10-CM | POA: Diagnosis not present

## 2017-10-18 DIAGNOSIS — R1032 Left lower quadrant pain: Secondary | ICD-10-CM | POA: Diagnosis not present

## 2017-10-18 DIAGNOSIS — I739 Peripheral vascular disease, unspecified: Secondary | ICD-10-CM | POA: Diagnosis not present

## 2017-10-18 DIAGNOSIS — I10 Essential (primary) hypertension: Secondary | ICD-10-CM | POA: Insufficient documentation

## 2017-10-18 DIAGNOSIS — N200 Calculus of kidney: Secondary | ICD-10-CM | POA: Diagnosis not present

## 2017-10-18 DIAGNOSIS — R58 Hemorrhage, not elsewhere classified: Secondary | ICD-10-CM | POA: Diagnosis not present

## 2017-10-18 DIAGNOSIS — I2510 Atherosclerotic heart disease of native coronary artery without angina pectoris: Secondary | ICD-10-CM | POA: Diagnosis not present

## 2017-10-18 DIAGNOSIS — Z87891 Personal history of nicotine dependence: Secondary | ICD-10-CM | POA: Insufficient documentation

## 2017-10-18 DIAGNOSIS — T8383XA Hemorrhage of genitourinary prosthetic devices, implants and grafts, initial encounter: Secondary | ICD-10-CM | POA: Diagnosis not present

## 2017-10-18 LAB — CBC WITH DIFFERENTIAL/PLATELET
BASOS ABS: 0 10*3/uL (ref 0.0–0.1)
BASOS PCT: 0 %
EOS PCT: 0 %
Eosinophils Absolute: 0 10*3/uL (ref 0.0–0.7)
HCT: 34 % — ABNORMAL LOW (ref 39.0–52.0)
Hemoglobin: 11.6 g/dL — ABNORMAL LOW (ref 13.0–17.0)
LYMPHS PCT: 6 %
Lymphs Abs: 0.9 10*3/uL (ref 0.7–4.0)
MCH: 33.4 pg (ref 26.0–34.0)
MCHC: 34.1 g/dL (ref 30.0–36.0)
MCV: 98 fL (ref 78.0–100.0)
MONO ABS: 0.9 10*3/uL (ref 0.1–1.0)
MONOS PCT: 6 %
Neutro Abs: 13.5 10*3/uL — ABNORMAL HIGH (ref 1.7–7.7)
Neutrophils Relative %: 88 %
PLATELETS: 251 10*3/uL (ref 150–400)
RBC: 3.47 MIL/uL — ABNORMAL LOW (ref 4.22–5.81)
RDW: 13.7 % (ref 11.5–15.5)
WBC: 15.3 10*3/uL — ABNORMAL HIGH (ref 4.0–10.5)

## 2017-10-18 LAB — SAMPLE TO BLOOD BANK

## 2017-10-18 LAB — I-STAT TROPONIN, ED: TROPONIN I, POC: 0 ng/mL (ref 0.00–0.08)

## 2017-10-18 MED ORDER — OXYCODONE-ACETAMINOPHEN 5-325 MG PO TABS: 1.0000 | ORAL_TABLET | Freq: Three times a day (TID) | ORAL | 0 refills | Status: DC | PRN

## 2017-10-18 MED ORDER — SODIUM CHLORIDE 0.9 % IV BOLUS (SEPSIS)
500.0000 mL | Freq: Once | INTRAVENOUS | Status: AC
  Administered 2017-10-18: 500 mL via INTRAVENOUS

## 2017-10-18 NOTE — ED Triage Notes (Addendum)
Patient had a nephrostomy tube. Nephrostomy tube broke this AM and patient had profuse bleeding while at home.The physicianremoved the rest of the Nephrostomy tube at [facility] today. Patient had bleeding following the removal of the nephrostomy tube. Patient was hypotensive at [facility]. patient was sent to the ED for further evaluation, IV fluids, and lab tests

## 2017-10-18 NOTE — Discharge Instructions (Signed)
Watch for worsening lightheadedness dizziness or bleeding.  Follow-up with Dr. Alyson Ingles as needed.

## 2017-10-18 NOTE — ED Provider Notes (Signed)
[place] DEPT Provider Note   CSN: [id number] Arrival date & time: 10/18/17  1203     History   Chief Complaint Chief Complaint  Patient presents with  . Weakness    HPI Brett [name] is a 77 y.o. male.  HPI Patient with bleeding from his nephrostomy tube.  States he woke up this morning in blood.  Went to Dr. Noland Fordyce office from urology.  States they had to hold pressure for 20 minutes and remove the nephrostomy to stop the bleeding.  While he was there was hypotensive and reportedly pale.  Sent in for further evaluation treatment.  No further bleeding.  Patient states they have sewn up the whole.  He is not on anticoagulation.  The i-STAT Past Medical History:  Diagnosis Date  . Carotid stenosis   . Elevated PSA   . 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 Glomerulonephritis 1961  . Hyperglycemia   . Hyperlipidemia   . Hyperplastic colon polyp 11/24/2009   x7  . Hypertension   . Iron deficiency anemia due to chronic blood loss 08/15/2016  . Leukocytosis 08/02/2016  . 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  . PVD (peripheral vascular disease) ([place])   . 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 Renal calculus 10/11/2017  . Coronary artery disease involving native coronary artery of native heart without angina pectoris 12/12/2016  . 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 Iron deficiency anemia due to chronic blood loss 08/15/2016  . 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  . 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 Elevated PSA 03/17/2016  . Pulmonary nodule 06/12/2014  .  TOBACCO ABUSE 07/26/2009  . Carotid stenosis, asymptomatic, unspecified laterality 07/26/2009  . Peripheral vascular disease ([place]) 07/26/2009  . Hyperlipidemia 11/22/2007  . Essential hypertension 02/05/2007  . 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 BYPASS GRAFT  2006  . CORONARY ARTERY BYPASS GRAFT  12/07/2004   CABG x5 Dr [name]  . CYSTOSCOPY WITH LITHOLAPAXY N/A 09/25/2017   Procedure: CYSTOSCOPY WITH LITHOLAPAXY;  Surgeon: Cleon Gustin, MD;  Location: [facility];  Service: Urology;  Laterality: N/A;  . HOLMIUM LASER APPLICATION N/A 03/17/9146   Procedure: HOLMIUM LASER APPLICATION;  Surgeon: Cleon Gustin, MD;  Location: [facility];  Service: Urology;  Laterality: N/A;  . HOLMIUM LASER APPLICATION Left 03/08/9561   Procedure: HOLMIUM LASER APPLICATION;  Surgeon: Cleon Gustin, MD;  Location: [facility];  Service: Urology;  Laterality: Left;  . IR URETERAL STENT LEFT NEW ACCESS W/O SEP NEPHROSTOMY CATH  10/11/2017  . NEPHROLITHOTOMY Left 10/11/2017   Procedure: NEPHROLITHOTOMY PERCUTANEOUS;  Surgeon: Cleon Gustin, MD;  Location: [facility];  Service: Urology;  Laterality: Left;  . RIGHT HEART CATH AND CORONARY/GRAFT ANGIOGRAPHY N/A 11/21/2016   Procedure: Right Heart Cath and Coronary/Graft Angiography;  Surgeon: Sherren Mocha, MD;  Location: [facility];  Service: Cardiovascular;  Laterality: N/A;  . TEE WITHOUT CARDIOVERSION N/A 12/05/2016   Procedure: TRANSESOPHAGEAL ECHOCARDIOGRAM (TEE);  Surgeon: Sherren Mocha, MD;  Location: [facility];  Service: Open Heart Surgery;  Laterality: N/A;  .  TRANSCATHETER AORTIC VALVE REPLACEMENT, TRANSFEMORAL N/A 12/05/2016   Procedure: TRANSCATHETER AORTIC VALVE REPLACEMENT, TRANSFEMORAL;  Surgeon: Sherren Mocha, MD;  Location: [facility];  Service: Open Heart Surgery;  Laterality: N/A;       Home Medications    Prior to Admission medications   Medication Sig Start Date End Date Taking? Authorizing  Provider  acetaminophen (TYLENOL) 500 MG tablet Take 1,000 mg by mouth every 6 (six) hours as needed for moderate pain or headache.    Yes [provider]  alfuzosin (UROXATRAL) 10 MG 24 hr tablet Take 10 mg by mouth daily with breakfast.   Yes [provider]  amLODipine (NORVASC) 5 MG tablet Take 1 tablet (5 mg total) by mouth daily. 12/27/16  Yes Sherren Mocha, MD  aspirin EC 81 MG EC tablet Take 1 tablet (81 mg total) by mouth daily. 12/07/16  Yes Gold, Wayne E, PA-C  atorvastatin (LIPITOR) 40 MG tablet TAKE 1 TABLET DAILY Patient taking differently: Take 40 mg by mouth daily 08/03/17  Yes Sherren Mocha, MD  finasteride (PROSCAR) 5 MG tablet Take 5 mg by mouth daily. 09/04/16  Yes [provider]  Inositol Niacinate (NIACIN FLUSH FREE) 500 MG CAPS Take 500 mg by mouth at bedtime.   Yes [provider]  metoprolol tartrate (LOPRESSOR) 25 MG tablet Take 1 tablet (25 mg total) by mouth 2 (two) times daily. 12/27/16  Yes Sherren Mocha, MD  niacin (NIASPAN) 1000 MG CR tablet TAKE 1 TABLET AT BEDTIME Patient taking differently: Take 1000 mg by mouth at bedtime 01/29/17  Yes Marin Olp, MD  tolterodine (DETROL LA) 4 MG 24 hr capsule Take 4 mg by mouth daily. 08/31/17  Yes [provider]  HYDROcodone-acetaminophen (NORCO) 5-325 MG tablet Take 1 tablet by mouth every 6 (six) hours as needed for moderate pain. Patient not taking: Reported on 10/18/2017 09/25/17   Cleon Gustin, MD  oxyCODONE-acetaminophen (PERCOCET/ROXICET) 5-325 MG tablet Take 1-2 tablets by mouth every 8 (eight) hours as needed for moderate pain. 10/18/17   Davonna Belling, MD    Family History Family History  Problem Relation Age of Onset  . Heart attack Father   . Dementia Mother   . Colon cancer Neg Hx   . Colon polyps Neg Hx   . Rectal cancer Neg Hx   . Stomach cancer Neg Hx     Social History Social History   Tobacco Use  . Smoking status: Former Smoker     Packs/day: 1.00    Years: 40.00    Pack years: 40.00    Types: Cigarettes    Last attempt to quit: 01/01/2016    Years since quitting: 1.7  . Smokeless tobacco: Never Used  Substance Use Topics  . Alcohol use: No    Alcohol/week: 0.0 oz    Frequency: Never  . Drug use: No     Allergies   Levaquin [levofloxacin in d5w]   Review of Systems Review of Systems  Constitutional: Negative for appetite change.  HENT: Negative for congestion.   Respiratory: Negative for shortness of breath.   Cardiovascular: Negative for chest pain.  Gastrointestinal: Negative for abdominal pain.  Genitourinary: Positive for flank pain.  Musculoskeletal: Negative for back pain.  Neurological: Positive for light-headedness.  Hematological: Negative for adenopathy.  Psychiatric/Behavioral: Negative for confusion.     Physical Exam Updated Vital Signs BP (!) 122/52 (BP Location: Left Arm)   Pulse 88   Temp 97.8 F (36.6 C) (Oral)   Resp 18  Ht 5\' 6"  (1.676 m)   Wt 67.1 kg (148 lb)   SpO2 98%   BMI 23.89 kg/m   Physical Exam  Constitutional: He appears well-developed.  HENT:  Head: Atraumatic.  Eyes: Pupils are equal, round, and reactive to light.  Neck: Neck supple.  Cardiovascular: Normal rate.  Abdominal: There is no tenderness.  Musculoskeletal: He exhibits no tenderness.  Left posterior nephrostomy site sutured closed with some dried blood about it.  No active bleeding.  Neurological: He is alert.  Skin: Skin is warm. Capillary refill takes less than 2 seconds.  Psychiatric: He has a normal mood and affect.     ED Treatments / Results  Labs (all labs ordered are listed, but only abnormal results are displayed) Labs Reviewed  CBC WITH DIFFERENTIAL/PLATELET - Abnormal; Notable for the following components:      Result Value   WBC 15.3 (*)    RBC 3.47 (*)    Hemoglobin 11.6 (*)    HCT 34.0 (*)    Neutro Abs 13.5 (*)    All other components within normal limits  I-STAT  CHEM 8, ED  I-STAT TROPONIN, ED  SAMPLE TO BLOOD BANK    EKG  EKG Interpretation None       Radiology No results found.  Procedures Procedures (including critical care time)  Medications Ordered in ED Medications  sodium chloride 0.9 % bolus 500 mL (0 mLs Intravenous Stopped 10/18/17 1552)     Initial Impression / Assessment and Plan / ED Course  I have reviewed the triage vital signs and the nursing notes.  Pertinent labs & imaging results that were available during my care of the patient were reviewed by me and considered in my medical decision making (see chart for details).     Patient with bleeding from nephrostomy tube.  Feels much better after IV fluids.  Heart rate normal.  Blood pressure improved.  Slightly orthostatic but is been tolerating orals.  Able to ambulate home.  Discussed with Dr. Alyson Ingles.  Will give the pain medicine that he was going to prescribe in the clinic.  Given 30 pills.  Will return to follow with Dr. Alyson Ingles or the ER sooner if he feels more lightheaded or has more bleeding.  Hemoglobin reviewed and is similar to where it was 6 days ago.  Final Clinical Impressions(s) / ED Diagnoses   Final diagnoses:  Blood loss  Nephrostomy complication [facility])    ED Discharge Orders        Ordered    oxyCODONE-acetaminophen (PERCOCET/ROXICET) 5-325 MG tablet  Every 8 hours PRN     10/18/17 1556       Davonna Belling, MD 10/18/17 1653

## 2017-10-18 NOTE — ED Notes (Signed)
Bed: WA17 Expected date:  Expected time:  Means of arrival:  Comments: RES A 

## 2017-10-19 NOTE — Patient Instructions (Addendum)
Brett [name]  10/19/2017   Your procedure is scheduled on: 11-02-17   Report to [facility] Main  Entrance Report to admitting at 11:00 AM   Call this number if you have problems the morning of surgery [phone]   Remember: Do not eat food or drink liquids :After Midnight. You may have a Clear Liquid Diet from Midnight until 7:00 AM. After 7:00 AM, nothing until after surgery.      CLEAR LIQUID DIET   Foods Allowed                                                                     Foods Excluded  Coffee and tea, regular and decaf                             liquids that you cannot  Plain Jell-O in any flavor                                             see through such as: Fruit ices (not with fruit pulp)                                     milk, soups, orange juice  Iced Popsicles                                    All solid food Carbonated beverages, regular and diet                                    Cranberry, grape and apple juices Sports drinks like Gatorade Lightly seasoned clear broth or consume(fat free) Sugar, honey syrup  Sample Menu Breakfast                                Lunch                                     Supper Cranberry juice                    Beef broth                            Chicken broth Jell-O                                     Grape juice                           Apple  juice Coffee or tea                        Jell-O                                      Popsicle                                                Coffee or tea                        Coffee or tea  _____________________________________________________________________    Take these medicines the morning of surgery with A SIP OF WATER: Amlodipine (Norvasc) and Metoprolol Tartrate (Lopressor),                                You may not have any metal on your body including hair pins and              piercings  Do not wear jewelry, lotions, powders or  deodorant             Men may shave face and neck.   Do not bring valuables to the hospital. Protection.  Contacts, dentures or bridgework may not be worn into surgery.  Leave suitcase in the car. After surgery it may be brought to your room.                  Please read over the following fact sheets you were given: _____________________________________________________________________           [facility] - Preparing for Surgery Before surgery, you can play an important role.  Because skin is not sterile, your skin needs to be as free of germs as possible.  You can reduce the number of germs on your skin by washing with CHG (chlorahexidine gluconate) soap before surgery.  CHG is an antiseptic cleaner which kills germs and bonds with the skin to continue killing germs even after washing. Please DO NOT use if you have an allergy to CHG or antibacterial soaps.  If your skin becomes reddened/irritated stop using the CHG and inform your nurse when you arrive at Short Stay. Do not shave (including legs and underarms) for at least 48 hours prior to the first CHG shower.  You may shave your face/neck. Please follow these instructions carefully:  1.  Shower with CHG Soap the night before surgery and the  morning of Surgery.  2.  If you choose to wash your hair, wash your hair first as usual with your  normal  shampoo.  3.  After you shampoo, rinse your hair and body thoroughly to remove the  shampoo.                           4.  Use CHG as you would any other liquid soap.  You can apply chg directly  to the skin and wash  Gently with a scrungie or clean washcloth.  5.  Apply the CHG Soap to your body ONLY FROM THE NECK DOWN.   Do not use on face/ open                           Wound or open sores. Avoid contact with eyes, ears mouth and genitals (private parts).                       Wash face,  Genitals (private parts) with your  normal soap.             6.  Wash thoroughly, paying special attention to the area where your surgery  will be performed.  7.  Thoroughly rinse your body with warm water from the neck down.  8.  DO NOT shower/wash with your normal soap after using and rinsing off  the CHG Soap.                9.  Pat yourself dry with a clean towel.            10.  Wear clean pajamas.            11.  Place clean sheets on your bed the night of your first shower and do not  sleep with pets. Day of Surgery : Do not apply any lotions/deodorants the morning of surgery.  Please wear clean clothes to the hospital/surgery center.  FAILURE TO FOLLOW THESE INSTRUCTIONS MAY RESULT IN THE CANCELLATION OF YOUR SURGERY PATIENT SIGNATURE_________________________________  NURSE SIGNATURE__________________________________  ________________________________________________________________________

## 2017-10-19 NOTE — Progress Notes (Signed)
08-03-17 (Epic) EKG and LOV with Dr. Burt Knack  12-27-16 (Epic) ECHO

## 2017-10-22 ENCOUNTER — Encounter: Payer: Self-pay | Admitting: Family Medicine

## 2017-10-22 NOTE — Discharge Summary (Signed)
Physician Discharge Summary  Patient ID: OMARE BILOTTA MRN: [id number] DOB/AGE: May 06, 1941 77 y.o.  Admit date: 10/11/2017 Discharge date: 10/12/2017  Admission Diagnoses: Renal calculus  Discharge Diagnoses:  Active Problems:   Renal calculus   Discharged Condition: good  Hospital Course: The patient tolerated the procedure well and was transferred to the floor on IV pain meds, IV fluid. On POD#1 foley was removed, pt was started on a regular diet and they ambulated in the halls. Prior to discharge the pt was tolerating a regular diet, pain was controlled on PO pain meds, they were ambulating without difficulty, and they had normal bowel function.   Consults: None  Significant Diagnostic Studies: none  Treatments: surgery: PCNL  Discharge Exam: Blood pressure 137/64, pulse 87, temperature 98.8 F (37.1 C), temperature source Oral, resp. rate 18, height 5' 6.5" (1.689 m), weight 65.3 kg (144 lb), SpO2 97 %. General appearance: alert, cooperative and appears stated age Head: Normocephalic, without obvious abnormality, atraumatic Nose: Nares normal. Septum midline. Mucosa normal. No drainage or sinus tenderness. Neck: no adenopathy, no carotid bruit, no JVD, supple, symmetrical, trachea midline and thyroid not enlarged, symmetric, no tenderness/mass/nodules Resp: clear to auscultation bilaterally Cardio: regular rate and rhythm, S1, S2 normal, no murmur, click, rub or gallop GI: soft, non-tender; bowel sounds normal; no masses,  no organomegaly Extremities: extremities normal, atraumatic, no cyanosis or edema Neurologic: Grossly normal  Disposition: Discharge disposition: 01-Home or Self Care       Discharge Instructions    Discharge patient   Complete by:  As directed    Discharge disposition:  01-Home or Self Care   Discharge patient date:  10/12/2017     Allergies as of 10/12/2017   No Known Allergies     Medication List    TAKE these medications    acetaminophen 500 MG tablet Commonly known as:  TYLENOL Take 1,000 mg by mouth every 6 (six) hours as needed for moderate pain or headache.   alfuzosin 10 MG 24 hr tablet Commonly known as:  UROXATRAL Take 10 mg by mouth daily with breakfast.   amLODipine 5 MG tablet Commonly known as:  NORVASC Take 1 tablet (5 mg total) by mouth daily.   aspirin 81 MG EC tablet Take 1 tablet (81 mg total) by mouth daily.   atorvastatin 40 MG tablet Commonly known as:  LIPITOR TAKE 1 TABLET DAILY What changed:    how much to take  how to take this  when to take this   finasteride 5 MG tablet Commonly known as:  PROSCAR Take 5 mg by mouth daily.   HYDROcodone-acetaminophen 5-325 MG tablet Commonly known as:  NORCO Take 1 tablet by mouth every 6 (six) hours as needed for moderate pain.   metoprolol tartrate 25 MG tablet Commonly known as:  LOPRESSOR Take 1 tablet (25 mg total) by mouth 2 (two) times daily.   niacin 1000 MG CR tablet Commonly known as:  NIASPAN TAKE 1 TABLET AT BEDTIME What changed:    how much to take  how to take this  when to take this   NIACIN FLUSH FREE 500 MG Caps Generic drug:  Inositol Niacinate Take 500 mg by mouth at bedtime.   tolterodine 4 MG 24 hr capsule Commonly known as:  DETROL LA Take 4 mg by mouth daily.      Follow-up Information    Gonzalo Waymire, Candee Furbish, MD. Call on 10/15/2017.   Specialty:  Urology Contact information: [place]  [place] [address] [phone]           Signed: Nicolette Bang 10/22/2017, 1:38 PM

## 2017-10-25 ENCOUNTER — Encounter (HOSPITAL_COMMUNITY): Payer: Self-pay

## 2017-10-25 ENCOUNTER — Encounter (HOSPITAL_COMMUNITY)
Admission: RE | Admit: 2017-10-25 | Discharge: 2017-10-25 | Disposition: A | Discharge status: Home / Self Care | Payer: Medicare Other | Source: Ambulatory Visit | Attending: Urology | Admitting: Urology

## 2017-10-25 ENCOUNTER — Other Ambulatory Visit: Payer: Self-pay

## 2017-10-25 DIAGNOSIS — Z01812 Encounter for preprocedural laboratory examination: Secondary | ICD-10-CM | POA: Diagnosis not present

## 2017-10-25 DIAGNOSIS — N200 Calculus of kidney: Secondary | ICD-10-CM | POA: Insufficient documentation

## 2017-10-25 LAB — BASIC METABOLIC PANEL
ANION GAP: 7 (ref 5–15)
BUN: 17 mg/dL (ref 6–20)
CO2: 26 mmol/L (ref 22–32)
CREATININE: 1.42 mg/dL — AB (ref 0.61–1.24)
Calcium: 9.2 mg/dL (ref 8.9–10.3)
Chloride: 106 mmol/L (ref 101–111)
GFR, EST AFRICAN AMERICAN: 54 mL/min — AB (ref >60)
GFR, EST NON AFRICAN AMERICAN: 46 mL/min — AB (ref >60)
GLUCOSE: 116 mg/dL — AB (ref 65–99)
Potassium: 4.7 mmol/L (ref 3.5–5.1)
Sodium: 139 mmol/L (ref 135–145)

## 2017-10-25 LAB — CBC
HCT: 32.5 % — ABNORMAL LOW (ref 39.0–52.0)
Hemoglobin: 11 g/dL — ABNORMAL LOW (ref 13.0–17.0)
MCH: 33 pg (ref 26.0–34.0)
MCHC: 33.8 g/dL (ref 30.0–36.0)
MCV: 97.6 fL (ref 78.0–100.0)
PLATELETS: 328 10*3/uL (ref 150–400)
RBC: 3.33 MIL/uL — ABNORMAL LOW (ref 4.22–5.81)
RDW: 13.6 % (ref 11.5–15.5)
WBC: 11.4 10*3/uL — ABNORMAL HIGH (ref 4.0–10.5)

## 2017-10-25 NOTE — Progress Notes (Signed)
10-25-17 BMP routed to Dr. Alyson Ingles for review

## 2017-10-26 ENCOUNTER — Emergency Department (HOSPITAL_COMMUNITY)
Admission: EM | Admit: 2017-10-26 | Discharge: 2017-10-27 | Disposition: A | Discharge status: Home / Self Care | Payer: Medicare Other | Attending: Emergency Medicine | Admitting: Emergency Medicine

## 2017-10-26 ENCOUNTER — Encounter (HOSPITAL_COMMUNITY): Payer: Self-pay

## 2017-10-26 ENCOUNTER — Emergency Department (HOSPITAL_COMMUNITY): Payer: Medicare Other

## 2017-10-26 ENCOUNTER — Other Ambulatory Visit: Payer: Self-pay

## 2017-10-26 DIAGNOSIS — E785 Hyperlipidemia, unspecified: Secondary | ICD-10-CM | POA: Insufficient documentation

## 2017-10-26 DIAGNOSIS — Z7982 Long term (current) use of aspirin: Secondary | ICD-10-CM | POA: Insufficient documentation

## 2017-10-26 DIAGNOSIS — K8020 Calculus of gallbladder without cholecystitis without obstruction: Secondary | ICD-10-CM | POA: Diagnosis not present

## 2017-10-26 DIAGNOSIS — R310 Gross hematuria: Secondary | ICD-10-CM

## 2017-10-26 DIAGNOSIS — I10 Essential (primary) hypertension: Secondary | ICD-10-CM | POA: Insufficient documentation

## 2017-10-26 DIAGNOSIS — R339 Retention of urine, unspecified: Secondary | ICD-10-CM

## 2017-10-26 DIAGNOSIS — Z87891 Personal history of nicotine dependence: Secondary | ICD-10-CM | POA: Diagnosis not present

## 2017-10-26 DIAGNOSIS — R3912 Poor urinary stream: Secondary | ICD-10-CM | POA: Diagnosis not present

## 2017-10-26 DIAGNOSIS — Z79899 Other long term (current) drug therapy: Secondary | ICD-10-CM | POA: Diagnosis not present

## 2017-10-26 DIAGNOSIS — Z951 Presence of aortocoronary bypass graft: Secondary | ICD-10-CM | POA: Diagnosis not present

## 2017-10-26 DIAGNOSIS — R1030 Lower abdominal pain, unspecified: Secondary | ICD-10-CM | POA: Diagnosis not present

## 2017-10-26 LAB — URINALYSIS, MICROSCOPIC (REFLEX)
Bacteria, UA: NONE SEEN
Squamous Epithelial / LPF: NONE SEEN

## 2017-10-26 LAB — CBC WITH DIFFERENTIAL/PLATELET
Basophils Absolute: 0 10*3/uL (ref 0.0–0.1)
Basophils Relative: 0 %
Eosinophils Absolute: 0 10*3/uL (ref 0.0–0.7)
Eosinophils Relative: 0 %
HEMATOCRIT: 28.3 % — AB (ref 39.0–52.0)
HEMOGLOBIN: 9.5 g/dL — AB (ref 13.0–17.0)
LYMPHS PCT: 8 %
Lymphs Abs: 1.4 10*3/uL (ref 0.7–4.0)
MCH: 32.5 pg (ref 26.0–34.0)
MCHC: 33.6 g/dL (ref 30.0–36.0)
MCV: 96.9 fL (ref 78.0–100.0)
MONOS PCT: 8 %
Monocytes Absolute: 1.2 10*3/uL — ABNORMAL HIGH (ref 0.1–1.0)
NEUTROS ABS: 13.6 10*3/uL — AB (ref 1.7–7.7)
Neutrophils Relative %: 84 %
Platelets: 339 10*3/uL (ref 150–400)
RBC: 2.92 MIL/uL — ABNORMAL LOW (ref 4.22–5.81)
RDW: 13.9 % (ref 11.5–15.5)
WBC: 16.3 10*3/uL — ABNORMAL HIGH (ref 4.0–10.5)

## 2017-10-26 LAB — URINALYSIS, ROUTINE W REFLEX MICROSCOPIC

## 2017-10-26 NOTE — ED Triage Notes (Addendum)
Pt arrives today with c/o of urinary urgency with no urination since 3pm today. Pt recently had nephrostomy tube removed. C/o left lower back pain where stent is still in place for a future study related to kidney stones.

## 2017-10-26 NOTE — ED Provider Notes (Signed)
[facility] DEPT Provider Note   CSN: [id number] Arrival date & time: 10/26/17  1844     History   Chief Complaint No chief complaint on file.   HPI Brett [name] is a 77 y.o. male with past medical history significant for nephrolithiasis and recent nephrolithiasis status post nephrolithostomy presenting with urge to urinate without voiding satrting today. Patient explains that he had a large stone removed from his bladder and attempts were made to removed a large left ureteral stone but was unsuccessful and they had to access the ureter percutaneously and left stents in place. Patient had post procedural imaging revealing residual stone burden and is scheduled to have another procedure on 11/02/2017. He was seen yesterday for pre-op visit and told that the decreased urine was normal, he has also been experiencing gross hematuria with clot and was evaluated yesterday. He was told to call if fever. Patient presents due to increased pain after urge to urinate and inability to void. He denies fever, chills, nausea, vomiting or other symptoms.  HPI  Past Medical History:  Diagnosis Date  . Carotid stenosis   . Elevated PSA   . 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 Glomerulonephritis 1961  . Hyperglycemia   . Hyperlipidemia   . Hyperplastic colon polyp 11/24/2009   x7  . Hypertension   . Iron deficiency anemia due to chronic blood loss 08/15/2016  . Leukocytosis 08/02/2016  . 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  . PVD (peripheral vascular disease) ([place])   . 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 Renal calculus 10/11/2017  . Coronary artery  disease involving native coronary artery of native heart without angina pectoris 12/12/2016  . 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 Iron deficiency anemia due to chronic blood loss 08/15/2016  . 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  . 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 Elevated PSA 03/17/2016  . Pulmonary nodule 06/12/2014  . TOBACCO ABUSE 07/26/2009  . Carotid stenosis, asymptomatic, unspecified laterality 07/26/2009  . Peripheral vascular disease ([place]) 07/26/2009  . Hyperlipidemia 11/22/2007  . Essential hypertension 02/05/2007  . 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 BYPASS GRAFT  2006  . CORONARY ARTERY BYPASS GRAFT  12/07/2004   CABG x5 Dr [name]  . CYSTOSCOPY WITH LITHOLAPAXY N/A 09/25/2017   Procedure: CYSTOSCOPY WITH LITHOLAPAXY;  Surgeon: Cleon Gustin, MD;  Location: [facility];  Service: Urology;  Laterality: N/A;  . HOLMIUM LASER APPLICATION N/A 11/13/8117   Procedure: HOLMIUM LASER APPLICATION;  Surgeon: Cleon Gustin, MD;  Location: [facility];  Service: Urology;  Laterality: N/A;  . HOLMIUM LASER APPLICATION Left 08/11/7827   Procedure: HOLMIUM LASER APPLICATION;  Surgeon: Cleon Gustin, MD;  Location: [facility];  Service: Urology;  Laterality: Left;  . IR URETERAL STENT LEFT NEW ACCESS W/O SEP NEPHROSTOMY CATH  10/11/2017  . NEPHROLITHOTOMY Left 10/11/2017   Procedure: NEPHROLITHOTOMY PERCUTANEOUS;  Surgeon: Cleon Gustin, MD;  Location: [facility];  Service: Urology;  Laterality: Left;  . RIGHT HEART CATH AND CORONARY/GRAFT ANGIOGRAPHY N/A 11/21/2016   Procedure: Right Heart  Cath and Coronary/Graft Angiography;  Surgeon: Sherren Mocha, MD;  Location: [facility];  Service: Cardiovascular;  Laterality: N/A;  . TEE WITHOUT CARDIOVERSION N/A 12/05/2016   Procedure: TRANSESOPHAGEAL ECHOCARDIOGRAM (TEE);  Surgeon: Sherren Mocha, MD;  Location: [facility];   Service: Open Heart Surgery;  Laterality: N/A;  . TRANSCATHETER AORTIC VALVE REPLACEMENT, TRANSFEMORAL N/A 12/05/2016   Procedure: TRANSCATHETER AORTIC VALVE REPLACEMENT, TRANSFEMORAL;  Surgeon: Sherren Mocha, MD;  Location: [facility];  Service: Open Heart Surgery;  Laterality: N/A;        Home Medications    Prior to Admission medications   Medication Sig Start Date End Date Taking? Authorizing Provider  acetaminophen (TYLENOL) 500 MG tablet Take 1,000 mg by mouth every 6 (six) hours as needed for moderate pain or headache.    Yes [provider]  alfuzosin (UROXATRAL) 10 MG 24 hr tablet Take 10 mg by mouth every evening.    Yes [provider]  amLODipine (NORVASC) 5 MG tablet Take 1 tablet (5 mg total) by mouth daily. 12/27/16  Yes Sherren Mocha, MD  aspirin EC 81 MG EC tablet Take 1 tablet (81 mg total) by mouth daily. 12/07/16  Yes Gold, Wayne E, PA-C  atorvastatin (LIPITOR) 40 MG tablet TAKE 1 TABLET DAILY Patient taking differently: Take 40 mg by mouth daily 08/03/17  Yes Sherren Mocha, MD  finasteride (PROSCAR) 5 MG tablet Take 5 mg by mouth daily. 09/04/16  Yes [provider]  Inositol Niacinate (NIACIN FLUSH FREE) 500 MG CAPS Take 500 mg by mouth at bedtime.   Yes [provider]  metoprolol tartrate (LOPRESSOR) 25 MG tablet Take 1 tablet (25 mg total) by mouth 2 (two) times daily. 12/27/16  Yes Sherren Mocha, MD  mirabegron ER (MYRBETRIQ) 50 MG TB24 tablet Take 50 mg by mouth every evening.   Yes [provider]  niacin (NIASPAN) 1000 MG CR tablet TAKE 1 TABLET AT BEDTIME Patient taking differently: Take 1000 mg by mouth at bedtime 01/29/17  Yes Marin Olp, MD  oxyCODONE-acetaminophen (PERCOCET/ROXICET) 5-325 MG tablet Take 1-2 tablets by mouth every 8 (eight) hours as needed for moderate pain. 10/18/17  Yes Davonna Belling, MD  tolterodine (DETROL LA) 4 MG 24 hr capsule Take 4 mg by mouth daily.  08/31/17  Yes [provider]  HYDROcodone-acetaminophen (NORCO) 5-325 MG tablet Take 1 tablet by mouth every 6 (six) hours as needed for moderate pain. Patient not taking: Reported on 10/18/2017 09/25/17   Cleon Gustin, MD    Family History Family History  Problem Relation Age of Onset  . Heart attack Father   . Dementia Mother   . Colon cancer Neg Hx   . Colon polyps Neg Hx   . Rectal cancer Neg Hx   . Stomach cancer Neg Hx     Social History Social History   Tobacco Use  . Smoking status: Former Smoker    Packs/day: 1.00    Years: 40.00    Pack years: 40.00    Types: Cigarettes    Last attempt to quit: 01/01/2016    Years since quitting: 1.8  . Smokeless tobacco: Never Used  Substance Use Topics  . Alcohol use: No    Alcohol/week: 0.0 oz    Frequency: Never  . Drug use: No     Allergies   Levaquin [levofloxacin in d5w]   Review of Systems Review of Systems  Constitutional: Negative for chills, diaphoresis, fatigue and fever.  Respiratory: Negative for cough, choking,  chest tightness, shortness of breath, wheezing and stridor.   Cardiovascular: Negative for chest pain and palpitations.  Gastrointestinal: Positive for abdominal pain. Negative for abdominal distention, blood in stool, constipation, diarrhea, nausea and vomiting.       Intermittent suprapubic pain with urge to urinate.  Resolves once the urge is gone.  Genitourinary: Positive for decreased urine volume, difficulty urinating, hematuria and urgency. Negative for discharge, dysuria, flank pain, frequency, genital sores, penile pain, penile swelling, scrotal swelling and testicular pain.  Musculoskeletal: Negative for arthralgias, back pain, myalgias and neck pain.  Skin: Negative for color change, pallor and rash.  Neurological: Negative for dizziness, seizures, syncope and light-headedness.     Physical Exam Updated Vital Signs BP (!) 118/38   Pulse 90   Temp 98.7 F (37.1 C) (Oral)   Resp 19   Ht 5\' 6"  (1.676 m)    Wt 66.2 kg (146 lb)   SpO2 95%   BMI 23.57 kg/m   Physical Exam  Constitutional: He appears well-developed and well-nourished. No distress.  Afebrile, nontoxic-appearing, lying comfortably in bed no acute distress.  HENT:  Head: Normocephalic and atraumatic.  Eyes: Conjunctivae and EOM are normal. Right eye exhibits no discharge. Left eye exhibits no discharge. No scleral icterus.  Neck: Neck supple.  Cardiovascular: Normal rate, regular rhythm and normal heart sounds.  Pulmonary/Chest: Effort normal and breath sounds normal. No stridor. No respiratory distress. He has no wheezes. He has no rales.  Abdominal: Soft. He exhibits no distension and no mass. There is tenderness. There is no rebound and no guarding.  Mild suprapubic discomfort on palpation  Musculoskeletal: Normal range of motion. He exhibits no edema.  Neurological: He is alert.  Skin: Skin is warm and dry. No rash noted. He is not diaphoretic. No erythema. No pallor.  Psychiatric: He has a normal mood and affect.  Nursing note and vitals reviewed.    ED Treatments / Results  Labs (all labs ordered are listed, but only abnormal results are displayed) Labs Reviewed  URINALYSIS, ROUTINE W REFLEX MICROSCOPIC - Abnormal; Notable for the following components:      Result Value   Color, Urine RED (*)    APPearance TURBID (*)    Glucose, UA   (*)    Value: TEST NOT REPORTED DUE TO COLOR INTERFERENCE OF URINE PIGMENT   Hgb urine dipstick   (*)    Value: TEST NOT REPORTED DUE TO COLOR INTERFERENCE OF URINE PIGMENT   Bilirubin Urine   (*)    Value: TEST NOT REPORTED DUE TO COLOR INTERFERENCE OF URINE PIGMENT   Ketones, ur   (*)    Value: TEST NOT REPORTED DUE TO COLOR INTERFERENCE OF URINE PIGMENT   Protein, ur   (*)    Value: TEST NOT REPORTED DUE TO COLOR INTERFERENCE OF URINE PIGMENT   Nitrite   (*)    Value: TEST NOT REPORTED DUE TO COLOR INTERFERENCE OF URINE PIGMENT   Leukocytes, UA   (*)    Value: TEST NOT  REPORTED DUE TO COLOR INTERFERENCE OF URINE PIGMENT   All other components within normal limits  CBC WITH DIFFERENTIAL/PLATELET - Abnormal; Notable for the following components:   WBC 16.3 (*)    RBC 2.92 (*)    Hemoglobin 9.5 (*)    HCT 28.3 (*)    Neutro Abs 13.6 (*)    Monocytes Absolute 1.2 (*)    All other components within normal limits  URINE CULTURE  URINALYSIS, MICROSCOPIC (REFLEX)  EKG None  Radiology Ct Renal Stone Study  Result Date: 10/26/2017 CLINICAL DATA:  Known stone. Post lithotomy and stent placement. Scheduled for additional procedure 11/02/2017. Now with urinary retention and suprapubic pain/hematuria. EXAM: CT ABDOMEN AND PELVIS WITHOUT CONTRAST TECHNIQUE: Multidetector CT imaging of the abdomen and pelvis was performed following the standard protocol without IV contrast. COMPARISON:  10/15/2017 and 11/27/2016 FINDINGS: Lower chest: Lung bases are within normal. Calcification of the mitral valve annulus. Hepatobiliary: Minimal cholelithiasis. Liver and biliary tree are within normal. Pancreas: Normal. Spleen: Normal. Adrenals/Urinary Tract: Adrenal glands are normal. Kidneys are normal in size. There is mild bilateral nephrolithiasis left worse than right. Interval removal of percutaneous left-sided nephrostomy catheter. Double-J left-sided internal ureteral stent is in adequate position. Minimal increased density to the left renal pelvis which may be due to recent procedure with contrast administration versus minimal hemorrhage. Minimal prominence of the left intrarenal collecting system. Mild stranding of the left perinephric fat. Several right renal cysts unchanged. Right ureter is normal. Foley catheter is present within the bladder. Mild increased density to the fluid within the bladder which may again be due to recent contrast seizure. Two small calcifications over the bladder which may be recently passed stones. Stomach/Bowel: Stomach and small bowel are normal.  Appendix is normal. Mild diverticulosis of the colon. Vascular/Lymphatic: Moderate calcified plaque over the abdominal aorta and iliac arteries. 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 No adenopathy. Reproductive: Normal. Other: None. Musculoskeletal: Moderate degenerative changes of the spine and mild degenerate change of the hips. Multi level disc disease involving the lumbar spine. IMPRESSION: Interval removal of percutaneous left nephrostomy catheter. Left-sided double-J internal ureteral stent in adequate position. Minimal prominence of the left intrarenal collecting system with multiple left renal stones visualized. Mild right-sided nephrolithiasis and a couple tiny stones within the bladder. Colonic diverticulosis. Right renal cysts unchanged. Minimal cholelithiasis. Short segment infrarenal aortic dissection unchanged. Aortic Atherosclerosis (ICD10-I70.0). Electronically Signed   By: Marin Olp M.D.   On: 10/26/2017 21:16    Procedures Procedures (including critical care time)  Medications Ordered in ED Medications - No data to display   Initial Impression / Assessment and Plan / ED Course  I have reviewed the triage vital signs and the nursing notes.  Pertinent labs & imaging results that were available during my care of the patient were reviewed by me and considered in my medical decision making (see chart for details).    Patient presents with decreased urinary output, urge to urinate with intermittent suprapubic pain.  Bladder scan with 400 mL residual. ordered cath Patient is otherwise well-appearing, nontoxic afebrile.  Abdomen is soft and non-tender. No nausea vomiting.  status post cystoscopy with cystolitholapaxy secondary to bladder stone on 09/25/17 Status post left percutaneous nephrostomy/nephroureteral catheter placement  Known residual stone and scheduled for removal on  11/02/2017.  Consulted urology and spoke to Dr. Lovena Neighbours who recommends keeping foley in until scheduled surgery on the 29th. Maintain same return precautions if fever. Requested CBC for White count. Ordered CBC  Plan for discharged with foley and close follow up with urology.  Patient is well-appearing, non-toxic, afebrile. Urine sent for culture. CBC with elevated white count. 16.3 from 11.4 yesterday, hgb 9.5  from 11.0 yesterday  Called urology to make them aware of CBC results and discuss plan for dispo given new findings. Spoke to Dr. Lovena Neighbours who is comfortable sending him home with follow up.  Dc to home.  Discussed strict return precautions and advised to return to the emergency department if experiencing any new or worsening symptoms. Instructions were understood and patient agreed with discharge plan.  Final Clinical Impressions(s) / ED Diagnoses   Final diagnoses:  Urinary retention  Gross hematuria    ED Discharge Orders    None       Dossie Der 10/26/17 [id number]    Tegeler, Gwenyth Allegra, MD 10/27/17 1056

## 2017-10-26 NOTE — Discharge Instructions (Addendum)
As discussed, keep the foley catheter in until your scheduled surgery and follow up with Dr. Alyson Ingles.  Make sure that you drink plenty of fluids.  Return if symptoms worsen and monitor for any fevers.

## 2017-10-28 ENCOUNTER — Emergency Department (HOSPITAL_COMMUNITY)
Admission: EM | Admit: 2017-10-28 | Discharge: 2017-10-28 | Disposition: A | Discharge status: Home / Self Care | Payer: Medicare Other | Attending: Emergency Medicine | Admitting: Emergency Medicine

## 2017-10-28 ENCOUNTER — Encounter (HOSPITAL_COMMUNITY): Payer: Self-pay

## 2017-10-28 DIAGNOSIS — Z951 Presence of aortocoronary bypass graft: Secondary | ICD-10-CM | POA: Insufficient documentation

## 2017-10-28 DIAGNOSIS — Z87891 Personal history of nicotine dependence: Secondary | ICD-10-CM | POA: Diagnosis not present

## 2017-10-28 DIAGNOSIS — Y829 Unspecified medical devices associated with adverse incidents: Secondary | ICD-10-CM | POA: Diagnosis not present

## 2017-10-28 DIAGNOSIS — I10 Essential (primary) hypertension: Secondary | ICD-10-CM | POA: Insufficient documentation

## 2017-10-28 DIAGNOSIS — R339 Retention of urine, unspecified: Secondary | ICD-10-CM | POA: Insufficient documentation

## 2017-10-28 DIAGNOSIS — T839XXA Unspecified complication of genitourinary prosthetic device, implant and graft, initial encounter: Secondary | ICD-10-CM | POA: Diagnosis not present

## 2017-10-28 DIAGNOSIS — Z79899 Other long term (current) drug therapy: Secondary | ICD-10-CM | POA: Diagnosis not present

## 2017-10-28 DIAGNOSIS — I2510 Atherosclerotic heart disease of native coronary artery without angina pectoris: Secondary | ICD-10-CM | POA: Insufficient documentation

## 2017-10-28 DIAGNOSIS — Z7982 Long term (current) use of aspirin: Secondary | ICD-10-CM | POA: Insufficient documentation

## 2017-10-28 DIAGNOSIS — T83098A Other mechanical complication of other indwelling urethral catheter, initial encounter: Secondary | ICD-10-CM | POA: Diagnosis not present

## 2017-10-28 DIAGNOSIS — R319 Hematuria, unspecified: Secondary | ICD-10-CM | POA: Diagnosis not present

## 2017-10-28 DIAGNOSIS — T839XXD Unspecified complication of genitourinary prosthetic device, implant and graft, subsequent encounter: Secondary | ICD-10-CM

## 2017-10-28 NOTE — ED Provider Notes (Signed)
[facility] DEPT Provider Note   CSN: [id number] Arrival date & time: 10/28/17  1204     History   Chief Complaint Chief Complaint  Patient presents with  . Hematuria  . Urinary Retention    HPI Brett [name] is a 77 y.o. male.  He is here because he noticed some bleeding around the tip of his penis associated with blood in Foley catheter bag.  Evaluated here 2 days ago, for urinary retention and a Foley catheter was placed.  At that time he had hematuria.  He has had progressive hematuria over the last week, following removal of the nephrostomy tube which had been placed for left ureter obstruction from a large left kidney stone.  He is otherwise well, currently and denies nausea, vomiting, fever, chills, focal weakness or paresthesia.  He is due to have another nephrostomy placed, on 11/02/2016.  He is here with his wife.  There are no other known modifying factors.     HPI  Past Medical History:  Diagnosis Date  . Carotid stenosis   . Elevated PSA   . 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 Glomerulonephritis 1961  . Hyperglycemia   . Hyperlipidemia   . Hyperplastic colon polyp 11/24/2009   x7  . Hypertension   . Iron deficiency anemia due to chronic blood loss 08/15/2016  . Leukocytosis 08/02/2016  . 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  . PVD (peripheral vascular disease) ([place])   . 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 Renal calculus 10/11/2017  . Coronary artery disease involving native coronary artery of native heart without angina pectoris 12/12/2016  . 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 Iron  deficiency anemia due to chronic blood loss 08/15/2016  . 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  . 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 Elevated PSA 03/17/2016  . Pulmonary nodule 06/12/2014  . TOBACCO ABUSE 07/26/2009  . Carotid stenosis, asymptomatic, unspecified laterality 07/26/2009  . Peripheral vascular disease ([place]) 07/26/2009  . Hyperlipidemia 11/22/2007  . Essential hypertension 02/05/2007  . 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 BYPASS GRAFT  2006  . CORONARY ARTERY BYPASS GRAFT  12/07/2004   CABG x5 Dr [name]  . CYSTOSCOPY WITH LITHOLAPAXY N/A 09/25/2017   Procedure: CYSTOSCOPY WITH LITHOLAPAXY;  Surgeon: Cleon Gustin, MD;  Location: [facility];  Service: Urology;  Laterality: N/A;  . HOLMIUM LASER APPLICATION N/A 5/36/6440   Procedure: HOLMIUM LASER APPLICATION;  Surgeon: Cleon Gustin, MD;  Location: [facility];  Service: Urology;  Laterality: N/A;  . HOLMIUM LASER APPLICATION Left 10/09/7423   Procedure: HOLMIUM LASER APPLICATION;  Surgeon: Cleon Gustin, MD;  Location: [facility];  Service: Urology;  Laterality: Left;  . IR URETERAL STENT LEFT NEW ACCESS W/O SEP NEPHROSTOMY CATH  10/11/2017  . NEPHROLITHOTOMY Left 10/11/2017   Procedure: NEPHROLITHOTOMY PERCUTANEOUS;  Surgeon: Cleon Gustin, MD;  Location: [facility];  Service: Urology;  Laterality: Left;  . RIGHT HEART CATH AND CORONARY/GRAFT ANGIOGRAPHY N/A 11/21/2016   Procedure: Right Heart Cath and Coronary/Graft Angiography;  Surgeon: Sherren Mocha, MD;  Location: [facility];  Service: Cardiovascular;  Laterality: N/A;  . TEE WITHOUT CARDIOVERSION N/A 12/05/2016   Procedure: TRANSESOPHAGEAL ECHOCARDIOGRAM (TEE);  Surgeon: Sherren Mocha, MD;  Location: [facility];  Service: Open Heart Surgery;  Laterality: N/A;  . TRANSCATHETER AORTIC VALVE REPLACEMENT, TRANSFEMORAL N/A 12/05/2016   Procedure: TRANSCATHETER AORTIC VALVE REPLACEMENT,  TRANSFEMORAL;  Surgeon: Sherren Mocha, MD;  Location: [facility];  Service: Open Heart Surgery;  Laterality: N/A;        Home Medications    Prior to Admission medications   Medication Sig Start Date End Date Taking? Authorizing Provider  acetaminophen (TYLENOL) 500 MG tablet Take 500 mg by mouth every 6 (six) hours as needed for moderate pain or headache.    Yes [provider]  alfuzosin (UROXATRAL) 10 MG 24 hr tablet Take 10 mg by mouth every evening.    Yes [provider]  amLODipine (NORVASC) 5 MG tablet Take 1 tablet (5 mg total) by mouth daily. 12/27/16  Yes Sherren Mocha, MD  aspirin EC 81 MG EC tablet Take 1 tablet (81 mg total) by mouth daily. 12/07/16  Yes Gold, Wayne E, PA-C  atorvastatin (LIPITOR) 40 MG tablet TAKE 1 TABLET DAILY Patient taking differently: Take 40 mg by mouth daily 08/03/17  Yes Sherren Mocha, MD  finasteride (PROSCAR) 5 MG tablet Take 5 mg by mouth daily. 09/04/16  Yes [provider]  Inositol Niacinate (NIACIN FLUSH FREE) 500 MG CAPS Take 500 mg by mouth at bedtime.   Yes [provider]  metoprolol tartrate (LOPRESSOR) 25 MG tablet Take 1 tablet (25 mg total) by mouth 2 (two) times daily. 12/27/16  Yes Sherren Mocha, MD  mirabegron ER (MYRBETRIQ) 50 MG TB24 tablet Take 50 mg by mouth every evening.   Yes [provider]  niacin (NIASPAN) 1000 MG CR tablet TAKE 1 TABLET AT BEDTIME Patient taking differently: Take 1000 mg by mouth at bedtime 01/29/17  Yes Marin Olp, MD  oxyCODONE-acetaminophen (PERCOCET/ROXICET) 5-325 MG tablet Take 1-2 tablets by mouth every 8 (eight) hours as needed for moderate pain. 10/18/17  Yes Davonna Belling, MD  tolterodine (DETROL LA) 4 MG 24 hr capsule Take 4 mg by mouth daily.  08/31/17  Yes [provider]  HYDROcodone-acetaminophen (NORCO) 5-325 MG tablet Take 1 tablet by mouth every 6 (six) hours as needed for moderate pain. Patient not taking: Reported on 10/18/2017  09/25/17   Cleon Gustin, MD    Family History Family History  Problem Relation Age of Onset  . Heart attack Father   . Dementia Mother   . Colon cancer Neg Hx   . Colon polyps Neg Hx   . Rectal cancer Neg Hx   . Stomach cancer Neg Hx     Social History Social History   Tobacco Use  . Smoking status: Former Smoker    Packs/day: 1.00    Years: 40.00    Pack years: 40.00    Types: Cigarettes    Last attempt to quit: 01/01/2016    Years since quitting: 1.8  . Smokeless tobacco: Never Used  Substance Use Topics  . Alcohol use: No    Alcohol/week: 0.0 oz    Frequency: Never  . Drug use: No     Allergies   Levaquin [levofloxacin in d5w]   Review of Systems Review of Systems  All other systems reviewed and are negative.    Physical Exam Updated Vital Signs BP (!) 145/89 (BP Location: Left Arm)  Pulse 87   Temp 99.2 F (37.3 C) (Oral)   Resp 18   Ht 5\' 6"  (1.676 m)   Wt 66.2 kg (146 lb)   SpO2 100%   BMI 23.57 kg/m   Physical Exam  Constitutional: He is oriented to person, place, and time. He appears well-developed. No distress.  Elderly, frail  HENT:  Head: Normocephalic and atraumatic.  Right Ear: External ear normal.  Left Ear: External ear normal.  Eyes: Pupils are equal, round, and reactive to light. Conjunctivae and EOM are normal.  Neck: Normal range of motion and phonation normal. Neck supple.  Cardiovascular: Normal rate, regular rhythm and normal heart sounds.  Pulmonary/Chest: Effort normal and breath sounds normal. He exhibits no bony tenderness.  Abdominal: Soft. He exhibits no mass. There is no tenderness. There is no guarding.  Genitourinary:  Genitourinary Comments: Foley catheter per urethra.  Bright red blood in Foley bag and tubing.  Penis and scrotum and scrotal contents otherwise normal appearance.  Musculoskeletal: Normal range of motion.  Normal gait  Neurological: He is alert and oriented to person, place, and time. No  cranial nerve deficit or sensory deficit. He exhibits normal muscle tone. Coordination normal.  Skin: Skin is warm, dry and intact.  Psychiatric: He has a normal mood and affect. His behavior is normal. Judgment and thought content normal.  Nursing note and vitals reviewed.    ED Treatments / Results  Labs (all labs ordered are listed, but only abnormal results are displayed) Labs Reviewed - No data to display  EKG None  Radiology Ct Renal Stone Study  Result Date: 10/26/2017 CLINICAL DATA:  Known stone. Post lithotomy and stent placement. Scheduled for additional procedure 11/02/2017. Now with urinary retention and suprapubic pain/hematuria. EXAM: CT ABDOMEN AND PELVIS WITHOUT CONTRAST TECHNIQUE: Multidetector CT imaging of the abdomen and pelvis was performed following the standard protocol without IV contrast. COMPARISON:  10/15/2017 and 11/27/2016 FINDINGS: Lower chest: Lung bases are within normal. Calcification of the mitral valve annulus. Hepatobiliary: Minimal cholelithiasis. Liver and biliary tree are within normal. Pancreas: Normal. Spleen: Normal. Adrenals/Urinary Tract: Adrenal glands are normal. Kidneys are normal in size. There is mild bilateral nephrolithiasis left worse than right. Interval removal of percutaneous left-sided nephrostomy catheter. Double-J left-sided internal ureteral stent is in adequate position. Minimal increased density to the left renal pelvis which may be due to recent procedure with contrast administration versus minimal hemorrhage. Minimal prominence of the left intrarenal collecting system. Mild stranding of the left perinephric fat. Several right renal cysts unchanged. Right ureter is normal. Foley catheter is present within the bladder. Mild increased density to the fluid within the bladder which may again be due to recent contrast seizure. Two small calcifications over the bladder which may be recently passed stones. Stomach/Bowel: Stomach and small bowel  are normal. Appendix is normal. Mild diverticulosis of the colon. Vascular/Lymphatic: Moderate calcified plaque over the abdominal aorta and iliac arteries. 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 No adenopathy. Reproductive: Normal. Other: None. Musculoskeletal: Moderate degenerative changes of the spine and mild degenerate change of the hips. Multi level disc disease involving the lumbar spine. IMPRESSION: Interval removal of percutaneous left nephrostomy catheter. Left-sided double-J internal ureteral stent in adequate position. Minimal prominence of the left intrarenal collecting system with multiple left renal stones visualized. Mild right-sided nephrolithiasis and a couple tiny stones within the bladder. Colonic diverticulosis. Right renal cysts unchanged. Minimal cholelithiasis. Short segment infrarenal aortic  dissection unchanged. Aortic Atherosclerosis (ICD10-I70.0). Electronically Signed   By: Marin Olp M.D.   On: 10/26/2017 21:16    Procedures Procedures (including critical care time)  Medications Ordered in ED Medications - No data to display   Initial Impression / Assessment and Plan / ED Course  I have reviewed the triage vital signs and the nursing notes.  Pertinent labs & imaging results that were available during my care of the patient were reviewed by me and considered in my medical decision making (see chart for details).      Patient Vitals for the past 24 hrs:  BP Temp Temp src Pulse Resp SpO2 Height Weight  10/28/17 1451 (!) 145/89 99.2 F (37.3 C) Oral 87 18 100 % - -  10/28/17 1225 - - - - - - 5\' 6"  (1.676 m) 66.2 kg (146 lb)  10/28/17 1223 (!) 106/51 98 F (36.7 C) Oral 86 18 100 % - -    2:11 PM Reevaluation with update and discussion. After initial assessment and treatment, an updated evaluation reveals urinary bladder has been irrigated by nursing, and returns now with pink tinged  urine/irrigant solution.  Small clots initially obtained, have resolved.  Findings discussed with patient and wife, all questions answered. Daleen Bo      Final Clinical Impressions(s) / ED Diagnoses   Final diagnoses:  Hematuria, unspecified type  Problem with Foley catheter, subsequent encounter   Hematuria with known urinary tract stones.  No evidence for significant acute infection, metabolic instability or obstruction at this time.  Vital signs are reassuring.  Nursing Notes Reviewed/ Care Coordinated Applicable Imaging Reviewed Interpretation of Laboratory Data incorporated into ED treatment  The patient appears reasonably screened and/or stabilized for discharge and I doubt any other medical condition or other [facility] requiring further screening, evaluation, or treatment in the ED at this time prior to discharge.  Plan: Home Medications-continue current; Home Treatments-Foley care at home, push oral fluids; return here if the recommended treatment, does not improve the symptoms; Recommended follow up-contact urology for follow-up as needed and proceed with planned surgical intervention in 5 days   ED Discharge Orders    None       Daleen Bo, MD 10/28/17 1540

## 2017-10-28 NOTE — ED Notes (Signed)
We irrigated his foley (which was easy to do). We obtained a few small blood clots, after which the catheter showed very good flow.

## 2017-10-28 NOTE — ED Triage Notes (Signed)
Pt presents today due to a fever and blood in his urine. Pt states he was soaked I his urine this morning. Pt has taken Tylenol to relieve his fever. Pt was seen two days ago for retention and had a foley placed. Pt states blood is leaking around the catheter.

## 2017-10-28 NOTE — Discharge Instructions (Signed)
Follow-up for treatment by your urologist, on Friday as scheduled.  Call him sooner if needed, for problems.

## 2017-10-29 LAB — URINE CULTURE: Culture: 50000 — AB

## 2017-10-30 NOTE — Progress Notes (Signed)
ED Antimicrobial Stewardship Positive Culture Follow Up   Brett [name] is an 77 y.o. male who presented to [facility] on 10/26/2017 with a chief complaint of No chief complaint on file.   Recent Results (from the past 720 hour(s))  Urine culture     Status: Abnormal   Collection Time: 10/26/17  9:49 PM  Result Value Ref Range Status   Specimen Description   Final    URINE, RANDOM Performed at [facility] [address], [place], [place] [address]    Special Requests   Final    NONE Performed at [facility], [place] [address], [place], [place] [address]    Culture (A)  Final    50,000 COLONIES/mL STAPHYLOCOCCUS SPECIES (COAGULASE NEGATIVE)   Report Status 10/29/2017 FINAL  Final   Organism ID, Bacteria STAPHYLOCOCCUS SPECIES (COAGULASE NEGATIVE) (A)  Final      Susceptibility   Staphylococcus species (coagulase negative) - MIC*    CIPROFLOXACIN <=0.5 SENSITIVE Sensitive     GENTAMICIN <=0.5 SENSITIVE Sensitive     NITROFURANTOIN <=16 SENSITIVE Sensitive     OXACILLIN >=4 RESISTANT Resistant     TETRACYCLINE 2 SENSITIVE Sensitive     VANCOMYCIN 2 SENSITIVE Sensitive     TRIMETH/SULFA <=10 SENSITIVE Sensitive     CLINDAMYCIN <=0.25 SENSITIVE Sensitive     RIFAMPIN <=0.5 SENSITIVE Sensitive     Inducible Clindamycin NEGATIVE Sensitive     * 50,000 COLONIES/mL STAPHYLOCOCCUS SPECIES (COAGULASE NEGATIVE)    77 year old male s/p nephrostolithotomy with an upcoming scheduled procedure for additional stone removal. He was seen in the ED on 3/22 and 3/24 and had a foley placed with urology follow-up.  His UA is difficult to interpret due to hematuria, and no WBCs are noted on Urine Micro.  No antibiotics were prescribed.  Will send this result to urology for review prior to his scheduled procedure on 3/29.  Urologist is Nicolette Bang, MD.  Fax number [phone].   Norva Riffle 10/30/2017, 10:51 AM Infectious Diseases  Pharmacist Phone# [phone]

## 2017-10-31 DIAGNOSIS — N3289 Other specified disorders of bladder: Secondary | ICD-10-CM | POA: Diagnosis not present

## 2017-10-31 DIAGNOSIS — Z7982 Long term (current) use of aspirin: Secondary | ICD-10-CM | POA: Diagnosis not present

## 2017-10-31 DIAGNOSIS — Z952 Presence of prosthetic heart valve: Secondary | ICD-10-CM | POA: Insufficient documentation

## 2017-10-31 DIAGNOSIS — Z79899 Other long term (current) drug therapy: Secondary | ICD-10-CM | POA: Insufficient documentation

## 2017-10-31 DIAGNOSIS — N10 Acute tubulo-interstitial nephritis: Secondary | ICD-10-CM | POA: Insufficient documentation

## 2017-10-31 DIAGNOSIS — K429 Umbilical hernia without obstruction or gangrene: Secondary | ICD-10-CM | POA: Diagnosis not present

## 2017-10-31 DIAGNOSIS — Z87891 Personal history of nicotine dependence: Secondary | ICD-10-CM | POA: Diagnosis not present

## 2017-10-31 DIAGNOSIS — R109 Unspecified abdominal pain: Secondary | ICD-10-CM | POA: Diagnosis present

## 2017-10-31 DIAGNOSIS — I10 Essential (primary) hypertension: Secondary | ICD-10-CM | POA: Diagnosis not present

## 2017-10-31 DIAGNOSIS — I2510 Atherosclerotic heart disease of native coronary artery without angina pectoris: Secondary | ICD-10-CM | POA: Insufficient documentation

## 2017-10-31 DIAGNOSIS — Z87442 Personal history of urinary calculi: Secondary | ICD-10-CM | POA: Insufficient documentation

## 2017-10-31 DIAGNOSIS — R301 Vesical tenesmus: Secondary | ICD-10-CM | POA: Diagnosis not present

## 2017-10-31 DIAGNOSIS — Z951 Presence of aortocoronary bypass graft: Secondary | ICD-10-CM | POA: Diagnosis not present

## 2017-11-01 ENCOUNTER — Encounter (HOSPITAL_COMMUNITY): Payer: Self-pay | Admitting: Emergency Medicine

## 2017-11-01 ENCOUNTER — Other Ambulatory Visit: Payer: Self-pay

## 2017-11-01 ENCOUNTER — Emergency Department (HOSPITAL_COMMUNITY)
Admission: EM | Admit: 2017-11-01 | Discharge: 2017-11-01 | Disposition: A | Discharge status: Home / Self Care | Payer: Medicare Other | Attending: Emergency Medicine | Admitting: Emergency Medicine

## 2017-11-01 ENCOUNTER — Emergency Department (HOSPITAL_COMMUNITY): Payer: Medicare Other

## 2017-11-01 DIAGNOSIS — N3289 Other specified disorders of bladder: Secondary | ICD-10-CM

## 2017-11-01 DIAGNOSIS — N10 Acute tubulo-interstitial nephritis: Secondary | ICD-10-CM

## 2017-11-01 DIAGNOSIS — K429 Umbilical hernia without obstruction or gangrene: Secondary | ICD-10-CM | POA: Diagnosis not present

## 2017-11-01 LAB — URINALYSIS, ROUTINE W REFLEX MICROSCOPIC
Bilirubin Urine: NEGATIVE
Glucose, UA: NEGATIVE mg/dL
Ketones, ur: 5 mg/dL — AB
Nitrite: POSITIVE — AB
PROTEIN: 30 mg/dL — AB
SPECIFIC GRAVITY, URINE: 1.014 (ref 1.005–1.030)
pH: 5 (ref 5.0–8.0)

## 2017-11-01 LAB — CBC WITH DIFFERENTIAL/PLATELET
BASOS ABS: 0 10*3/uL (ref 0.0–0.1)
BASOS PCT: 0 %
EOS PCT: 1 %
Eosinophils Absolute: 0.1 10*3/uL (ref 0.0–0.7)
HEMATOCRIT: 27.8 % — AB (ref 39.0–52.0)
Hemoglobin: 9.3 g/dL — ABNORMAL LOW (ref 13.0–17.0)
Lymphocytes Relative: 20 %
Lymphs Abs: 2 10*3/uL (ref 0.7–4.0)
MCH: 32.5 pg (ref 26.0–34.0)
MCHC: 33.5 g/dL (ref 30.0–36.0)
MCV: 97.2 fL (ref 78.0–100.0)
MONO ABS: 1 10*3/uL (ref 0.1–1.0)
MONOS PCT: 10 %
NEUTROS ABS: 6.8 10*3/uL (ref 1.7–7.7)
Neutrophils Relative %: 69 %
Platelets: 322 10*3/uL (ref 150–400)
RBC: 2.86 MIL/uL — ABNORMAL LOW (ref 4.22–5.81)
RDW: 13.5 % (ref 11.5–15.5)
WBC: 9.8 10*3/uL (ref 4.0–10.5)

## 2017-11-01 LAB — BASIC METABOLIC PANEL
ANION GAP: 10 (ref 5–15)
BUN: 21 mg/dL — ABNORMAL HIGH (ref 6–20)
CALCIUM: 9.1 mg/dL (ref 8.9–10.3)
CO2: 24 mmol/L (ref 22–32)
CREATININE: 1.57 mg/dL — AB (ref 0.61–1.24)
Chloride: 105 mmol/L (ref 101–111)
GFR, EST AFRICAN AMERICAN: 48 mL/min — AB (ref >60)
GFR, EST NON AFRICAN AMERICAN: 41 mL/min — AB (ref >60)
GLUCOSE: 117 mg/dL — AB (ref 65–99)
Potassium: 3.8 mmol/L (ref 3.5–5.1)
Sodium: 139 mmol/L (ref 135–145)

## 2017-11-01 MED ORDER — OXYBUTYNIN CHLORIDE ER 10 MG PO TB24: 10.0000 mg | ORAL_TABLET | Freq: Every day | ORAL | 0 refills | Status: DC

## 2017-11-01 MED ORDER — OXYBUTYNIN CHLORIDE 5 MG PO TABS
5.0000 mg | ORAL_TABLET | Freq: Three times a day (TID) | ORAL | Status: DC
  Administered 2017-11-01: 5 mg via ORAL
  Filled 2017-11-01: qty 1

## 2017-11-01 MED ORDER — SODIUM CHLORIDE 0.9 % IV BOLUS: 1000.0000 mL | Freq: Once | INTRAVENOUS | Status: DC

## 2017-11-01 MED ORDER — CEPHALEXIN 500 MG PO CAPS
500.0000 mg | ORAL_CAPSULE | Freq: Once | ORAL | Status: AC
Start: 2017-11-01 — End: 2017-11-01
  Administered 2017-11-01: 500 mg via ORAL
  Filled 2017-11-01: qty 1

## 2017-11-01 MED ORDER — CEPHALEXIN 500 MG PO CAPS: 500.0000 mg | ORAL_CAPSULE | Freq: Three times a day (TID) | ORAL | 0 refills | Status: DC

## 2017-11-01 NOTE — Discharge Instructions (Signed)
The results in the ER show that you have likely bladder infection.  Which would explain the symptoms you are having.  Given that you are complaining of back pain, this could be kidney infection.  Therefore we are starting you on long term antibiotic dose.  Please notify your urologist about this today.

## 2017-11-01 NOTE — Anesthesia Preprocedure Evaluation (Addendum)
Anesthesia Evaluation  Patient identified by MRN, date of birth, ID band Patient awake    Reviewed: Allergy & Precautions, NPO status , Patient's Chart, lab work & pertinent test results  Airway Mallampati: II  TM Distance: >3 FB Neck ROM: Full    Dental no notable dental hx. (+) Dental Advisory Given, Implants   Pulmonary neg pulmonary ROS, former smoker,    Pulmonary exam normal breath sounds clear to auscultation       Cardiovascular Exercise Tolerance: Good hypertension, + CAD and + Peripheral Vascular Disease  Normal cardiovascular exam Rhythm:Regular Rate:Normal     Neuro/Psych negative neurological ROS     GI/Hepatic negative GI ROS, Neg liver ROS,   Endo/Other    Renal/GU Renal disease     Musculoskeletal   Abdominal   Peds  Hematology  (+) anemia ,   Anesthesia Other Findings   Reproductive/Obstetrics                            Lab Results  Component Value Date   WBC 9.8 11/01/2017   HGB 9.3 (L) 11/01/2017   HCT 27.8 (L) 11/01/2017   MCV 97.2 11/01/2017   PLT 322 11/01/2017    Anesthesia Physical Anesthesia Plan  ASA: III  Anesthesia Plan: General   Post-op Pain Management:    Induction: Intravenous  PONV Risk Score and Plan: 2 and Treatment may vary due to age or medical condition  Airway Management Planned: LMA  Additional Equipment:   Intra-op Plan:   Post-operative Plan:   Informed Consent: I have reviewed the patients History and Physical, chart, labs and discussed the procedure including the risks, benefits and alternatives for the proposed anesthesia with the patient or authorized representative who has indicated his/her understanding and acceptance.     Plan Discussed with:   Anesthesia Plan Comments:         Anesthesia Quick Evaluation

## 2017-11-01 NOTE — ED Triage Notes (Signed)
Pt states he has a catheter and it is not draining

## 2017-11-01 NOTE — ED Provider Notes (Addendum)
[facility] DEPT Provider Note   CSN: [id number] Arrival date & time: 10/31/17  2339     History   Chief Complaint Chief Complaint  Patient presents with  . Urinary Retention    HPI Brett [name] is a 77 y.o. male.  HPI 77 year old male comes in with chief complaint of urinary retention. Patient has history of CAD status post CABG and TAVR.  Patient also has known BPH and has had some complicated stones recently.  Patient states that about a month ago he developed nephrolithiasis, and a status post now left-sided ureteral stent placement and at one point required nephrostomy tube.  Patient subsequently had some complications with the nephrostomy tube and a Foley catheter was placed, with a repeat procedure scheduled for tomorrow.  Patient started having spasms during the evening yesterday and was unable to sleep.  Patient states that he has not emptied his Foley bag since 1 PM.  Patient denies any gross hematuria.  Review of system is positive for left flank pain, there are no fevers or chills.  Past Medical History:  Diagnosis Date  . Carotid stenosis   . Elevated PSA   . 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 Glomerulonephritis 1961  . Hyperglycemia   . Hyperlipidemia   . Hyperplastic colon polyp 11/24/2009   x7  . Hypertension   . Iron deficiency anemia due to chronic blood loss 08/15/2016  . Leukocytosis 08/02/2016  . 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  . PVD (peripheral vascular disease) ([place])   . 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 Renal calculus 10/11/2017  . Coronary artery disease involving native coronary artery of  native heart without angina pectoris 12/12/2016  . 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 Iron deficiency anemia due to chronic blood loss 08/15/2016  . 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  . 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 Elevated PSA 03/17/2016  . Pulmonary nodule 06/12/2014  . TOBACCO ABUSE 07/26/2009  . Carotid stenosis, asymptomatic, unspecified laterality 07/26/2009  . Peripheral vascular disease ([place]) 07/26/2009  . Hyperlipidemia 11/22/2007  . Essential hypertension 02/05/2007  . 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 BYPASS GRAFT  2006  . CORONARY ARTERY BYPASS GRAFT  12/07/2004   CABG x5 Dr [name]  . CYSTOSCOPY WITH LITHOLAPAXY N/A 09/25/2017   Procedure: CYSTOSCOPY WITH LITHOLAPAXY;  Surgeon: Cleon Gustin, MD;  Location: [facility];  Service: Urology;  Laterality: N/A;  . HOLMIUM LASER APPLICATION N/A 2/33/0076   Procedure: HOLMIUM LASER APPLICATION;  Surgeon: Cleon Gustin, MD;  Location: [facility];  Service: Urology;  Laterality: N/A;  . HOLMIUM LASER APPLICATION Left 09/09/6331   Procedure: HOLMIUM LASER APPLICATION;  Surgeon: Cleon Gustin, MD;  Location: [facility];  Service: Urology;  Laterality: Left;  . IR URETERAL STENT LEFT NEW ACCESS W/O SEP NEPHROSTOMY CATH  10/11/2017  . NEPHROLITHOTOMY Left 10/11/2017   Procedure: NEPHROLITHOTOMY PERCUTANEOUS;  Surgeon: Cleon Gustin, MD;  Location[facility];  Service: Urology;  Laterality: Left;  . RIGHT HEART CATH AND CORONARY/GRAFT ANGIOGRAPHY N/A 11/21/2016   Procedure:  Right Heart Cath and Coronary/Graft Angiography;  Surgeon: Sherren Mocha, MD;  Location: [facility];  Service: Cardiovascular;  Laterality: N/A;  . TEE WITHOUT CARDIOVERSION N/A 12/05/2016   Procedure: TRANSESOPHAGEAL ECHOCARDIOGRAM (TEE);  Surgeon: Sherren Mocha, MD;  Location: [facility];  Service: Open Heart Surgery;  Laterality: N/A;    . TRANSCATHETER AORTIC VALVE REPLACEMENT, TRANSFEMORAL N/A 12/05/2016   Procedure: TRANSCATHETER AORTIC VALVE REPLACEMENT, TRANSFEMORAL;  Surgeon: Sherren Mocha, MD;  Location: [facility];  Service: Open Heart Surgery;  Laterality: N/A;        Home Medications    Prior to Admission medications   Medication Sig Start Date End Date Taking? Authorizing Provider  acetaminophen (TYLENOL) 500 MG tablet Take 500 mg by mouth every 6 (six) hours as needed for moderate pain or headache.    Yes [provider]  acidophilus (RISAQUAD) CAPS capsule Take 1 capsule by mouth daily.   Yes [provider]  alfuzosin (UROXATRAL) 10 MG 24 hr tablet Take 10 mg by mouth every evening.    Yes [provider]  amLODipine (NORVASC) 5 MG tablet Take 1 tablet (5 mg total) by mouth daily. 12/27/16  Yes Sherren Mocha, MD  aspirin EC 81 MG EC tablet Take 1 tablet (81 mg total) by mouth daily. 12/07/16  Yes Gold, Wayne E, PA-C  atorvastatin (LIPITOR) 40 MG tablet TAKE 1 TABLET DAILY Patient taking differently: Take 40 mg by mouth daily 08/03/17  Yes Sherren Mocha, MD  finasteride (PROSCAR) 5 MG tablet Take 5 mg by mouth daily. 09/04/16  Yes [provider]  Inositol Niacinate (NIACIN FLUSH FREE) 500 MG CAPS Take 1 capsule by mouth daily.   Yes [provider]  metoprolol tartrate (LOPRESSOR) 25 MG tablet Take 1 tablet (25 mg total) by mouth 2 (two) times daily. 12/27/16  Yes Sherren Mocha, MD  niacin (NIASPAN) 1000 MG CR tablet TAKE 1 TABLET AT BEDTIME Patient taking differently: Take 1000 mg by mouth at bedtime 01/29/17  Yes Marin Olp, MD  oxyCODONE-acetaminophen (PERCOCET/ROXICET) 5-325 MG tablet Take 1-2 tablets by mouth every 8 (eight) hours as needed for moderate pain. 10/18/17  Yes Davonna Belling, MD  tolterodine (DETROL LA) 4 MG 24 hr capsule Take 4 mg by mouth daily.  08/31/17  Yes [provider]  cephALEXin (KEFLEX) 500 MG capsule Take 1 capsule (500 mg  total) by mouth 3 (three) times daily. 11/01/17   Varney Biles, MD  HYDROcodone-acetaminophen (NORCO) 5-325 MG tablet Take 1 tablet by mouth every 6 (six) hours as needed for moderate pain. Patient not taking: Reported on 10/18/2017 09/25/17   Cleon Gustin, MD    Family History Family History  Problem Relation Age of Onset  . Heart attack Father   . Dementia Mother   . Colon cancer Neg Hx   . Colon polyps Neg Hx   . Rectal cancer Neg Hx   . Stomach cancer Neg Hx     Social History Social History   Tobacco Use  . Smoking status: Former Smoker    Packs/day: 1.00    Years: 40.00    Pack years: 40.00    Types: Cigarettes    Last attempt to quit: 01/01/2016    Years since quitting: 1.8  . Smokeless tobacco: Never Used  Substance Use Topics  . Alcohol use: No    Alcohol/week: 0.0 oz    Frequency: Never  . Drug use: No     Allergies   Levaquin [levofloxacin in d5w]  Review of Systems Review of Systems  Constitutional: Positive for activity change. Negative for fever.  Gastrointestinal: Positive for abdominal pain.  Allergic/Immunologic: Negative for immunocompromised state.  Hematological: Does not bruise/bleed easily.     Physical Exam Updated Vital Signs BP (!) 137/58 (BP Location: Left Arm)   Pulse 87   Temp 98 F (36.7 C) (Oral)   Resp 17   Ht 5\' 6"  (1.676 m)   Wt 65.8 kg (145 lb)   SpO2 96%   BMI 23.40 kg/m   Physical Exam  Constitutional: He is oriented to person, place, and time. He appears well-developed.  HENT:  Head: Atraumatic.  Neck: Neck supple.  Cardiovascular: Normal rate.  Pulmonary/Chest: Effort normal.  Abdominal: Soft. There is no tenderness.  Neurological: He is alert and oriented to person, place, and time.  Skin: Skin is warm.  Nursing note and vitals reviewed.    ED Treatments / Results  Labs (all labs ordered are listed, but only abnormal results are displayed) Labs Reviewed  CBC WITH DIFFERENTIAL/PLATELET -  Abnormal; Notable for the following components:      Result Value   RBC 2.86 (*)    Hemoglobin 9.3 (*)    HCT 27.8 (*)    All other components within normal limits  BASIC METABOLIC PANEL - Abnormal; Notable for the following components:   Glucose, Bld 117 (*)    BUN 21 (*)    Creatinine, Ser 1.57 (*)    GFR calc non Af Amer 41 (*)    GFR calc Af Amer 48 (*)    All other components within normal limits  URINALYSIS, ROUTINE W REFLEX MICROSCOPIC - Abnormal; Notable for the following components:   Color, Urine AMBER (*)    APPearance CLOUDY (*)    Hgb urine dipstick LARGE (*)    Ketones, ur 5 (*)    Protein, ur 30 (*)    Nitrite POSITIVE (*)    Leukocytes, UA LARGE (*)    Bacteria, UA FEW (*)    Squamous Epithelial / LPF 0-5 (*)    All other components within normal limits  URINE CULTURE    EKG None  Radiology Ct Renal Stone Study  Result Date: 11/01/2017 CLINICAL DATA:  77 y/o M; left nephrolithotomy and laser application 70/35/0093. Catheter no longer draining. EXAM: CT ABDOMEN AND PELVIS WITHOUT CONTRAST TECHNIQUE: Multidetector CT imaging of the abdomen and pelvis was performed following the standard protocol without IV contrast. COMPARISON:  10/26/2017 CT of abdomen. FINDINGS: Lower chest: Mitral annular calcification. Hepatobiliary: No focal liver abnormality. No biliary ductal dilatation. Cholelithiasis. Pancreas: Unremarkable. No pancreatic ductal dilatation or surrounding inflammatory changes. Spleen: Normal in size without focal abnormality. Adrenals/Urinary Tract: Normal adrenal glands. Stable right kidney interpolar approximately 6 cm cyst. Multiple nonobstructing stones in the right kidney. No right hydronephrosis. Left ureteral stent is stable in position. No left-sided hydronephrosis. There are several small densities within the bladder and left renal pelvis probably representing stone fragments. Additionally, there is mild increased attenuation along the course of prior  nephrostomy catheter and within the renal hilum which is decreased in the prior study probably representing interval dispersion of blood products. There are tiny foci of air within the lower pole of the left kidney, probably a trajectory in the presence of a Foley catheter. Foley catheter is present within a collapsed bladder. Stomach/Bowel: Stomach is within normal limits. Appendix appears normal. No evidence of bowel wall thickening, distention, or inflammatory changes. Mild colon diverticulosis without findings of diverticulitis.  Vascular/Lymphatic: Aortic atherosclerosis with moderate calcification. 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 No enlarged abdominal or pelvic lymph nodes. Reproductive: Prostate is unremarkable. Other: Small right inguinal hernia containing a loop of small bowel without obstruction or inflammatory change is stable. No abdominopelvic ascites. Musculoskeletal: Mild osteoarthrosis of the hips and moderate spondylosis of lumbar spine. IMPRESSION: 1. Left ureteral stent is stable in position. Bladder is collapsed around a Foley catheter. No hydronephrosis. 2. Tiny kidney stones/fragments are present in bladder and left renal pelvis. 3. Decreased density within lower pole of left kidney, along course of prior nephrostomy catheter, and within the left renal pelvis probably representing interval dispersion of blood products. 4. Cholelithiasis. 5. Stable small right inguinal hernia containing small bowel without obstruction or inflammation. 6. Aortic atherosclerosis and stable chronic infrarenal aorta focal dissection. Electronically Signed   By: Kristine Garbe M.D.   On: 11/01/2017 06:54    Procedures Procedures (including critical care time)  Medications Ordered in ED Medications  sodium chloride 0.9 % bolus 1,000 mL (has no administration in time range)  oxybutynin (DITROPAN) tablet 5 mg (has no administration in time range)  cephALEXin  (KEFLEX) capsule 500 mg (has no administration in time range)     Initial Impression / Assessment and Plan / ED Course  I have reviewed the triage vital signs and the nursing notes.  Pertinent labs & imaging results that were available during my care of the patient were reviewed by me and considered in my medical decision making (see chart for details).  Clinical Course as of Nov 01 900  Thu Nov 01, 2017  0757 UA is nitrite positive and leukocyte positive. Likely patient is having pyelonephritis and resultant spasms. We will continue with oxybutynin and start patient on Keflex.  Urine culture has been sent.  Patient will be assessed by urology tomorrow, and they can decide to DC antibiotics if they think it's not needed.  I spoke with Dr. Alyson Ingles, and he agrees with the plan..  Nitrite(!): POSITIVE [AN]    Clinical Course User Index [AN] Varney Biles, MD    77 year old male comes in with chief complaint of bladder spasms and suprapubic pain.  He is also having left flank pain.  Patient has an indwelling Foley catheter in place and has had recent complications with nephrolithiasis.  However bladder scan revealed only 20 cc of urine in the bladder.  Therefore it does not appear to be a postobstructive uropathy or related pain.  Given that patient has had recent instrumentation and has a left-sided stent placement differential diagnosis also includes perinephric abscess and pyelonephritis.   I discussed the case with Dr. McDiarmid.  He agrees with our concerns and recommends that we do a CT renal stone study.  If the CT scan is normal and the UA is negative then he would appreciate is treating the patient with oxybutynin.  Patient is scheduled for procedure tomorrow.  Final Clinical Impressions(s) / ED Diagnoses   Final diagnoses:  Acute pyelonephritis  Bladder spasms    ED Discharge Orders        Ordered    cephALEXin (KEFLEX) 500 MG capsule  3 times daily     11/01/17 0901         Varney Biles, MD 11/01/17 2951    Varney Biles, MD 11/01/17 [phone]

## 2017-11-01 NOTE — ED Notes (Signed)
Gently irrigated catheter with NS  Small clots noted on return  Will reassess to see if catheter is draining in a few minutes

## 2017-11-02 ENCOUNTER — Ambulatory Visit (HOSPITAL_COMMUNITY)
Admission: RE | Admit: 2017-11-02 | Discharge: 2017-11-02 | Disposition: A | Discharge status: Home / Self Care | Payer: Medicare Other | Source: Ambulatory Visit | Attending: Urology | Admitting: Urology

## 2017-11-02 ENCOUNTER — Encounter (HOSPITAL_COMMUNITY): Payer: Self-pay | Admitting: *Deleted

## 2017-11-02 ENCOUNTER — Ambulatory Visit (HOSPITAL_COMMUNITY): Payer: Medicare Other | Admitting: Anesthesiology

## 2017-11-02 ENCOUNTER — Other Ambulatory Visit: Payer: Self-pay

## 2017-11-02 ENCOUNTER — Ambulatory Visit (HOSPITAL_COMMUNITY): Payer: Medicare Other

## 2017-11-02 ENCOUNTER — Encounter (HOSPITAL_COMMUNITY)
Admission: RE | Disposition: A | Discharge status: Home / Self Care | Payer: Self-pay | Source: Ambulatory Visit | Attending: Urology

## 2017-11-02 DIAGNOSIS — Z951 Presence of aortocoronary bypass graft: Secondary | ICD-10-CM | POA: Insufficient documentation

## 2017-11-02 DIAGNOSIS — Z881 Allergy status to other antibiotic agents status: Secondary | ICD-10-CM | POA: Diagnosis not present

## 2017-11-02 DIAGNOSIS — Z8249 Family history of ischemic heart disease and other diseases of the circulatory system: Secondary | ICD-10-CM | POA: Insufficient documentation

## 2017-11-02 DIAGNOSIS — I2510 Atherosclerotic heart disease of native coronary artery without angina pectoris: Secondary | ICD-10-CM | POA: Diagnosis not present

## 2017-11-02 DIAGNOSIS — Z87891 Personal history of nicotine dependence: Secondary | ICD-10-CM | POA: Diagnosis not present

## 2017-11-02 DIAGNOSIS — Z79899 Other long term (current) drug therapy: Secondary | ICD-10-CM | POA: Diagnosis not present

## 2017-11-02 DIAGNOSIS — N200 Calculus of kidney: Secondary | ICD-10-CM

## 2017-11-02 DIAGNOSIS — N201 Calculus of ureter: Secondary | ICD-10-CM | POA: Diagnosis not present

## 2017-11-02 DIAGNOSIS — I739 Peripheral vascular disease, unspecified: Secondary | ICD-10-CM | POA: Insufficient documentation

## 2017-11-02 DIAGNOSIS — E785 Hyperlipidemia, unspecified: Secondary | ICD-10-CM | POA: Insufficient documentation

## 2017-11-02 DIAGNOSIS — Z952 Presence of prosthetic heart valve: Secondary | ICD-10-CM | POA: Insufficient documentation

## 2017-11-02 DIAGNOSIS — Z86010 Personal history of colonic polyps: Secondary | ICD-10-CM | POA: Diagnosis not present

## 2017-11-02 DIAGNOSIS — I10 Essential (primary) hypertension: Secondary | ICD-10-CM | POA: Insufficient documentation

## 2017-11-02 HISTORY — PX: CYSTOSCOPY WITH RETROGRADE PYELOGRAM, URETEROSCOPY AND STENT PLACEMENT: SHX5789

## 2017-11-02 HISTORY — PX: HOLMIUM LASER APPLICATION: SHX5852

## 2017-11-02 SURGERY — CYSTOURETEROSCOPY, WITH RETROGRADE PYELOGRAM AND STENT INSERTION
Anesthesia: General | Laterality: Left

## 2017-11-02 MED ORDER — ACETAMINOPHEN 10 MG/ML IV SOLN: 1000.0000 mg | Freq: Once | INTRAVENOUS | Status: DC | PRN

## 2017-11-02 MED ORDER — SODIUM CHLORIDE 0.9 % IV SOLN
2.0000 g | INTRAVENOUS | Status: AC
  Administered 2017-11-02: 2 g via INTRAVENOUS
  Filled 2017-11-02: qty 20

## 2017-11-02 MED ORDER — LACTATED RINGERS IV SOLN
INTRAVENOUS | Status: DC
  Administered 2017-11-02: 12:00:00 via INTRAVENOUS

## 2017-11-02 MED ORDER — PROPOFOL 10 MG/ML IV BOLUS
INTRAVENOUS | Status: AC
  Filled 2017-11-02: qty 20

## 2017-11-02 MED ORDER — FENTANYL CITRATE (PF) 100 MCG/2ML IJ SOLN
INTRAMUSCULAR | Status: AC
  Filled 2017-11-02: qty 2

## 2017-11-02 MED ORDER — MEPERIDINE HCL 50 MG/ML IJ SOLN: 6.2500 mg | INTRAMUSCULAR | Status: DC | PRN

## 2017-11-02 MED ORDER — HYDROCODONE-ACETAMINOPHEN 7.5-325 MG PO TABS: 1.0000 | ORAL_TABLET | Freq: Once | ORAL | Status: DC | PRN

## 2017-11-02 MED ORDER — PROPOFOL 10 MG/ML IV BOLUS
INTRAVENOUS | Status: DC | PRN
  Administered 2017-11-02: 120 mg via INTRAVENOUS

## 2017-11-02 MED ORDER — HYDROMORPHONE HCL 1 MG/ML IJ SOLN: 0.2500 mg | INTRAMUSCULAR | Status: DC | PRN

## 2017-11-02 MED ORDER — ONDANSETRON HCL 4 MG/2ML IJ SOLN
INTRAMUSCULAR | Status: DC | PRN
  Administered 2017-11-02: 4 mg via INTRAVENOUS

## 2017-11-02 MED ORDER — LIDOCAINE 2% (20 MG/ML) 5 ML SYRINGE
INTRAMUSCULAR | Status: AC
  Filled 2017-11-02: qty 5

## 2017-11-02 MED ORDER — ALBUMIN HUMAN 5 % IV SOLN
INTRAVENOUS | Status: DC | PRN
  Administered 2017-11-02: 14:00:00 via INTRAVENOUS

## 2017-11-02 MED ORDER — PROMETHAZINE HCL 25 MG/ML IJ SOLN: 6.2500 mg | INTRAMUSCULAR | Status: DC | PRN

## 2017-11-02 MED ORDER — PHENYLEPHRINE 40 MCG/ML (10ML) SYRINGE FOR IV PUSH (FOR BLOOD PRESSURE SUPPORT)
PREFILLED_SYRINGE | INTRAVENOUS | Status: AC
  Filled 2017-11-02: qty 10

## 2017-11-02 MED ORDER — IOHEXOL 300 MG/ML  SOLN
INTRAMUSCULAR | Status: DC | PRN
  Administered 2017-11-02: 4 mL

## 2017-11-02 MED ORDER — SODIUM CHLORIDE 0.9 % IR SOLN
Status: DC | PRN
  Administered 2017-11-02: 6000 mL

## 2017-11-02 MED ORDER — PHENYLEPHRINE HCL 10 MG/ML IJ SOLN
INTRAMUSCULAR | Status: AC
  Filled 2017-11-02: qty 1

## 2017-11-02 MED ORDER — LIDOCAINE 2% (20 MG/ML) 5 ML SYRINGE
INTRAMUSCULAR | Status: DC | PRN
  Administered 2017-11-02: 100 mg via INTRAVENOUS

## 2017-11-02 MED ORDER — ALBUMIN HUMAN 5 % IV SOLN
INTRAVENOUS | Status: AC
  Filled 2017-11-02: qty 250

## 2017-11-02 MED ORDER — OXYCODONE-ACETAMINOPHEN 5-325 MG PO TABS: 1.0000 | ORAL_TABLET | ORAL | 0 refills | Status: DC | PRN

## 2017-11-02 MED ORDER — DEXAMETHASONE SODIUM PHOSPHATE 10 MG/ML IJ SOLN
INTRAMUSCULAR | Status: AC
  Filled 2017-11-02: qty 1

## 2017-11-02 MED ORDER — FENTANYL CITRATE (PF) 100 MCG/2ML IJ SOLN
INTRAMUSCULAR | Status: DC | PRN
  Administered 2017-11-02 (×2): 25 ug via INTRAVENOUS

## 2017-11-02 MED ORDER — PHENYLEPHRINE 40 MCG/ML (10ML) SYRINGE FOR IV PUSH (FOR BLOOD PRESSURE SUPPORT)
PREFILLED_SYRINGE | INTRAVENOUS | Status: DC | PRN
  Administered 2017-11-02: 80 ug via INTRAVENOUS
  Administered 2017-11-02: 120 ug via INTRAVENOUS
  Administered 2017-11-02: 80 ug via INTRAVENOUS

## 2017-11-02 MED ORDER — PHENYLEPHRINE HCL 10 MG/ML IJ SOLN
INTRAVENOUS | Status: DC | PRN
  Administered 2017-11-02: 50 ug/min via INTRAVENOUS

## 2017-11-02 MED ORDER — DEXAMETHASONE SODIUM PHOSPHATE 10 MG/ML IJ SOLN
INTRAMUSCULAR | Status: DC | PRN
  Administered 2017-11-02: 10 mg via INTRAVENOUS

## 2017-11-02 MED ORDER — ONDANSETRON HCL 4 MG/2ML IJ SOLN
INTRAMUSCULAR | Status: AC
  Filled 2017-11-02: qty 2

## 2017-11-02 MED ORDER — CEPHALEXIN 500 MG PO CAPS: 500.0000 mg | ORAL_CAPSULE | Freq: Four times a day (QID) | ORAL | 0 refills | Status: DC

## 2017-11-02 SURGICAL SUPPLY — 23 items
BAG URO CATCHER STRL LF (MISCELLANEOUS) ×3 IMPLANT
CATH INTERMIT  6FR 70CM (CATHETERS) ×3 IMPLANT
CLOTH BEACON ORANGE TIMEOUT ST (SAFETY) ×3 IMPLANT
COVER FOOTSWITCH UNIV (MISCELLANEOUS) IMPLANT
COVER SURGICAL LIGHT HANDLE (MISCELLANEOUS) ×3 IMPLANT
EXTRACTOR STONE NITINOL NGAGE (UROLOGICAL SUPPLIES) ×3 IMPLANT
FIBER LASER FLEXIVA 1000 (UROLOGICAL SUPPLIES) IMPLANT
FIBER LASER FLEXIVA 365 (UROLOGICAL SUPPLIES) IMPLANT
FIBER LASER FLEXIVA 550 (UROLOGICAL SUPPLIES) IMPLANT
FIBER LASER TRAC TIP (UROLOGICAL SUPPLIES) ×3 IMPLANT
GLOVE BIO SURGEON STRL SZ8 (GLOVE) ×3 IMPLANT
GOWN STRL REUS W/TWL XL LVL3 (GOWN DISPOSABLE) ×3 IMPLANT
GUIDEWIRE ANG ZIPWIRE 038X150 (WIRE) ×3 IMPLANT
GUIDEWIRE STR DUAL SENSOR (WIRE) ×3 IMPLANT
IV NS 1000ML (IV SOLUTION)
IV NS 1000ML BAXH (IV SOLUTION) IMPLANT
MANIFOLD NEPTUNE II (INSTRUMENTS) ×3 IMPLANT
PACK CYSTO (CUSTOM PROCEDURE TRAY) ×3 IMPLANT
SHEATH URETERAL 12FRX35CM (MISCELLANEOUS) ×3 IMPLANT
STENT CONTOUR 6FRX26X.038 (STENTS) ×3 IMPLANT
TUBE FEEDING 8FR 16IN STR KANG (MISCELLANEOUS) IMPLANT
TUBING CONNECTING 10 (TUBING) ×2 IMPLANT
TUBING CONNECTING 10' (TUBING) ×1

## 2017-11-02 NOTE — H&P (Signed)
Urology Admission H&P  Chief Complaint: left flank pain  History of Present Illness: Mr Brett [name] is a 77yo with a hx of nephrolithiasis s/p Left PCNL. He presents today for removal of residual fragments  Past Medical History:  Diagnosis Date  . Carotid stenosis   . Elevated PSA   . 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 Glomerulonephritis 1961  . Hyperglycemia   . Hyperlipidemia   . Hyperplastic colon polyp 11/24/2009   x7  . Hypertension   . Iron deficiency anemia due to chronic blood loss 08/15/2016  . Leukocytosis 08/02/2016  . 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  . PVD (peripheral vascular disease) ([place])   . 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 BYPASS GRAFT  2006  . CORONARY ARTERY BYPASS GRAFT  12/07/2004   CABG x5 Dr [name]  . CYSTOSCOPY WITH LITHOLAPAXY N/A 09/25/2017   Procedure: CYSTOSCOPY WITH LITHOLAPAXY;  Surgeon: Cleon Gustin, MD;  [facility];  Service: Urology;  Laterality: N/A;  . HOLMIUM LASER APPLICATION N/A 09/20/863   Procedure: HOLMIUM LASER APPLICATION;  Surgeon: Cleon Gustin, MD;  [facility];  Service: Urology;  Laterality: N/A;  . HOLMIUM LASER APPLICATION Left 02/12/4695   Procedure: HOLMIUM LASER APPLICATION;  Surgeon: Cleon Gustin, MD;  [facility];  Service: Urology;  Laterality: Left;  . IR URETERAL STENT LEFT NEW ACCESS W/O SEP NEPHROSTOMY CATH  10/11/2017  . NEPHROLITHOTOMY Left 10/11/2017   Procedure: NEPHROLITHOTOMY PERCUTANEOUS;  Surgeon: Cleon Gustin, MD;  [facility];  Service: Urology;  Laterality: Left;  . RIGHT HEART CATH AND CORONARY/GRAFT ANGIOGRAPHY N/A 11/21/2016   Procedure: Right Heart  Cath and Coronary/Graft Angiography;  Surgeon: Sherren Mocha, MD;  Location: [facility];  Service: Cardiovascular;  Laterality: N/A;  . TEE WITHOUT CARDIOVERSION N/A 12/05/2016   Procedure: TRANSESOPHAGEAL ECHOCARDIOGRAM (TEE);  Surgeon: Sherren Mocha, MD;  Location: [facility];  Service: Open Heart Surgery;  Laterality: N/A;  . TRANSCATHETER AORTIC VALVE REPLACEMENT, TRANSFEMORAL N/A 12/05/2016   Procedure: TRANSCATHETER AORTIC VALVE REPLACEMENT, TRANSFEMORAL;  Surgeon: Sherren Mocha, MD;  Location: [facility];  Service: Open Heart Surgery;  Laterality: N/A;    Home Medications:  Current Facility-Administered Medications  Medication Dose Route Frequency Provider Last Rate Last Dose  . cefTRIAXone (ROCEPHIN) 2 g in sodium chloride 0.9 % 100 mL IVPB  2 g Intravenous 30 min Pre-Op Tymesha Ditmore, Candee Furbish, MD      . lactated ringers infusion   Intravenous Continuous Barnet Glasgow, MD 20 mL/hr at 11/02/17 1137     Allergies:  Allergies  Allergen Reactions  . Levaquin [Levofloxacin In D5w]     Family History  Problem Relation Age of Onset  . Heart attack Father   . Dementia Mother   . Colon cancer Neg Hx   . Colon polyps Neg Hx   . Rectal cancer Neg Hx   . Stomach cancer Neg Hx    Social History:  reports that he quit smoking about 22 months ago. His smoking use included cigarettes. He has a 40.00 pack-year smoking history. He has never used smokeless tobacco. He reports that he does not drink alcohol or use drugs.  Review  of Systems  Gastrointestinal: Positive for abdominal pain.  Genitourinary: Positive for frequency, hematuria and urgency.  All other systems reviewed and are negative.   Physical Exam:  Vital signs in last 24 hours: Temp:  [98.2 F (36.8 C)] 98.2 F (36.8 C) (03/29 1116) Pulse Rate:  [84] 84 (03/29 1116) Resp:  [16] 16 (03/29 1116) BP: (134)/(84) 134/84 (03/29 1116) SpO2:  [97 %] 97 % (03/29 1116) Weight:  [65.8 kg (145 lb)] 65.8 kg (145 lb) (03/29  1129) Physical Exam  Constitutional: He is oriented to person, place, and time. He appears well-developed and well-nourished.  HENT:  Head: Normocephalic and atraumatic.  Eyes: Pupils are equal, round, and reactive to light. EOM are normal.  Neck: Normal range of motion. No thyromegaly present.  Cardiovascular: Normal rate and regular rhythm.  Respiratory: Effort normal. No respiratory distress.  GI: Soft. He exhibits no distension.  Musculoskeletal: Normal range of motion. He exhibits no edema.  Neurological: He is alert and oriented to person, place, and time.  Skin: Skin is warm and dry.  Psychiatric: He has a normal mood and affect. His behavior is normal. Judgment and thought content normal.    Laboratory Data:  No results found for this or any previous visit (from the past 24 hour(s)). Recent Results (from the past 240 hour(s))  Urine culture     Status: Abnormal   Collection Time: 10/26/17  9:49 PM  Result Value Ref Range Status   Specimen Description   Final    URINE, RANDOM Performed at [facility] [address] [place], [place] [address]    Special Requests   Final    NONE Performed at [facility], [place] [address], [place], [place] [address]    Culture (A)  Final    50,000 COLONIES/mL STAPHYLOCOCCUS SPECIES (COAGULASE NEGATIVE)   Report Status 10/29/2017 FINAL  Final   Organism ID, Bacteria STAPHYLOCOCCUS SPECIES (COAGULASE NEGATIVE) (A)  Final      Susceptibility   Staphylococcus species (coagulase negative) - MIC*    CIPROFLOXACIN <=0.5 SENSITIVE Sensitive     GENTAMICIN <=0.5 SENSITIVE Sensitive     NITROFURANTOIN <=16 SENSITIVE Sensitive     OXACILLIN >=4 RESISTANT Resistant     TETRACYCLINE 2 SENSITIVE Sensitive     VANCOMYCIN 2 SENSITIVE Sensitive     TRIMETH/SULFA <=10 SENSITIVE Sensitive     CLINDAMYCIN <=0.25 SENSITIVE Sensitive     RIFAMPIN <=0.5 SENSITIVE Sensitive     Inducible Clindamycin NEGATIVE Sensitive      * 50,000 COLONIES/mL STAPHYLOCOCCUS SPECIES (COAGULASE NEGATIVE)  Urine culture     Status: Abnormal (Preliminary result)   Collection Time: 11/01/17  8:14 AM  Result Value Ref Range Status   Specimen Description   Final    URINE, RANDOM PEDIATRIC BAG Performed at [facility] [address] [place], [place] [address]    Special Requests   Final    NONE Performed at [facility], [place] [address] [place], [place] [address]    Culture (A)  Final    >=100,000 COLONIES/mL STAPHYLOCOCCUS SPECIES (COAGULASE NEGATIVE)   Report Status PENDING  Incomplete   Creatinine: Recent Labs    11/01/17 0717  CREATININE 1.57*   Baseline Creatinine: 1.5  Impression/Assessment:  76yo with left renal calculi  Plan:  The risks/benefits/alternatives to left ureteroscopic stone extraction was explained to the patient and he understands and wishes to proceed with surgery  Nicolette Bang 11/02/2017, 12:32 PM

## 2017-11-02 NOTE — Transfer of Care (Signed)
Immediate Anesthesia Transfer of Care Note  Patient: Brett [name]  Procedure(s) Performed: CYSTOSCOPY WITH RETROGRADE PYELOGRAM, LEFT URETEROSCOPY, LEFT STENT EXCHANGE (Left ) HOLMIUM LASER APPLICATION (Left )  Patient Location: PACU  Anesthesia Type:General  Level of Consciousness: awake, alert  and oriented  Airway & Oxygen Therapy: Patient Spontanous Breathing and Patient connected to face mask oxygen  Post-op Assessment: Report given to RN and Post -op Vital signs reviewed and stable  Post vital signs: Reviewed and stable  Last Vitals:  Vitals Value Taken Time  BP    Temp    Pulse    Resp 11 11/02/2017  2:12 PM  SpO2    Vitals shown include unvalidated device data.  Last Pain:  Vitals:   11/02/17 1116  TempSrc: Oral      Patients Stated Pain Goal: 4 (52/08/02 2336)  Complications: No apparent anesthesia complications

## 2017-11-02 NOTE — Op Note (Signed)
.  Preoperative diagnosis: Left ureteral stone  Postoperative diagnosis: Same  Procedure: 1 cystoscopy 2. Left retrograde pyelography 3.  Intraoperative fluoroscopy, under one hour, with interpretation 4.  Left ureteroscopic stone manipulation with laser lithotripsy 5.  Left 6 x 26 JJ stent exchange  Attending: Rosie Fate  Anesthesia: General  Estimated blood loss: None  Drains: Left 6 x 26 JJ ureteral stent with tether  Specimens: stone for analysis  Antibiotics: rocephin  Findings: left lower and upper pole calculi. No hydronephrosis. No masses/lesions in the bladder. Ureteral orifices in normal anatomic location.  Indications: Patient is a 77 year old male with a history of left renal stone who underwent PCNL and has residual fragments. .  After discussing treatment options, he decided proceed with left ureteroscopic stone manipulation.  Procedure her in detail: The patient was brought to the operating room and a brief timeout was done to ensure correct patient, correct procedure, correct site.  General anesthesia was administered patient was placed in dorsal lithotomy position.  Her genitalia was then prepped and draped in usual sterile fashion.  A rigid 6 French cystoscope was passed in the urethra and the bladder.  Bladder was inspected free masses or lesions.  the ureteral orifices were in the normal orthotopic locations.Using a grasper the l;eft ureteral stent was brought to the urethral meatus. A zipwire was then advanced through the stent and up to the renal pelvis. The stent was then removed.  a 6 french ureteral catheter was then instilled into the left ureteral orifice.  a gentle retrograde was obtained and findings noted above.    we then removed the cystoscope and cannulated the left ureteral orifice with a semirigid ureteroscope.  No stone was found in the ureter. Once we reached the UPJ a sensor wire was advanced in to the renal pelvis. We then removed the  ureteroscope and advanced am 12/14 x 35cm access sheath up to the renal pelvis. We then used the flexible ureteroscope to perform nephroscopy. We encountered the stone in the lower pole and upper poles. Using a 200nm laser fiber the stones were fragments.    the pieces were then removed with a Ngage basket.    once all stone fragments were removed we then removed the access sheath under direct vision and noted no injury to the ureter. We then placed a 6 x 26 double-j ureteral stent over the original zip wire.  We then removed the wire and good coil was noted in the the renal pelvis under fluoroscopy and the bladder under direct vision. the bladder was then drained and this concluded the procedure which was well tolerated by patient.  Complications: None  Condition: Stable, extubated, transferred to PACU  Plan: Patient is to be discharged home as to follow-up in one week. He is to remove his stent by pulling the tether in 72 hours

## 2017-11-02 NOTE — Anesthesia Procedure Notes (Signed)
Procedure Name: LMA Insertion Date/Time: 11/02/2017 1:05 PM Performed by: Sharlette Dense, CRNA Patient Re-evaluated:Patient Re-evaluated prior to induction Oxygen Delivery Method: Circle system utilized Preoxygenation: Pre-oxygenation with 100% oxygen Induction Type: IV induction Ventilation: Mask ventilation without difficulty LMA: LMA inserted LMA Size: 4.0 Number of attempts: 1 Placement Confirmation: positive ETCO2 and breath sounds checked- equal and bilateral Tube secured with: Tape Dental Injury: Teeth and Oropharynx as per pre-operative assessment

## 2017-11-02 NOTE — Discharge Instructions (Signed)

## 2017-11-03 LAB — URINE CULTURE

## 2017-11-04 ENCOUNTER — Telehealth: Payer: Self-pay

## 2017-11-04 NOTE — Telephone Encounter (Signed)
Post ED Visit - Positive Culture Follow-up: Successful Patient Follow-Up  Culture assessed and recommendations reviewed by: []  Elenor Quinones, Pharm.D. []  Heide Guile, Pharm.D., BCPS AQ-ID []  Parks Neptune, Pharm.D., BCPS []  Alycia Rossetti, Pharm.D., BCPS []  [place], Pharm.D., BCPS, AAHIVP []  Legrand Como, Pharm.D., BCPS, AAHIVP []  Salome Arnt, PharmD, BCPS []  Dimitri Ped, PharmD, BCPS []  Vincenza Hews, PharmD, BCPS [facility]  Positive urine culture  []  Patient discharged without antimicrobial prescription and treatment is now indicated [x]  Organism is resistant to prescribed ED discharge antimicrobial []  Patient with positive blood cultures  Changes discussed with ED provider: Oleta Mouse [facility]  New antibiotic prescription Bactrim DS 1 BID x 7 days Called to [facility] [phone]  Contacted patient, date 11/04/17, time 1106   Brett [name], Carolynn Comment 11/04/2017, 11:05 AM

## 2017-11-04 NOTE — Progress Notes (Signed)
ED Antimicrobial Stewardship Positive Culture Follow Up   Brett [name] is an 76 y.o. male who presented to [facility] on 11/01/2017 s/p left percutaneous nephrolithotomy. Presented for uretral stent exchange and removal of stones Chief Complaint  Patient presents with  . Urinary Retention    Recent Results (from the past 720 hour(s))  Urine culture     Status: Abnormal   Collection Time: 10/26/17  9:49 PM  Result Value Ref Range Status   Specimen Description   Final    URINE, RANDOM Performed at [facility] [address], [place], [place] [address]    Special Requests   Final    NONE Performed at [facility], [place] [address], [place], [place] [address]    Culture (A)  Final    50,000 COLONIES/mL STAPHYLOCOCCUS SPECIES (COAGULASE NEGATIVE)   Report Status 10/29/2017 FINAL  Final   Organism ID, Bacteria STAPHYLOCOCCUS SPECIES (COAGULASE NEGATIVE) (A)  Final      Susceptibility   Staphylococcus species (coagulase negative) - MIC*    CIPROFLOXACIN <=0.5 SENSITIVE Sensitive     GENTAMICIN <=0.5 SENSITIVE Sensitive     NITROFURANTOIN <=16 SENSITIVE Sensitive     OXACILLIN >=4 RESISTANT Resistant     TETRACYCLINE 2 SENSITIVE Sensitive     VANCOMYCIN 2 SENSITIVE Sensitive     TRIMETH/SULFA <=10 SENSITIVE Sensitive     CLINDAMYCIN <=0.25 SENSITIVE Sensitive     RIFAMPIN <=0.5 SENSITIVE Sensitive     Inducible Clindamycin NEGATIVE Sensitive     * 50,000 COLONIES/mL STAPHYLOCOCCUS SPECIES (COAGULASE NEGATIVE)  Urine culture     Status: Abnormal   Collection Time: 11/01/17  8:14 AM  Result Value Ref Range Status   Specimen Description   Final    URINE, RANDOM PEDIATRIC BAG Performed at [facility] [address], [place], [place] [address]    Special Requests   Final    NONE Performed at [facility], [place] [address] [place], [place] [address]    Culture >=100,000 COLONIES/mL STAPHYLOCOCCUS  EPIDERMIDIS (A)  Final   Report Status 11/03/2017 FINAL  Final   Organism ID, Bacteria STAPHYLOCOCCUS EPIDERMIDIS (A)  Final      Susceptibility   Staphylococcus epidermidis - MIC*    CIPROFLOXACIN <=0.5 SENSITIVE Sensitive     GENTAMICIN <=0.5 SENSITIVE Sensitive     NITROFURANTOIN <=16 SENSITIVE Sensitive     OXACILLIN >=4 RESISTANT Resistant     TETRACYCLINE 2 SENSITIVE Sensitive     VANCOMYCIN 2 SENSITIVE Sensitive     TRIMETH/SULFA <=10 SENSITIVE Sensitive     CLINDAMYCIN <=0.25 SENSITIVE Sensitive     RIFAMPIN <=0.5 SENSITIVE Sensitive     Inducible Clindamycin NEGATIVE Sensitive     * >=100,000 COLONIES/mL STAPHYLOCOCCUS EPIDERMIDIS    [x]  Treated with cephalexin, organism resistant to prescribed antimicrobial []  Patient discharged originally without antimicrobial agent and treatment is now indicated  New antibiotic prescription: Stop cephalexin. Start Bactrim DS 1 tablet twice daily x 7 days   ED Provider: Franchot Heidelberg, PA-C  Albertina Parr, PharmD., BCPS Clinical Pharmacist Clinical phone for 11/04/17 until 3:30pm: [phone] If after 3:30pm, please call main pharmacy at: [phone]

## 2017-11-05 ENCOUNTER — Encounter (HOSPITAL_COMMUNITY): Payer: Self-pay | Admitting: Urology

## 2017-11-05 NOTE — Anesthesia Postprocedure Evaluation (Signed)
Anesthesia Post Note  Patient: Brett [name]  Procedure(s) Performed: CYSTOSCOPY WITH RETROGRADE PYELOGRAM, LEFT URETEROSCOPY, LEFT STENT EXCHANGE (Left ) HOLMIUM LASER APPLICATION (Left )     Patient location during evaluation: PACU Anesthesia Type: General Level of consciousness: awake and alert Pain management: pain level controlled Vital Signs Assessment: post-procedure vital signs reviewed and stable Respiratory status: spontaneous breathing, nonlabored ventilation, respiratory function stable and patient connected to nasal cannula oxygen Cardiovascular status: blood pressure returned to baseline and stable Postop Assessment: no apparent nausea or vomiting Anesthetic complications: no    Last Vitals:  Vitals:   11/02/17 1517 11/02/17 1603  BP: (!) 112/92 (!) 132/54  Pulse: 81 86  Resp: 18 16  Temp:  36.7 C  SpO2: 99% 99%    Last Pain:  Vitals:   11/05/17 1006  TempSrc:   PainSc: 0-No pain                 Barnet Glasgow

## 2017-11-06 DIAGNOSIS — R3915 Urgency of urination: Secondary | ICD-10-CM | POA: Diagnosis not present

## 2017-11-06 DIAGNOSIS — N401 Enlarged prostate with lower urinary tract symptoms: Secondary | ICD-10-CM | POA: Diagnosis not present

## 2017-11-06 LAB — PSA: PSA: 2.38

## 2017-11-07 ENCOUNTER — Telehealth: Payer: Self-pay | Admitting: Cardiovascular Disease

## 2017-11-07 ENCOUNTER — Other Ambulatory Visit: Payer: Self-pay

## 2017-11-07 MED ORDER — AMOXICILLIN 500 MG PO TABS: 2000.0000 mg | ORAL_TABLET | ORAL | 6 refills | Status: DC

## 2017-11-07 NOTE — Telephone Encounter (Signed)
Patient's wife called to report worsening SOB, weakness and belching for about 1 month. He also has not been sleeping well. She said she got very worried when walking to the mailbox earlier today and decided to call the office for assistance. She said he got so SOB they had to stop and rest. He felt better after that. She denies swelling- she states he has actually lost 9 pounds in 3 weeks. She states he really has no appetite. Scheduled patient next Tuesday for evaluation. He will try Tums for the belching. If SOB gets worse, he will call back prior to appointment.

## 2017-11-07 NOTE — Telephone Encounter (Signed)
New Message   Pt's wife is calling, states that the pt is burping a lot and she is concerned it has to do with his heart. Please call

## 2017-11-08 ENCOUNTER — Encounter: Payer: Self-pay | Admitting: Family Medicine

## 2017-11-13 ENCOUNTER — Ambulatory Visit: Payer: Medicare Other | Admitting: Physician Assistant

## 2017-11-21 ENCOUNTER — Ambulatory Visit (HOSPITAL_COMMUNITY)
Admission: RE | Admit: 2017-11-21 | Discharge: 2017-11-21 | Disposition: A | Discharge status: Home / Self Care | Payer: Medicare Other | Source: Ambulatory Visit | Attending: Cardiology | Admitting: Cardiology

## 2017-11-21 DIAGNOSIS — I2510 Atherosclerotic heart disease of native coronary artery without angina pectoris: Secondary | ICD-10-CM | POA: Insufficient documentation

## 2017-11-21 DIAGNOSIS — E785 Hyperlipidemia, unspecified: Secondary | ICD-10-CM | POA: Insufficient documentation

## 2017-11-21 DIAGNOSIS — I739 Peripheral vascular disease, unspecified: Secondary | ICD-10-CM | POA: Insufficient documentation

## 2017-11-21 DIAGNOSIS — Z87891 Personal history of nicotine dependence: Secondary | ICD-10-CM | POA: Insufficient documentation

## 2017-11-21 DIAGNOSIS — I10 Essential (primary) hypertension: Secondary | ICD-10-CM | POA: Diagnosis not present

## 2017-11-21 DIAGNOSIS — I6523 Occlusion and stenosis of bilateral carotid arteries: Secondary | ICD-10-CM | POA: Diagnosis not present

## 2017-11-21 DIAGNOSIS — I6529 Occlusion and stenosis of unspecified carotid artery: Secondary | ICD-10-CM

## 2017-11-30 ENCOUNTER — Telehealth: Payer: Self-pay

## 2017-11-30 DIAGNOSIS — I6523 Occlusion and stenosis of bilateral carotid arteries: Secondary | ICD-10-CM

## 2017-11-30 NOTE — Telephone Encounter (Signed)
Informed patient of results and verbal understanding expressed.  Repeat carotids ordered to be scheduled in 1 year. Patient agrees with treatment plan. 

## 2017-11-30 NOTE — Telephone Encounter (Signed)
-----   Message from Sherren Mocha, MD sent at 11/30/2017 10:53 AM EDT ----- Findings noted - <40% stenosis bilaterally. 12 month FU recommended.

## 2017-12-18 DIAGNOSIS — N200 Calculus of kidney: Secondary | ICD-10-CM | POA: Diagnosis not present

## 2017-12-19 ENCOUNTER — Encounter: Payer: Self-pay | Admitting: Family Medicine

## 2018-01-14 ENCOUNTER — Other Ambulatory Visit: Payer: Self-pay | Admitting: Family Medicine

## 2018-01-25 ENCOUNTER — Other Ambulatory Visit: Payer: Self-pay

## 2018-01-25 ENCOUNTER — Ambulatory Visit (INDEPENDENT_AMBULATORY_CARE_PROVIDER_SITE_OTHER): Payer: Medicare Other | Admitting: Cardiovascular Disease

## 2018-01-25 ENCOUNTER — Ambulatory Visit (HOSPITAL_COMMUNITY): Payer: Medicare Other | Attending: Cardiovascular Disease

## 2018-01-25 ENCOUNTER — Encounter: Payer: Self-pay | Admitting: Cardiovascular Disease

## 2018-01-25 VITALS — BP 148/68 | HR 70 | Ht 66.0 in | Wt 147.0 lb

## 2018-01-25 DIAGNOSIS — Z951 Presence of aortocoronary bypass graft: Secondary | ICD-10-CM | POA: Diagnosis not present

## 2018-01-25 DIAGNOSIS — I359 Nonrheumatic aortic valve disorder, unspecified: Secondary | ICD-10-CM

## 2018-01-25 DIAGNOSIS — I6523 Occlusion and stenosis of bilateral carotid arteries: Secondary | ICD-10-CM

## 2018-01-25 DIAGNOSIS — E785 Hyperlipidemia, unspecified: Secondary | ICD-10-CM | POA: Insufficient documentation

## 2018-01-25 DIAGNOSIS — Z87891 Personal history of nicotine dependence: Secondary | ICD-10-CM | POA: Insufficient documentation

## 2018-01-25 DIAGNOSIS — Z953 Presence of xenogenic heart valve: Secondary | ICD-10-CM | POA: Insufficient documentation

## 2018-01-25 DIAGNOSIS — Z8249 Family history of ischemic heart disease and other diseases of the circulatory system: Secondary | ICD-10-CM | POA: Insufficient documentation

## 2018-01-25 DIAGNOSIS — I119 Hypertensive heart disease without heart failure: Secondary | ICD-10-CM | POA: Insufficient documentation

## 2018-01-25 DIAGNOSIS — I340 Nonrheumatic mitral (valve) insufficiency: Secondary | ICD-10-CM | POA: Insufficient documentation

## 2018-01-25 DIAGNOSIS — I2510 Atherosclerotic heart disease of native coronary artery without angina pectoris: Secondary | ICD-10-CM | POA: Diagnosis not present

## 2018-01-25 DIAGNOSIS — I6529 Occlusion and stenosis of unspecified carotid artery: Secondary | ICD-10-CM | POA: Diagnosis not present

## 2018-01-25 NOTE — Patient Instructions (Signed)
Medication Instructions:  Your physician recommends that you continue on your current medications as directed. Please refer to the Current Medication list given to you today.  Labwork: None  Testing/Procedures: None  Follow-Up: Your physician recommends that you schedule a follow-up appointment in: 6 months with Dr. Burt Knack.    Any Other Special Instructions Will Be Listed Below (If Applicable).     If you need a refill on your cardiac medications before your next appointment, please call your pharmacy.

## 2018-01-25 NOTE — Progress Notes (Signed)
Cardiology Office Note Date:  01/25/2018   ID:  Brett [name], DOB [birth year], MRN [id number]  PCP:  Marin Olp, MD  Cardiologist:  Sherren Mocha, MD    Chief Complaint  Patient presents with  . Follow-up    aortic valve disease     History of Present Illness: Brett [name] is a 77 y.o. male who presents for follow-up evaluation.  He is here with his wife today.  He has had a rough time lately with kidney stones requiring multiple urologic procedures.  He is now recovered from all this.  He has not had any cardiovascular symptoms recently.  He specifically denies chest pain, shortness of breath, leg swelling, or heart palpitations.  He is been compliant with his medications.  He does raise concern about memory loss.  States that he is developed some problems with short-term memory and his wife has been concerned about this.  He inquires about use of Aricept.   Past Medical History:  Diagnosis Date  . Carotid stenosis   . Elevated PSA   . 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 Glomerulonephritis 1961  . Hyperglycemia   . Hyperlipidemia   . Hyperplastic colon polyp 11/24/2009   x7  . Hypertension   . Iron deficiency anemia due to chronic blood loss 08/15/2016  . Leukocytosis 08/02/2016  . 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  . PVD (peripheral vascular disease) ([place])   . 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 BYPASS GRAFT  2006  . CORONARY ARTERY BYPASS GRAFT  12/07/2004   CABG x5 Dr [name]  . CYSTOSCOPY WITH LITHOLAPAXY N/A 09/25/2017   Procedure: CYSTOSCOPY WITH LITHOLAPAXY;  Surgeon: Cleon Gustin, MD;  Location: [facility];  Service: Urology;   Laterality: N/A;  . CYSTOSCOPY WITH RETROGRADE PYELOGRAM, URETEROSCOPY AND STENT PLACEMENT Left 11/02/2017   Procedure: CYSTOSCOPY WITH RETROGRADE PYELOGRAM, LEFT URETEROSCOPY, LEFT STENT EXCHANGE;  Surgeon: Cleon Gustin, MD;  Location: [facility];  Service: Urology;  Laterality: Left;  . HOLMIUM LASER APPLICATION N/A 4/68/0321   Procedure: HOLMIUM LASER APPLICATION;  Surgeon: Cleon Gustin, MD;  Location: [facility];  Service: Urology;  Laterality: N/A;  . HOLMIUM LASER APPLICATION Left 09/09/4823   Procedure: HOLMIUM LASER APPLICATION;  Surgeon: Cleon Gustin, MD;  Location: [facility];  Service: Urology;  Laterality: Left;  . HOLMIUM LASER APPLICATION Left 0/10/7046   Procedure: HOLMIUM LASER APPLICATION;  Surgeon: Cleon Gustin, MD;  Location: [facility];  Service: Urology;  Laterality: Left;  . IR URETERAL STENT LEFT NEW ACCESS W/O SEP NEPHROSTOMY CATH  10/11/2017  . NEPHROLITHOTOMY Left 10/11/2017   Procedure: NEPHROLITHOTOMY PERCUTANEOUS;  Surgeon: Cleon Gustin, MD;  Location: [facility];  Service: Urology;  Laterality: Left;  . RIGHT HEART CATH AND CORONARY/GRAFT ANGIOGRAPHY N/A 11/21/2016   Procedure: Right Heart Cath and Coronary/Graft Angiography;  Surgeon: Sherren Mocha, MD;  Location: [place] [facility];  Service: Cardiovascular;  Laterality: N/A;  . TEE WITHOUT CARDIOVERSION N/A 12/05/2016   Procedure: TRANSESOPHAGEAL ECHOCARDIOGRAM (TEE);  Surgeon: Sherren Mocha, MD;  Location: [facility];  Service: Open Heart Surgery;  Laterality: N/A;  . TRANSCATHETER AORTIC VALVE REPLACEMENT, TRANSFEMORAL N/A 12/05/2016  Procedure: TRANSCATHETER AORTIC VALVE REPLACEMENT, TRANSFEMORAL;  Surgeon: Sherren Mocha, MD;  Location: [place];  Service: Open Heart Surgery;  Laterality: N/A;    Current Outpatient Medications  Medication Sig Dispense Refill  . acetaminophen (TYLENOL) 500 MG tablet Take 500 mg by mouth every 6 (six) hours as needed for moderate pain or headache.     Marland Kitchen acidophilus (RISAQUAD)  CAPS capsule Take 1 capsule by mouth daily.    Marland Kitchen alfuzosin (UROXATRAL) 10 MG 24 hr tablet Take 10 mg by mouth every evening.     Marland Kitchen amLODipine (NORVASC) 5 MG tablet Take 1 tablet (5 mg total) by mouth daily. 90 tablet 3  . amoxicillin (AMOXIL) 500 MG tablet Take 4 tablets (2,000 mg total) by mouth as directed. Take one hour prior to dental procedures. 4 tablet 6  . aspirin EC 81 MG EC tablet Take 1 tablet (81 mg total) by mouth daily.    Marland Kitchen atorvastatin (LIPITOR) 40 MG tablet TAKE 1 TABLET DAILY (Patient taking differently: Take 40 mg by mouth daily) 90 tablet 1  . finasteride (PROSCAR) 5 MG tablet Take 5 mg by mouth daily.  3  . Inositol Niacinate (NIACIN FLUSH FREE) 500 MG CAPS Take 1 capsule by mouth daily.    . metoprolol tartrate (LOPRESSOR) 25 MG tablet Take 1 tablet (25 mg total) by mouth 2 (two) times daily. 180 tablet 3  . niacin (NIASPAN) 1000 MG CR tablet TAKE 1 TABLET AT BEDTIME 90 tablet 3  . oxyCODONE-acetaminophen (PERCOCET/ROXICET) 5-325 MG tablet Take 1-2 tablets by mouth every 4 (four) hours as needed for moderate pain. 30 tablet 0   No current facility-administered medications for this visit.     Allergies:   Levaquin [levofloxacin in d5w]   Social History:  The patient  reports that he quit smoking about 2 years ago. His smoking use included cigarettes. He has a 40.00 pack-year smoking history. He has never used smokeless tobacco. He reports that he does not drink alcohol or use drugs.   Family History:  The patient's family history includes Dementia in his mother; Heart attack in his father.    ROS:  Please see the history of present illness.  Otherwise, review of systems is positive for hearing loss, memory loss.  All other systems are reviewed and negative.    PHYSICAL EXAM: VS:  BP (!) 148/68   Pulse 70   Ht 5\' 6"  (1.676 m)   Wt 147 lb (66.7 kg)   SpO2 99%   BMI 23.73 kg/m  , BMI Body mass index is 23.73 kg/m. GEN: Well nourished, well developed, in no acute  distress  HEENT: normal  Neck: no JVD, no masses. Right carotid bruit Cardiac: RRR without murmur or gallop     Respiratory:  clear to auscultation bilaterally, normal work of breathing GI: soft, nontender, nondistended, + BS MS: no deformity or atrophy  Ext: no pretibial edema, pedal pulses 2+= bilaterally Skin: warm and dry, no rash Neuro:  Strength and sensation are intact Psych: euthymic mood, full affect  EKG:  EKG is not ordered today.  Recent Labs: 07/20/2017: ALT 14 11/01/2017: BUN 21; Creatinine, Ser 1.57; Hemoglobin 9.3; Platelets 322; Potassium 3.8; Sodium 139   Lipid Panel     Component Value Date/Time   CHOL 128 03/17/2016 0756   TRIG 80.0 03/17/2016 0756   TRIG 93 06/25/2006 0901   HDL 52.20 03/17/2016 0756   CHOLHDL 2 03/17/2016 0756   VLDL 16.0 03/17/2016 0756   LDLCALC 60  03/17/2016 0756   LDLDIRECT 65.0 07/20/2017 1058      Wt Readings from Last 3 Encounters:  01/25/18 147 lb (66.7 kg)  11/02/17 145 lb (65.8 kg)  11/01/17 145 lb (65.8 kg)     Cardiac Studies Reviewed: Today's echo is reviewed.  The formal interpretation is pending.  ASSESSMENT AND PLAN: 1.  CAD, native vessel, without angina: The patient continues to do very well on his medical program.  He is taking aspirin and a high intensity statin drug as well as a beta-blocker.  His bypass grafts were patent when he underwent cardiac catheterization as part of his preoperative evaluation for TAVR.  2.  Aortic valve disease status post TAVR: The patient's 1 year echocardiogram is reviewed today.  The formal interpretation is pending.  LV systolic function appears normal.  Transcatheter aortic valve function appears normal with a mean gradient of 8 mmHg and no paravalvular regurgitation.  3.  Hypertension: Blood pressure appears controlled on amlodipine and metoprolol.  4.  Hyperlipidemia: Most recent lipids are reviewed.  LDL is at goal.  He continues on atorvastatin and Niaspan.  5.  Memory  loss: I asked him to review his symptoms with Dr. Yong Channel for direction of treatment options.  He understands this is outside of my area of expertise.  Current medicines are reviewed with the patient today.  The patient does not have concerns regarding medicines.  Labs/ tests ordered today include:  No orders of the defined types were placed in this encounter.   Disposition:   FU 6 months  Signed, Sherren Mocha, MD  01/25/2018 9:29 AM    [facility] [place], [place], [place]  [address] Phone: [phone]; Fax: [phone]

## 2018-02-09 ENCOUNTER — Other Ambulatory Visit: Payer: Self-pay | Admitting: Cardiovascular Disease

## 2018-02-21 DIAGNOSIS — R351 Nocturia: Secondary | ICD-10-CM | POA: Diagnosis not present

## 2018-02-21 DIAGNOSIS — N401 Enlarged prostate with lower urinary tract symptoms: Secondary | ICD-10-CM | POA: Diagnosis not present

## 2018-03-14 ENCOUNTER — Other Ambulatory Visit: Payer: Self-pay | Admitting: Cardiovascular Disease

## 2018-03-20 ENCOUNTER — Encounter: Payer: Self-pay | Admitting: Thoracic Surgery (Cardiothoracic Vascular Surgery)

## 2018-04-13 DIAGNOSIS — Z23 Encounter for immunization: Secondary | ICD-10-CM | POA: Diagnosis not present

## 2018-06-13 IMAGING — CT CT CHEST LUNG CANCER SCREENING LOW DOSE W/O CM
2 of 4 series · 15 of 40 positions shown, 18 images · non-contrast
Comparison: 06/11/2014

CLINICAL DATA: Forty pack-year history. Former smoker. Asymptomatic

EXAM:
CT CHEST WITHOUT CONTRAST LOW-DOSE FOR LUNG CANCER SCREENING
TECHNIQUE: Multidetector CT imaging of the chest was performed following the
standard protocol without IV contrast.

[Series 2: thorax 5.0 i31f 3 · axial · 0.80mm/px · z∈[+1179,+1449]mm · 12 of 64 slices shown, 15 images]
[im 5/64  mediastinal]
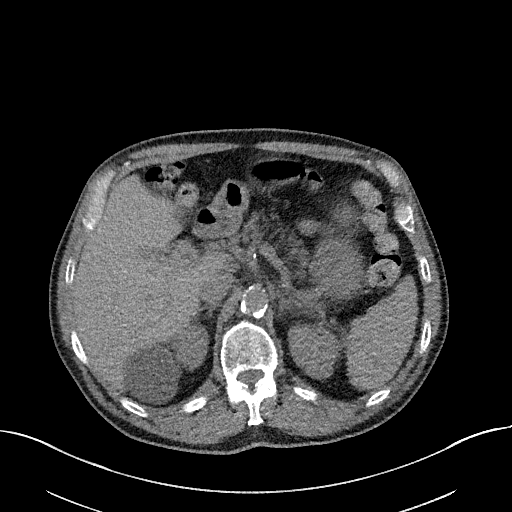
[im 5/64  lung]
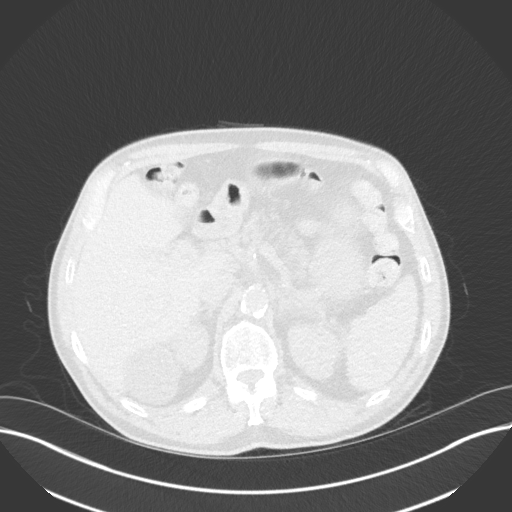
[im 10/64  lung]
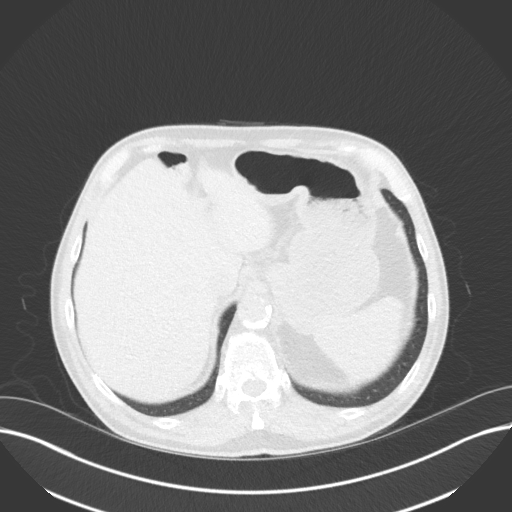
[im 15/64  lung]
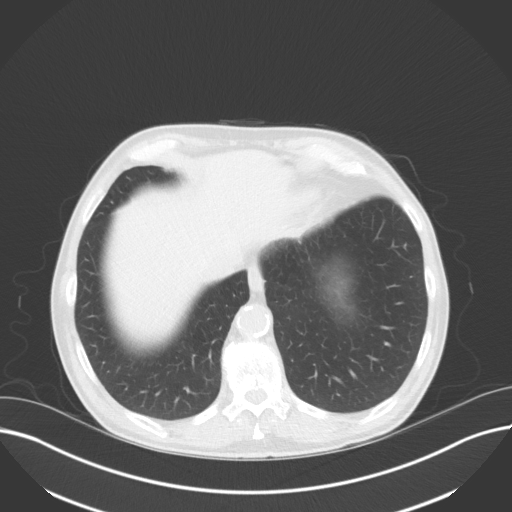
[im 20/64  lung]
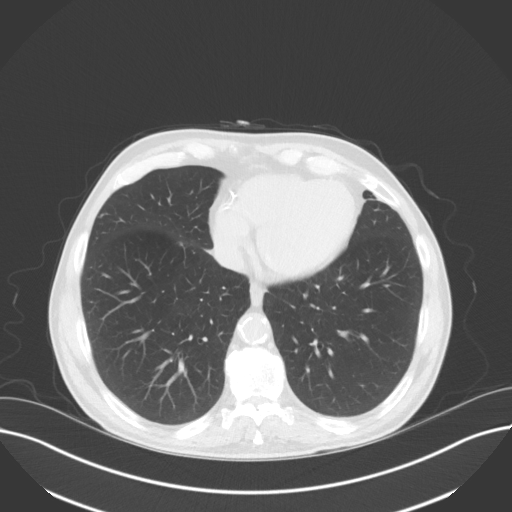
[im 25/64  mediastinal]
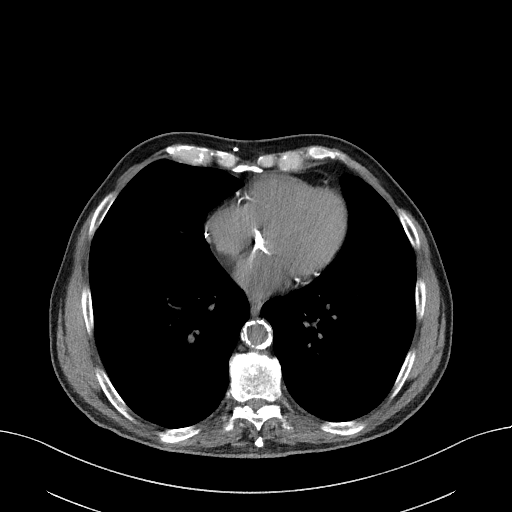
[im 25/64  lung]
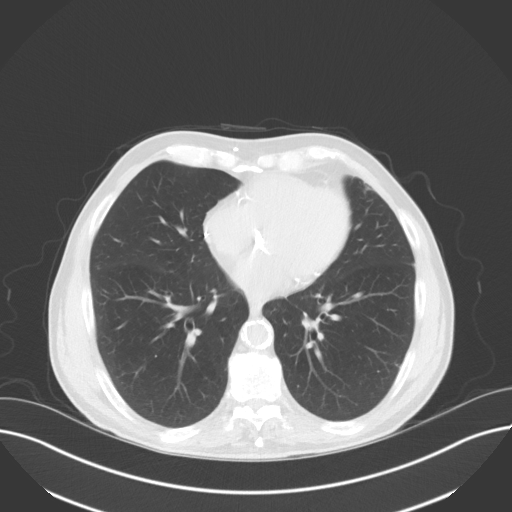
[im 30/64  lung]
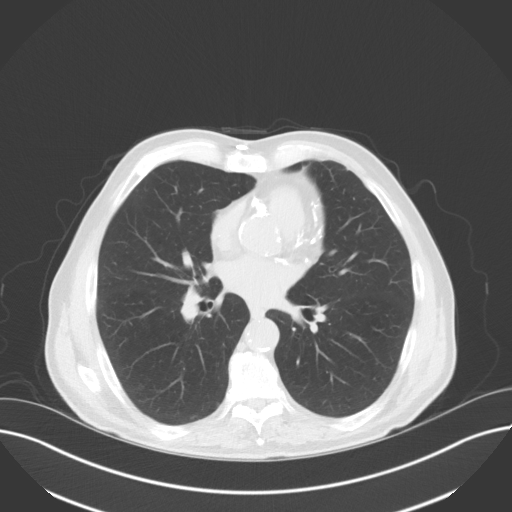
[im 34/64  lung]
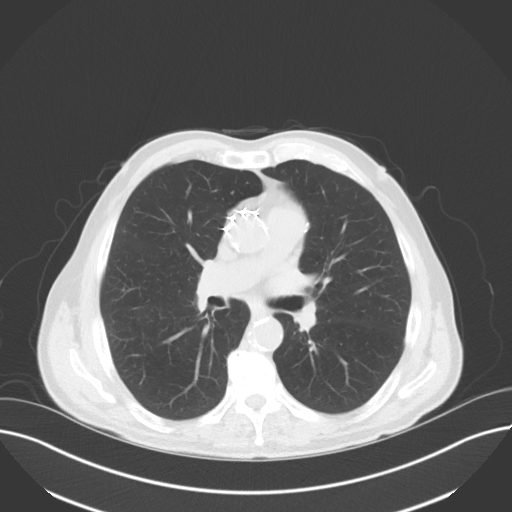
[im 39/64  lung]
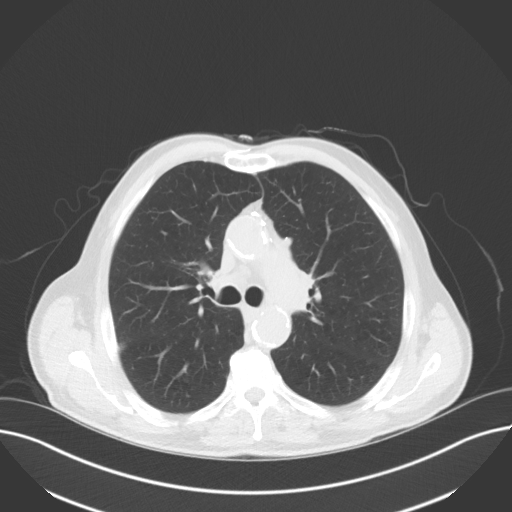
[im 44/64  mediastinal]
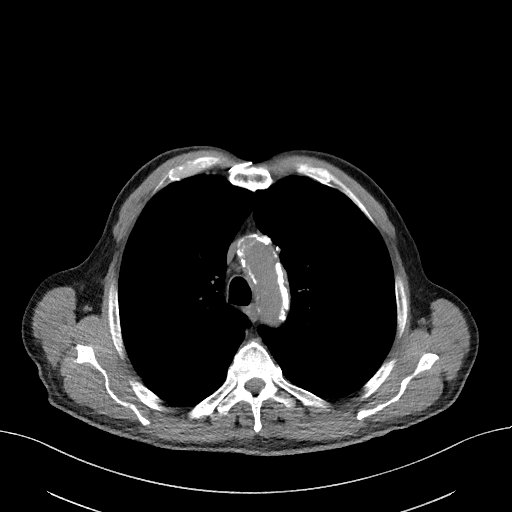
[im 44/64  lung]
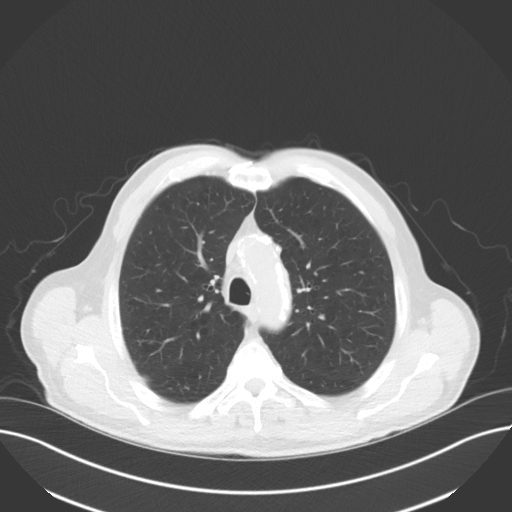
[im 49/64  lung]
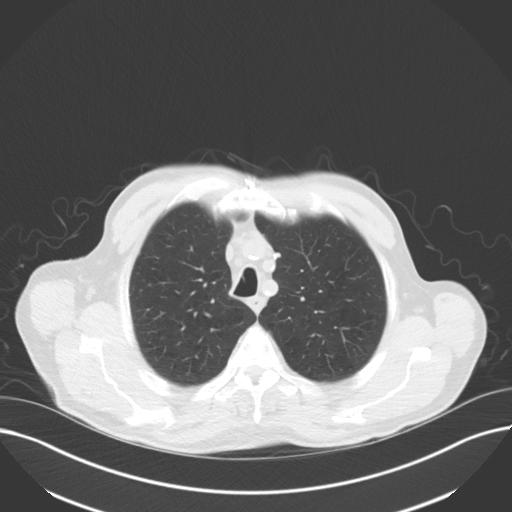
[im 54/64  lung]
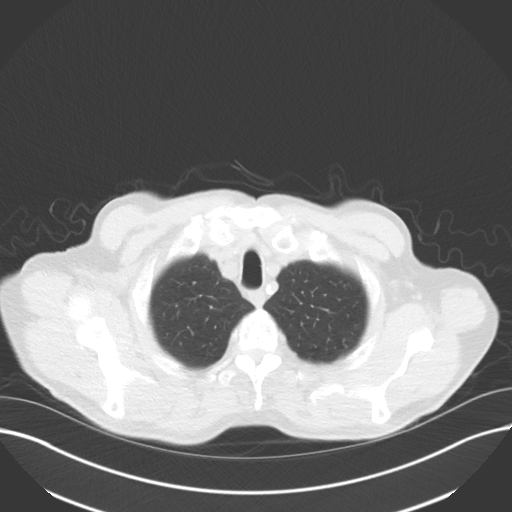
[im 59/64  lung]
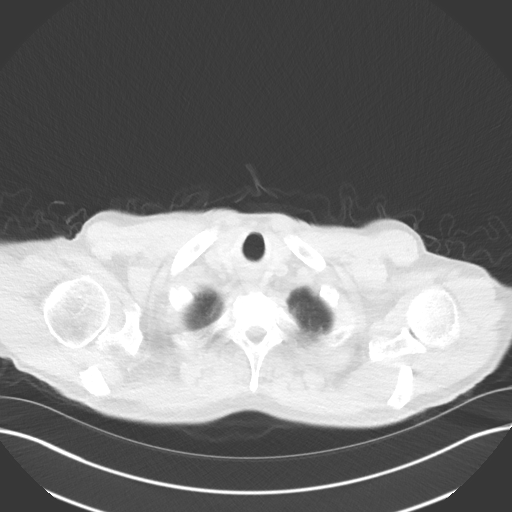

[Series 5: coronal · coronal · 0.64mm/px · 3 of 146 slices shown]
[im 30/146  lung]
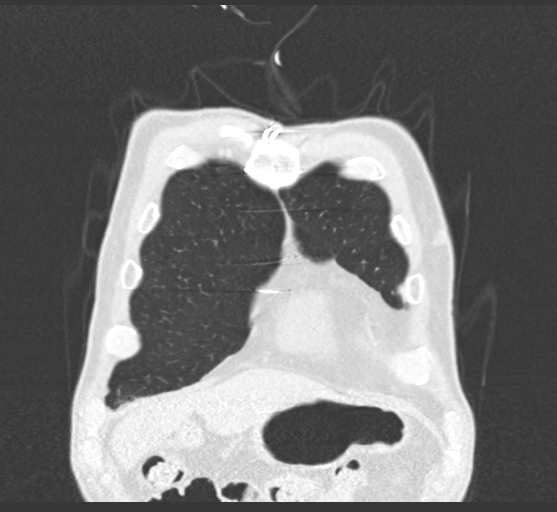
[im 59/146  lung]
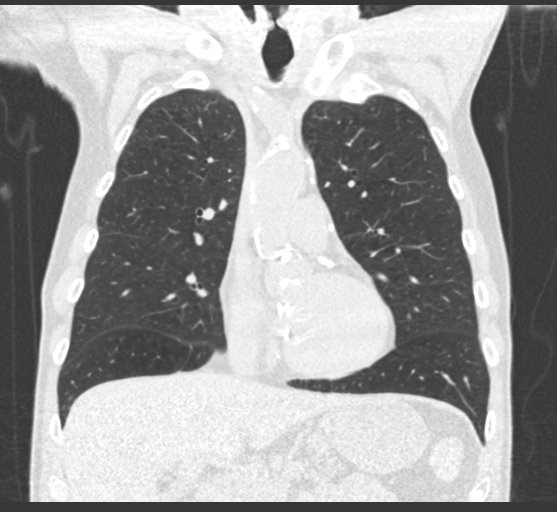
[im 88/146  lung]
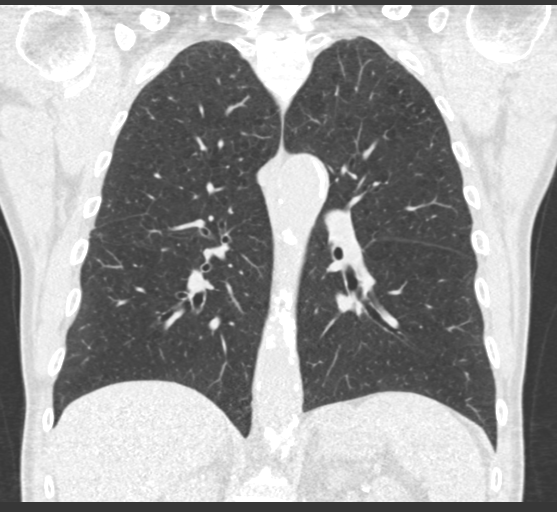

[15 of 40 positions shown; findings below may reference images not displayed]

FINDINGS: Cardiovascular: The heart size appears normal. There is aortic
atherosclerosis identified. Previous median sternotomy and CABG
procedure.

Mediastinum/Nodes: No pleural effusion identified. The trachea
appears patent and is midline. Normal appearance of the esophagus.
There is no mediastinal or hilar adenopathy identified.

Lungs/Pleura: No pleural effusion identified. No airspace
consolidation or atelectasis identified. Several of the nodules
identified on the previous exam have resolved in the interval.
Several nodules have persisted. The largest is in the right mid lung
and has an equivalent diameter of 5 mm, image 159 of series 3. No
new or enlarging pulmonary nodules identified.

Upper abdomen: Stone within the upper pole of the left kidney
measures 1.7 cm, image 63 of series 2. There is a cyst arising from
the upper pole the right kidney. This is incompletely characterized
without IV contrast. Normal appearance of the gallbladder. The
visualized portions of the spleen appear normal.

Musculoskeletal: Spondylosis is identified within the thoracic
spine. There is no aggressive lytic or sclerotic bone lesion
identified.
IMPRESSION: 1. Lung-RADS Category 2, benign appearance or behavior. Continue
annual screening with low-dose chest CT without contrast in 12
months
2. Aortic atherosclerosis and coronary artery calcification.

## 2018-07-15 ENCOUNTER — Ambulatory Visit (INDEPENDENT_AMBULATORY_CARE_PROVIDER_SITE_OTHER): Payer: Medicare Other | Admitting: Cardiovascular Disease

## 2018-07-15 ENCOUNTER — Encounter: Payer: Self-pay | Admitting: Cardiovascular Disease

## 2018-07-15 VITALS — BP 118/60 | HR 74 | Ht 66.0 in | Wt 153.8 lb

## 2018-07-15 DIAGNOSIS — I359 Nonrheumatic aortic valve disorder, unspecified: Secondary | ICD-10-CM | POA: Diagnosis not present

## 2018-07-15 DIAGNOSIS — E782 Mixed hyperlipidemia: Secondary | ICD-10-CM

## 2018-07-15 DIAGNOSIS — I2510 Atherosclerotic heart disease of native coronary artery without angina pectoris: Secondary | ICD-10-CM

## 2018-07-15 DIAGNOSIS — I6523 Occlusion and stenosis of bilateral carotid arteries: Secondary | ICD-10-CM | POA: Diagnosis not present

## 2018-07-15 DIAGNOSIS — I6529 Occlusion and stenosis of unspecified carotid artery: Secondary | ICD-10-CM

## 2018-07-15 NOTE — Progress Notes (Signed)
Cardiology Office Note:    Date:  07/15/2018   ID:  Brett [name], DOB 1940/09/02, MRN [id number]  PCP:  Brett Olp, MD  Cardiologist:  Brett Mocha, MD  Electrophysiologist:  None   Referring MD: Brett Olp, MD   Chief Complaint  Patient presents with  . Coronary Artery Disease  . Aortic Stenosis    History of Present Illness:    Brett [name] is a 77 y.o. male with a hx of coronary artery disease status post remote CABG and aortic valve disease status post TAVR in May 3235 without complication.  The patient is here with his wife today.  He is doing fairly well.  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  He has had no problems with this.  He denies chest pain, shortness of breath, leg swelling, or heart palpitations.  He has had no lightheadedness or syncope.  He has had mild memory problems and plans to review this with Dr. Yong [name] when he sees him.  Past Medical History:  Diagnosis Date  . Carotid stenosis   . Elevated PSA   . 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 Glomerulonephritis 1961  . Hyperglycemia   . Hyperlipidemia   . Hyperplastic colon polyp 11/24/2009   x7  . Hypertension   . Iron deficiency anemia due to chronic blood loss 08/15/2016  . Leukocytosis 08/02/2016  . 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  . PVD (peripheral vascular disease) ([place])   . 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 BYPASS GRAFT  2006  . CORONARY ARTERY BYPASS GRAFT  12/07/2004   CABG x5 Dr Brett [name]  . CYSTOSCOPY WITH LITHOLAPAXY N/A 09/25/2017   Procedure: CYSTOSCOPY WITH LITHOLAPAXY;  Surgeon: Brett Gustin, MD;  Location: [facility];   Service: Urology;  Laterality: N/A;  . CYSTOSCOPY WITH RETROGRADE PYELOGRAM, URETEROSCOPY AND STENT PLACEMENT Left 11/02/2017   Procedure: CYSTOSCOPY WITH RETROGRADE PYELOGRAM, LEFT URETEROSCOPY, LEFT STENT EXCHANGE;  Surgeon: Brett Gustin, MD;  Location: [facility];  Service: Urology;  Laterality: Left;  . HOLMIUM LASER APPLICATION N/A 5/73/2202   Procedure: HOLMIUM LASER APPLICATION;  Surgeon: Brett Gustin, MD;  Location: [facility];  Service: Urology;  Laterality: N/A;  . HOLMIUM LASER APPLICATION Left 12/08/2704   Procedure: HOLMIUM LASER APPLICATION;  Surgeon: Brett Gustin, MD;  Location: [facility];  Service: Urology;  Laterality: Left;  . HOLMIUM LASER APPLICATION Left 2/37/6283   Procedure: HOLMIUM LASER APPLICATION;  Surgeon: Brett Gustin, MD;  Location: [facility];  Service: Urology;  Laterality: Left;  . IR URETERAL STENT LEFT NEW ACCESS W/O SEP NEPHROSTOMY CATH  10/11/2017  . NEPHROLITHOTOMY Left 10/11/2017   Procedure: NEPHROLITHOTOMY PERCUTANEOUS;  Surgeon: Brett Gustin, MD;  Location: [facility];  Service: Urology;  Laterality: Left;  . RIGHT HEART CATH AND CORONARY/GRAFT ANGIOGRAPHY N/A 11/21/2016   Procedure: Right Heart Cath and Coronary/Graft Angiography;  Surgeon: Brett Mocha, MD;  Location: [facility];  Service: Cardiovascular;  Laterality: N/A;  . TEE WITHOUT CARDIOVERSION N/A 12/05/2016   Procedure: TRANSESOPHAGEAL ECHOCARDIOGRAM (TEE);  Surgeon: Brett Mocha, MD;  Location: [facility];  Service: Open Heart Surgery;  Laterality:  N/A;  . TRANSCATHETER AORTIC VALVE REPLACEMENT, TRANSFEMORAL N/A 12/05/2016   Procedure: TRANSCATHETER AORTIC VALVE REPLACEMENT, TRANSFEMORAL;  Surgeon: Brett Mocha, MD;  Location: [facility];  Service: Open Heart Surgery;  Laterality: N/A;    Current Medications: Current Meds  Medication Sig  . acetaminophen (TYLENOL) 325 MG tablet Take 325 mg by mouth every 6 (six) hours as needed for moderate pain or headache.   Marland Kitchen acidophilus  (RISAQUAD) CAPS capsule Take 1 capsule by mouth daily.  Marland Kitchen alfuzosin (UROXATRAL) 10 MG 24 hr tablet Take 10 mg by mouth every evening.   Marland Kitchen amLODipine (NORVASC) 5 MG tablet Take 1 tablet (5 mg total) by mouth daily.  Marland Kitchen aspirin EC 81 MG EC tablet Take 1 tablet (81 mg total) by mouth daily.  Marland Kitchen atorvastatin (LIPITOR) 40 MG tablet TAKE 1 TABLET DAILY  . finasteride (PROSCAR) 5 MG tablet Take 5 mg by mouth daily.  . Inositol Niacinate (NIACIN FLUSH FREE) 500 MG CAPS Take 1 capsule by mouth daily.  . metoprolol tartrate (LOPRESSOR) 25 MG tablet TAKE 1 TABLET TWICE A DAY  . niacin (NIASPAN) 1000 MG CR tablet TAKE 1 TABLET AT BEDTIME     Allergies:   Levaquin [levofloxacin in d5w]   Social History   Socioeconomic History  . Marital status: Married    Spouse name: Not on file  . Number of children: 3  . Years of education: Not on file  . Highest education level: Not on file  Occupational History  . Occupation: Retired  Scientific laboratory technician  . Financial resource strain: Not on file  . Food insecurity:    Worry: Not on file    Inability: Not on file  . Transportation needs:    Medical: Not on file    Non-medical: Not on file  Tobacco Use  . Smoking status: Former Smoker    Packs/day: 1.00    Years: 40.00    Pack years: 40.00    Types: Cigarettes    Last attempt to quit: 01/01/2016    Years since quitting: 2.5  . Smokeless tobacco: Never Used  Substance and Sexual Activity  . Alcohol use: No    Alcohol/week: 0.0 standard drinks    Frequency: Never  . Drug use: No  . Sexual activity: Not on file  Lifestyle  . Physical activity:    Days per week: Not on file    Minutes per session: Not on file  . Stress: Not on file  Relationships  . Social connections:    Talks on phone: Not on file    Gets together: Not on file    Attends religious service: Not on file    Active member of club or organization: Not on file    Attends meetings of clubs or organizations: Not on file    Relationship  status: Not on file  Other Topics Concern  . Not on file  Social History Narrative   Married 1988 2nd marriage. 1 child from 2nd marriage (1994). 2 children from first marriage. 3-4 grandkids-broken relationship with his son though.       Retired from [employer] after 36 years in 1999. 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       Hobbies: gardening, formerly bowling and pool, family time     Family History: The patient's family history includes Dementia in his mother; Heart attack in his father. There is no history of Colon cancer, Colon polyps, Rectal cancer, or Stomach cancer.  ROS:  Please see the history of present illness.    All other systems reviewed and are negative.  EKGs/Labs/Other Studies Reviewed:    The following studies were reviewed today: 2D Echo 01-25-2018: Study Conclusions  - Left ventricle: The cavity size was normal. Systolic function was   normal. The estimated ejection fraction was in the range of 60%   to 65%. Wall motion was normal; there were no regional wall   motion abnormalities. Doppler parameters are consistent with   abnormal left ventricular relaxation (grade 1 diastolic   dysfunction). Doppler parameters are consistent with high   ventricular filling pressure. - Aortic valve: A 67mm Edwards Sapien3 TAVR bioprosthesis was   present. There was trivial regurgitation. Peak velocity (S): 197   cm/s. Mean gradient (S): 8 mm Hg. Valve area (VTI): 2.09 cm^2.   Valve area (Vmax): 2.18 cm^2. Valve area (Vmean): 2.02 cm^2. - Mitral valve: Transvalvular velocity was within the normal range.   There was no evidence for stenosis. There was mild regurgitation.   Valve area by pressure half-time: 1.72 cm^2. Valve area by   continuity equation (using LVOT flow): 1.84 cm^2. - Left atrium: The atrium was moderately dilated. - Right ventricle: The cavity size was normal. Wall thickness was   normal. Systolic function was normal. - Right atrium:  The atrium was mildly dilated. - Atrial septum: No defect or patent foramen ovale was identified   by color flow Doppler. - Tricuspid valve: There was trivial regurgitation. - Pulmonary arteries: Systolic pressure was within the normal   range. PA peak pressure: 21 mm Hg (S).  EKG:  EKG is ordered today.  The ekg ordered today demonstrates NSR 74 bpm, cannot rule out anterior infarct, age undetermined  Recent Labs: 07/20/2017: ALT 14 11/01/2017: BUN 21; Creatinine, Ser 1.57; Hemoglobin 9.3; Platelets 322; Potassium 3.8; Sodium 139  Recent Lipid Panel    Component Value Date/Time   CHOL 128 03/17/2016 0756   TRIG 80.0 03/17/2016 0756   TRIG 93 06/25/2006 0901   HDL 52.20 03/17/2016 0756   CHOLHDL 2 03/17/2016 0756   VLDL 16.0 03/17/2016 0756   LDLCALC 60 03/17/2016 0756   LDLDIRECT 65.0 07/20/2017 1058    Physical Exam:    VS:  BP 118/60   Pulse 74   Ht 5\' 6"  (1.676 m)   Wt 153 lb 12.8 oz (69.8 kg)   SpO2 97%   BMI 24.82 kg/m     Wt Readings from Last 3 Encounters:  07/15/18 153 lb 12.8 oz (69.8 kg)  01/25/18 147 lb (66.7 kg)  11/02/17 145 lb (65.8 kg)     GEN:  Well nourished, well developed in no acute distress HEENT: Normal NECK: No JVD; BL carotid bruits LYMPHATICS: No lymphadenopathy CARDIAC: RRR, 2/6 SEM at the RUSB RESPIRATORY:  Clear to auscultation without rales, wheezing or rhonchi  ABDOMEN: Soft, non-tender, non-distended MUSCULOSKELETAL:  No edema; No deformity  SKIN: Warm and dry NEUROLOGIC:  Alert and oriented x 3 PSYCHIATRIC:  Normal affect   ASSESSMENT:    1. Coronary artery disease involving native coronary artery of native heart without angina pectoris   2. Carotid stenosis, asymptomatic, unspecified laterality   3. Mixed hyperlipidemia   4. Aortic valve disorder    PLAN:    In order of problems listed above:  1. The patient is stable without symptoms of angina.  He had graft patency documented at the time of his last cardiac  catheterization.  He continues on antiplatelet therapy with aspirin as  well as a high intensity statin drug and a beta-blocker. 2. Most recent carotid Doppler is reviewed and demonstrates significant plaquing bilaterally but there is less than 40% stenosis based on Doppler criteria.  Continue medical management.  Repeat scan is planned in April. 3. Most recent lipids reviewed.  He is treated with a high intensity statin drug. 4. Most recent echo reviewed with normal function of his aortic valve bioprosthesis.  No changes are recommended today.  His exam is stable.   Medication Adjustments/Labs and Tests Ordered: Current medicines are reviewed at length with the patient today.  Concerns regarding medicines are outlined above.  Orders Placed This Encounter  Procedures  . EKG 12-Lead   No orders of the defined types were placed in this encounter.   Patient Instructions  Medication Instructions:  Your provider recommends that you continue on your current medications as directed. Please refer to the Current Medication list given to you today.    Labwork: None  Testing/Procedures: None  Follow-Up: Your provider wants you to follow-up in: 6 months with Dr. Burt Knack. You will receive a reminder letter in the mail two months in advance. If you don't receive a letter, please call our office to schedule the follow-up appointment.    Any Other Special Instructions Will Be Listed Below (If Applicable).     If you need a refill on your cardiac medications before your next appointment, please call your pharmacy.      Signed, Brett Mocha, MD  07/15/2018 5:13 PM    [facility]

## 2018-07-15 NOTE — Patient Instructions (Signed)

## 2018-07-19 ENCOUNTER — Encounter: Payer: Medicare Other | Admitting: Family Medicine

## 2018-08-01 ENCOUNTER — Ambulatory Visit (INDEPENDENT_AMBULATORY_CARE_PROVIDER_SITE_OTHER): Payer: Medicare Other

## 2018-08-01 VITALS — BP 140/58 | HR 77 | Temp 97.7°F | Ht 66.0 in | Wt 154.4 lb

## 2018-08-01 DIAGNOSIS — Z0000 Encounter for general adult medical examination without abnormal findings: Secondary | ICD-10-CM | POA: Diagnosis not present

## 2018-08-01 DIAGNOSIS — H2513 Age-related nuclear cataract, bilateral: Secondary | ICD-10-CM | POA: Diagnosis not present

## 2018-08-01 DIAGNOSIS — H524 Presbyopia: Secondary | ICD-10-CM | POA: Diagnosis not present

## 2018-08-01 DIAGNOSIS — H5213 Myopia, bilateral: Secondary | ICD-10-CM | POA: Diagnosis not present

## 2018-08-01 NOTE — Progress Notes (Signed)
I have personally reviewed the Medicare Annual Wellness Visit and agree with the assessment and plan.  Has follow up with PCP scheduled.   Algis Greenhouse. Jerline Pain, MD 08/01/2018 4:34 PM

## 2018-08-01 NOTE — Patient Instructions (Addendum)
Brett [name] , Thank you for taking time to come for your Medicare Wellness Visit. I appreciate your ongoing commitment to your health goals. Please review the following plan we discussed and let me know if I can assist you in the future.   These are the goals we discussed: Goals    . decrease # of desserts eaten/week.       This is a list of the screening recommended for you and due dates:  Health Maintenance  Topic Date Due  . Colon Cancer Screening  11/25/2014  . Tetanus Vaccine  07/09/2016  . Flu Shot  Completed  . Pneumonia vaccines  Completed     Brett [name] , Thank you for taking time to come for your Medicare Wellness Visit. I appreciate your ongoing commitment to your health goals. Please review the following plan we discussed and let me know if I can assist you in the future.   These are the goals we discussed: Goals    . decrease # of desserts eaten/week.       This is a list of the screening recommended for you and due dates:  Health Maintenance  Topic Date Due  . Colon Cancer Screening  11/25/2014  . Tetanus Vaccine  07/09/2016  . Flu Shot  Completed  . Pneumonia vaccines  Completed    Preventive Care for Adults  A healthy lifestyle and preventive care can promote health and wellness. Preventive health guidelines for adults include the following key practices.  . A routine yearly physical is a good way to check with your health care provider about your health and preventive screening. It is a chance to share any concerns and updates on your health and to receive a thorough exam.  . Visit your dentist for a routine exam and preventive care every 6 months. Brush your teeth twice a day and floss once a day. Good oral hygiene prevents tooth decay and gum disease.  . The frequency of eye exams is based on your age, health, family medical history, use  of contact lenses, and other factors. Follow your health care provider's recommendations for frequency of eye  exams.  . Eat a healthy diet. Foods like vegetables, fruits, whole grains, low-fat dairy products, and lean protein foods contain the nutrients you need without too many calories. Decrease your intake of foods high in solid fats, added sugars, and salt. Eat the right amount of calories for you. Get information about a proper diet from your health care provider, if necessary.  . Regular physical exercise is one of the most important things you can do for your health. Most adults should get at least 150 minutes of moderate-intensity exercise (any activity that increases your heart rate and causes you to sweat) each week. In addition, most adults need muscle-strengthening exercises on 2 or more days a week.  Silver Sneakers may be a benefit available to you. To determine eligibility, you may visit the website: www.silversneakers.com or contact program at 262-316-3697 Mon-Fri between 8AM-8PM.   . Maintain a healthy weight. The body mass index (BMI) is a screening tool to identify possible weight problems. It provides an estimate of body fat based on height and weight. Your health care provider can find your BMI and can help you achieve or maintain a healthy weight.   For adults 20 years and older: ? A BMI below 18.5 is considered underweight. ? A BMI of 18.5 to 24.9 is normal. ? A BMI of 25 to 29.9 is  considered overweight. ? A BMI of 30 and above is considered obese.   . Maintain normal blood lipids and cholesterol levels by exercising and minimizing your intake of saturated fat. Eat a balanced diet with plenty of fruit and vegetables. Blood tests for lipids and cholesterol should begin at age 39 and be repeated every 5 years. If your lipid or cholesterol levels are high, you are over 50, or you are at high risk for heart disease, you may need your cholesterol levels checked more frequently. Ongoing high lipid and cholesterol levels should be treated with medicines if diet and exercise are not  working.  . If you smoke, find out from your health care provider how to quit. If you do not use tobacco, please do not start.  . If you choose to drink alcohol, please do not consume more than 2 drinks per day. One drink is considered to be 12 ounces (355 mL) of beer, 5 ounces (148 mL) of wine, or 1.5 ounces (44 mL) of liquor.  . If you are 3-21 years old, ask your health care provider if you should take aspirin to prevent strokes.  . Use sunscreen. Apply sunscreen liberally and repeatedly throughout the day. You should seek shade when your shadow is shorter than you. Protect yourself by wearing long sleeves, pants, a wide-brimmed hat, and sunglasses year round, whenever you are outdoors.  . Once a month, do a whole body skin exam, using a mirror to look at the skin on your back. Tell your health care provider of new moles, moles that have irregular borders, moles that are larger than a pencil eraser, or moles that have changed in shape or color.

## 2018-08-01 NOTE — Progress Notes (Signed)
Subjective:   Brett [name] is a 77 y.o. male who presents for Medicare Annual/Subsequent preventive examination.  Review of Systems:  No ROS.  Medicare Wellness Visit. Additional risk factors are reflected in the social history. Cardiac Risk Factors include: advanced age (>84men, >22 women);male gender Patient lives with wife in a 2 story home. They have a dog. 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 He has 3 children. He does not have contact with one of his sons. He enjoys spending time with his wife, watching tv, working on his car (cleaning and waxing it). He is a member at [employer].   He goes to bed around 8:30-9am. 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 He gets up 2-3 times a night to go to the bathroom. No CPAP. He normally does not feel rested when he wakes up.    Objective:    Vitals: BP (!) 140/58 (BP Location: Left Arm, Patient Position: Sitting, Cuff Size: Large)   Pulse 77   Temp 97.7 F (36.5 C) (Oral)   Ht 5\' 6"  (1.676 m)   Wt 154 lb 6.4 oz (70 kg)   SpO2 99%   BMI 24.92 kg/m   Body mass index is 24.92 kg/m.  Advanced Directives 08/01/2018 10/26/2017 10/25/2017 10/18/2017 10/11/2017 10/11/2017 09/20/2017  Does Patient Have a Medical Advance Directive? No No No No No No No  Type of Advance Directive - - - - - - -  Does patient want to make changes to medical advance directive? - - - - - - -  Copy of [place] in Chart? - - - - - - -  Would patient like information on creating a medical advance directive? Yes (MAU/Ambulatory/Procedural Areas - Information given) - No - Patient declined No - Patient declined No - Patient declined No - Patient declined No - Patient declined    Tobacco Social History   Tobacco Use  Smoking Status Former Smoker  . Packs/day: 1.00  . Years: 40.00  . Pack years: 40.00  . Types: Cigarettes  . Last attempt to quit: 01/01/2016  . Years since quitting: 2.5  Smokeless Tobacco Never Used       Counseling given: Not Answered       Past Medical History:  Diagnosis Date  . Carotid stenosis   . Elevated PSA   . 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 Glomerulonephritis 1961  . Hyperglycemia   . Hyperlipidemia   . Hyperplastic colon polyp 11/24/2009   x7  . Hypertension   . Iron deficiency anemia due to chronic blood loss 08/15/2016  . Leukocytosis 08/02/2016  . 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  . PVD (peripheral vascular disease) ([place])   . 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 BYPASS GRAFT  2006  . CORONARY ARTERY BYPASS GRAFT  12/07/2004   CABG x5 Dr [name]  . CYSTOSCOPY WITH LITHOLAPAXY N/A 09/25/2017   Procedure: CYSTOSCOPY WITH LITHOLAPAXY;  Surgeon: Cleon Gustin, MD;  Location: [facility];  Service: Urology;  Laterality: N/A;  . CYSTOSCOPY WITH RETROGRADE PYELOGRAM, URETEROSCOPY AND STENT PLACEMENT Left 11/02/2017   Procedure: CYSTOSCOPY WITH RETROGRADE  PYELOGRAM, LEFT URETEROSCOPY, LEFT STENT EXCHANGE;  Surgeon: Cleon Gustin, MD;  Location: [facility];  Service: Urology;  Laterality: Left;  . HOLMIUM LASER APPLICATION N/A 3/82/5053   Procedure: HOLMIUM LASER APPLICATION;  Surgeon: Cleon Gustin, MD;  Location: [facility];  Service: Urology;  Laterality: N/A;  . HOLMIUM LASER APPLICATION Left 04/13/6733   Procedure: HOLMIUM LASER APPLICATION;  Surgeon: Cleon Gustin, MD;  Location[facility];  Service: Urology;  Laterality: Left;  . HOLMIUM LASER APPLICATION Left 1/93/7902   Procedure: HOLMIUM LASER APPLICATION;  Surgeon: Cleon Gustin, MD;  [facility];  Service: Urology;  Laterality: Left;  . IR URETERAL STENT LEFT NEW ACCESS W/O SEP  NEPHROSTOMY CATH  10/11/2017  . NEPHROLITHOTOMY Left 10/11/2017   Procedure: NEPHROLITHOTOMY PERCUTANEOUS;  Surgeon: Cleon Gustin, MD;  [facility];  Service: Urology;  Laterality: Left;  . RIGHT HEART CATH AND CORONARY/GRAFT ANGIOGRAPHY N/A 11/21/2016   Procedure: Right Heart Cath and Coronary/Graft Angiography;  Surgeon: Sherren Mocha, MD;  Location: [facility];  Service: Cardiovascular;  Laterality: N/A;  . TEE WITHOUT CARDIOVERSION N/A 12/05/2016   Procedure: TRANSESOPHAGEAL ECHOCARDIOGRAM (TEE);  Surgeon: Sherren Mocha, MD;  Location: [facility];  Service: Open Heart Surgery;  Laterality: N/A;  . TRANSCATHETER AORTIC VALVE REPLACEMENT, TRANSFEMORAL N/A 12/05/2016   Procedure: TRANSCATHETER AORTIC VALVE REPLACEMENT, TRANSFEMORAL;  Surgeon: Sherren Mocha, MD;  Location: [facility];  Service: Open Heart Surgery;  Laterality: N/A;   Family History  Problem Relation Age of Onset  . Heart attack Father   . Dementia Mother   . Kidney Stones Mother   . Diabetes Maternal Grandfather   . Colon cancer Neg Hx   . Colon polyps Neg Hx   . Rectal cancer Neg Hx   . Stomach cancer Neg Hx    Social History   Socioeconomic History  . Marital status: Married    Spouse name: Not on file  . Number of children: 3  . Years of education: Not on file  . Highest education level: Not on file  Occupational History  . Occupation: Retired  Scientific laboratory technician  . Financial resource strain: Not on file  . Food insecurity:    Worry: Not on file    Inability: Not on file  . Transportation needs:    Medical: Not on file    Non-medical: Not on file  Tobacco Use  . Smoking status: Former Smoker    Packs/day: 1.00    Years: 40.00    Pack years: 40.00    Types: Cigarettes    Last attempt to quit: 01/01/2016    Years since quitting: 2.5  . Smokeless tobacco: Never Used  Substance and Sexual Activity  . Alcohol use: No    Alcohol/week: 0.0 standard drinks    Frequency: Never  . Drug use: No  . Sexual  activity: Yes  Lifestyle  . Physical activity:    Days per week: Not on file    Minutes per session: Not on file  . Stress: Not on file  Relationships  . Social connections:    Talks on phone: Not on file    Gets together: Not on file    Attends religious service: Not on file    Active member of club or organization: Not on file    Attends meetings of clubs or organizations: Not on file    Relationship status: Not on file  Other Topics Concern  . Not on file  Social History Narrative  Married 1988 2nd marriage. 1 child from 2nd marriage (1994). 2 children from first marriage. 3-4 grandkids-broken relationship with his son though.       Retired from [employer] after 36 years in 1999. 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       Hobbies: gardening, formerly bowling and pool, family time    Outpatient Encounter Medications as of 08/01/2018  Medication Sig  . acetaminophen (TYLENOL) 325 MG tablet Take 325 mg by mouth every 6 (six) hours as needed for moderate pain or headache.   Marland Kitchen acidophilus (RISAQUAD) CAPS capsule Take 1 capsule by mouth daily.  Marland Kitchen alfuzosin (UROXATRAL) 10 MG 24 hr tablet Take 10 mg by mouth every evening.   Marland Kitchen amLODipine (NORVASC) 5 MG tablet Take 1 tablet (5 mg total) by mouth daily.  Marland Kitchen aspirin EC 81 MG EC tablet Take 1 tablet (81 mg total) by mouth daily.  Marland Kitchen atorvastatin (LIPITOR) 40 MG tablet TAKE 1 TABLET DAILY  . finasteride (PROSCAR) 5 MG tablet Take 5 mg by mouth daily.  . Inositol Niacinate (NIACIN FLUSH FREE) 500 MG CAPS Take 1 capsule by mouth daily.  . metoprolol tartrate (LOPRESSOR) 25 MG tablet TAKE 1 TABLET TWICE A DAY  . niacin (NIASPAN) 1000 MG CR tablet TAKE 1 TABLET AT BEDTIME   No facility-administered encounter medications on file as of 08/01/2018.     Activities of Daily Living In your present state of health, do you have any difficulty performing the following activities: 08/01/2018 10/25/2017  Hearing? Y N  Comment  Discussed going to [facility] for hearing testing -  Vision? N N  Difficulty concentrating or making decisions? N N  Walking or climbing stairs? N N  Dressing or bathing? N N  Doing errands, shopping? N N  Preparing Food and eating ? N -  Using the Toilet? N -  In the past six months, have you accidently leaked urine? N -  Do you have problems with loss of bowel control? N -  Managing your Medications? N -  Managing your Finances? N -  Housekeeping or managing your Housekeeping? N -  Some recent data might be hidden    Patient Care Team: Marin Olp, MD as PCP - General (Family Medicine) Sherren Mocha, MD as PCP - Cardiology (Cardiology) Ralene Bathe, MD as Consulting Physician (Ophthalmology) Rexene Alberts, MD as Consulting Physician (Cardiothoracic Surgery) Alyson Ingles Candee Furbish, MD as Consulting Physician (Urology)   Assessment:   This is a routine wellness examination for Willi.  Exercise Activities and Dietary recommendations Current Exercise Habits: The patient does not participate in regular exercise at present, Exercise limited by: cardiac condition(s)   Breakfast: english muffin, cereal, hard boiled egg, omelette, coffee with milk and sugar  Lunch: sandwich, potato chips, pretzels, popcorn, cheese sticks, normally drinks a regular soda  Dinner: Healthy Choice frozen meals, fruit, a regular soda to drink  Snacks potato chips and popcorn Goals    . decrease # of desserts eaten/week.       Fall Risk Fall Risk  08/01/2018 05/16/2017 03/16/2016  Falls in the past year? 0 No No      Depression Screen PHQ 2/9 Scores 08/01/2018 07/20/2017 05/16/2017 03/16/2016  PHQ - 2 Score 0 1 1 0  PHQ- 9 Score - 5 6 -    Cognitive Function MMSE - Mini Mental State Exam 08/01/2018 05/16/2017  Orientation to time 5 5  Orientation to Place 5 5  Registration 3 3  Attention/  Calculation 5 5  Recall 3 3  Language- name 2 objects 2 2  Language- repeat 1 1  Language-  follow 3 step command 3 3  Language- read & follow direction 1 1  Write a sentence 1 1  Copy design 1 1  Total score 30 30        Immunization History  Administered Date(s) Administered  . Influenza Split 05/12/2012  . Influenza Whole 07/24/2007, 05/14/2008, 05/17/2009  . Influenza-Unspecified 05/19/2014, 05/07/2016, 04/29/2017, 04/13/2018  . Pneumococcal Conjugate-13 03/16/2016  . Pneumococcal Polysaccharide-23 07/24/2007  . Td 07/09/2006  . Zoster 06/12/2014      Screening Tests Health Maintenance  Topic Date Due  . COLONOSCOPY  11/25/2014  . TETANUS/TDAP  07/09/2016  . INFLUENZA VACCINE  Completed  . PNA vac Low Risk Adult  Completed         Plan:   Follow Up with PCP as Advised  I have personally reviewed and noted the following in the patient's chart:   . Medical and social history . Use of alcohol, tobacco or illicit drugs  . Current medications and supplements . Functional ability and status . Nutritional status . Physical activity . Advanced directives . List of other physicians . Vitals . Screenings to include cognitive, depression, and falls . Referrals and appointments  In addition, I have reviewed and discussed with patient certain preventive protocols, quality metrics, and best practice recommendations. A written personalized care plan for preventive services as well as general preventive health recommendations were provided to patient.     [place], [place]  30/94/0768

## 2018-08-01 NOTE — Progress Notes (Signed)
PCP notes:Last OV 07/20/2017   Health maintenance:Colonoscopy-will discuss with Dr. Yong Channel at next visit Tetanus-will stop by pharmacy [facility] on way home to get   Abnormal screenings: None   Patient concerns: Right knee Pain, Memory Concerns MMSE score of 30, wife wants patient placed on a med for vascular dementia or to help with memory, Feels he repeats the same questions, forgets why he walks in the rooms   Nurse concerns: None   Next PCP appt: Schedule as Needed

## 2018-08-20 ENCOUNTER — Encounter

## 2018-08-20 ENCOUNTER — Ambulatory Visit: Payer: Medicare Other | Admitting: Family Medicine

## 2018-08-23 ENCOUNTER — Encounter: Payer: Self-pay | Admitting: Family Medicine

## 2018-08-23 ENCOUNTER — Ambulatory Visit (INDEPENDENT_AMBULATORY_CARE_PROVIDER_SITE_OTHER): Payer: Medicare Other | Admitting: Family Medicine

## 2018-08-23 VITALS — BP 138/68 | HR 75 | Temp 97.5°F | Ht 66.0 in | Wt 154.0 lb

## 2018-08-23 DIAGNOSIS — I10 Essential (primary) hypertension: Secondary | ICD-10-CM

## 2018-08-23 DIAGNOSIS — E785 Hyperlipidemia, unspecified: Secondary | ICD-10-CM

## 2018-08-23 DIAGNOSIS — I739 Peripheral vascular disease, unspecified: Secondary | ICD-10-CM

## 2018-08-23 DIAGNOSIS — R413 Other amnesia: Secondary | ICD-10-CM

## 2018-08-23 LAB — COMPREHENSIVE METABOLIC PANEL
ALK PHOS: 72 U/L (ref 39–117)
ALT: 12 U/L (ref 0–53)
AST: 21 U/L (ref 0–37)
Albumin: 4.4 g/dL (ref 3.5–5.2)
BILIRUBIN TOTAL: 1 mg/dL (ref 0.2–1.2)
BUN: 19 mg/dL (ref 6–23)
CO2: 28 mEq/L (ref 19–32)
Calcium: 9.9 mg/dL (ref 8.4–10.5)
Chloride: 107 mEq/L (ref 96–112)
Creatinine, Ser: 1.04 mg/dL (ref 0.40–1.50)
GFR: 69.22 mL/min (ref >60.00)
GLUCOSE: 102 mg/dL — AB (ref 70–99)
Potassium: 4.7 mEq/L (ref 3.5–5.1)
Sodium: 143 mEq/L (ref 135–145)
TOTAL PROTEIN: 6.7 g/dL (ref 6.0–8.3)

## 2018-08-23 LAB — LIPID PANEL
Cholesterol: 132 mg/dL (ref 0–200)
HDL: 42.6 mg/dL (ref >39.00)
LDL Cholesterol: 69 mg/dL (ref 0–99)
NONHDL: 89.2
Total CHOL/HDL Ratio: 3
Triglycerides: 100 mg/dL (ref 0.0–149.0)
VLDL: 20 mg/dL (ref 0.0–40.0)

## 2018-08-23 LAB — CBC
HCT: 41.4 % (ref 39.0–52.0)
HEMOGLOBIN: 14.4 g/dL (ref 13.0–17.0)
MCHC: 34.7 g/dL (ref 30.0–36.0)
MCV: 96.5 fl (ref 78.0–100.0)
Platelets: 210 10*3/uL (ref 150.0–400.0)
RBC: 4.29 Mil/uL (ref 4.22–5.81)
RDW: 14.2 % (ref 11.5–15.5)
WBC: 7.9 10*3/uL (ref 4.0–10.5)

## 2018-08-23 LAB — VITAMIN B12: Vitamin B-12: 922 pg/mL — ABNORMAL HIGH (ref 211–911)

## 2018-08-23 LAB — TSH: TSH: 0.72 u[IU]/mL (ref 0.35–4.50)

## 2018-08-23 NOTE — Assessment & Plan Note (Signed)
S: Patient seen on 08/01/18 for AWV. MMSE at 30/30 but still with some complaints about memory  Patient states he will go into a room for something and completely forgets why he went in there. Has noted forgetting names much more frequently.   He has taken his dinner medicines at supper and supper meds at dinnertime despite labels for it but he is trying to really work on it to get the regimen back normal.   Had a fair in June at church- was coming off of a lot of ER visits, hematuria, renal stone issues- brought coffee from [employer] instead of [employer]- brought wrong food.   Seems to be better getting further out from illnesses- now slightly better  Mother with frontotemporal dementia.  A/P: Patient very worried about dementia due to mother's history.  MMSE is 30 out of 30 as of late December. -PHQ2 is 0- low risk for depression.  - He agreed to update labs for potential reversible causes including B12, tsh, cbc, cmp -Neurological exam reassuring today -Most concerned about vascular dementia-we discussed doing an MRI but he wants to hold off on that for right now -Discussed neurology referral-declines for now -Does have some hearing loss-we discussed how this can contribute to memory difficulties-he agrees to get tested by audiology and handout given -Ultimately he decided to see how the labs turn out and follow-up in 6 months.  We will follow-up for new or worsening symptoms

## 2018-08-23 NOTE — Assessment & Plan Note (Signed)
S: controlled on metoprolol 25 mg twice a day and amlodipine 5 mg. BP Readings from Last 3 Encounters:  08/23/18 138/68  08/01/18 (!) 140/58  07/15/18 118/60  A/P: Stable at blood pressure goal of <140/90 though high normal. Continue current meds

## 2018-08-23 NOTE — Assessment & Plan Note (Signed)
Patient continues medical management-maximizing medical therapy for management of peripheral vascular disease.  Continues to follow-up with Dr. Burt Knack

## 2018-08-23 NOTE — Progress Notes (Signed)
Subjective:  Brett [name] is a 78 y.o. year old very pleasant male patient who presents for/with See problem oriented charting ROS-reports memory loss.  No headache or blurry vision reported.  No extremity weakness noted.  No edema.  Past Medical History-  Patient Active Problem List   Diagnosis Date Noted  . Coronary artery disease involving native coronary artery of native heart without angina pectoris 12/12/2016    Priority: High  . 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 Elevated PSA 03/17/2016    Priority: High  . Pulmonary nodule 06/12/2014    Priority: High  . TOBACCO ABUSE 07/26/2009    Priority: High  . Carotid stenosis, asymptomatic, unspecified laterality 07/26/2009    Priority: High  . Peripheral vascular disease ([place]) 07/26/2009    Priority: High  . 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 Hyperlipidemia 11/22/2007    Priority: Medium  . Essential hypertension 02/05/2007    Priority: Medium  . Nephrolith 03/17/2016    Priority: Low  . Memory loss 08/23/2018  . Renal calculus 10/11/2017  . Iron deficiency anemia due to chronic blood loss 08/15/2016  . 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    Medications- reviewed and updated Current Outpatient Medications  Medication Sig Dispense Refill  . acetaminophen (TYLENOL) 325 MG tablet Take 325 mg by mouth every 6 (six) hours as needed for moderate pain or headache.     Marland Kitchen acidophilus (RISAQUAD) CAPS capsule Take 1 capsule by mouth daily.    Marland Kitchen alfuzosin (UROXATRAL) 10 MG 24 hr tablet Take 10 mg by mouth every evening.     Marland Kitchen amLODipine (NORVASC) 5 MG tablet Take 1 tablet (5 mg total) by mouth daily. 90 tablet 3  . aspirin EC 81 MG EC tablet Take 1 tablet (81 mg total) by mouth daily.    Marland Kitchen atorvastatin (LIPITOR) 40 MG tablet TAKE 1 TABLET DAILY 90 tablet 3  . finasteride (PROSCAR) 5 MG tablet Take 5 mg by mouth daily.  3   . Inositol Niacinate (NIACIN FLUSH FREE) 500 MG CAPS Take 1 capsule by mouth daily.    . metoprolol tartrate (LOPRESSOR) 25 MG tablet TAKE 1 TABLET TWICE A DAY 180 tablet 3  . niacin (NIASPAN) 1000 MG CR tablet TAKE 1 TABLET AT BEDTIME 90 tablet 3   No current facility-administered medications for this visit.     Objective: BP 138/68 (BP Location: Left Arm, Patient Position: Sitting, Cuff Size: Large)   Pulse 75   Temp (!) 97.5 F (36.4 C) (Oral)   Ht 5\' 6"  (1.676 m)   Wt 154 lb (69.9 kg)   SpO2 96%   BMI 24.86 kg/m  Gen: NAD, resting comfortably CV: RRR no murmurs rubs or gallops Lungs: CTAB no crackles, wheeze, rhonchi Ext: no edema Skin: warm, dry   Assessment/Plan:  Other notes: 1.  Patient wants to see how his memory is doing over the next 6 months and how current work-up goes before deciding on colonoscopy.  He missed the 2017 colonoscopy after his wife's family member died right at the time he was supposed to have a colonoscopy 2.  Had a rough 2019 with kidney stones, hematuria and hospitalizations  Hyperlipidemia S: Hopefully controlled on atorvastatin 40 mg Lab Results  Component Value Date   CHOL 128 03/17/2016   HDL 52.20 03/17/2016   LDLCALC 60 03/17/2016   LDLDIRECT 65.0 07/20/2017   TRIG  80.0 03/17/2016   CHOLHDL 2 03/17/2016   A/P: LDL goal under 70- update nonfasting lipid panel to assess control  Memory loss S: Patient seen on 08/01/18 for AWV. MMSE at 30/30 but still with some complaints about memory  Patient states he will go into a room for something and completely forgets why he went in there. Has noted forgetting names much more frequently.   He has taken his dinner medicines at supper and supper meds at dinnertime despite labels for it but he is trying to really work on it to get the regimen back normal.   Had a fair in June at church- was coming off of a lot of ER visits, hematuria, renal stone issues- brought coffee from [employer] instead of  [employer]- brought wrong food.   Seems to be better getting further out from illnesses- now slightly better  Mother with frontotemporal dementia.  A/P: Patient very worried about dementia due to mother's history.  MMSE is 30 out of 30 as of late December. -PHQ2 is 0- low risk for depression.  - He agreed to update labs for potential reversible causes including B12, tsh, cbc, cmp -Neurological exam reassuring today -Most concerned about vascular dementia-we discussed doing an MRI but he wants to hold off on that for right now -Discussed neurology referral-declines for now -Does have some hearing loss-we discussed how this can contribute to memory difficulties-he agrees to get tested by audiology and handout given -Ultimately he decided to see how the labs turn out and follow-up in 6 months.  We will follow-up for new or worsening symptoms   Peripheral vascular disease [facility]) Patient continues medical management-maximizing medical therapy for management of peripheral vascular disease.  Continues to follow-up with Dr. Burt Knack  Essential hypertension S: controlled on metoprolol 25 mg twice a day and amlodipine 5 mg. BP Readings from Last 3 Encounters:  08/23/18 138/68  08/01/18 (!) 140/58  07/15/18 118/60  A/P: Stable at blood pressure goal of <140/90 though high normal. Continue current meds    Future Appointments  Date Time Provider [place]  02/21/2019 10:40 AM Marin Olp, MD [facility]   Lab/Order associations: Memory loss - Plan: Vitamin B12, TSH, CBC, Comprehensive metabolic panel  Hyperlipidemia, unspecified hyperlipidemia type - Plan: Lipid panel  Peripheral vascular disease ([place])  Essential hypertension   Return precautions advised.  Garret Reddish, MD

## 2018-08-23 NOTE — Assessment & Plan Note (Signed)
S: Hopefully controlled on atorvastatin 40 mg Lab Results  Component Value Date   CHOL 128 03/17/2016   HDL 52.20 03/17/2016   LDLCALC 60 03/17/2016   LDLDIRECT 65.0 07/20/2017   TRIG 80.0 03/17/2016   CHOLHDL 2 03/17/2016   A/P: LDL goal under 70- update nonfasting lipid panel to assess control

## 2018-08-23 NOTE — Patient Instructions (Addendum)
Health Maintenance Due  Topic Date Due  . COLONOSCOPY  11/25/2014    Please stop by lab before you go  Strongly recommend getting hearing tested.  We discussed 3 options if lab work was normal-repeat evaluation in 6 months, get MRI of the brain to evaluate for prior strokes, referred her to neurology- think these options over and let me know

## 2018-12-29 ENCOUNTER — Other Ambulatory Visit: Payer: Self-pay | Admitting: Family Medicine

## 2019-01-19 ENCOUNTER — Other Ambulatory Visit: Payer: Self-pay | Admitting: Cardiovascular Disease

## 2019-02-11 IMAGING — DX DG CHEST 1V PORT
1 series · 1 of 1 positions shown · non-contrast
Comparison: PA and lateral chest 12/01/2016.

CLINICAL DATA: Status post transcatheter aortic valve replacement
today.

EXAM:
PORTABLE CHEST 1 VIEW

[chest ap]
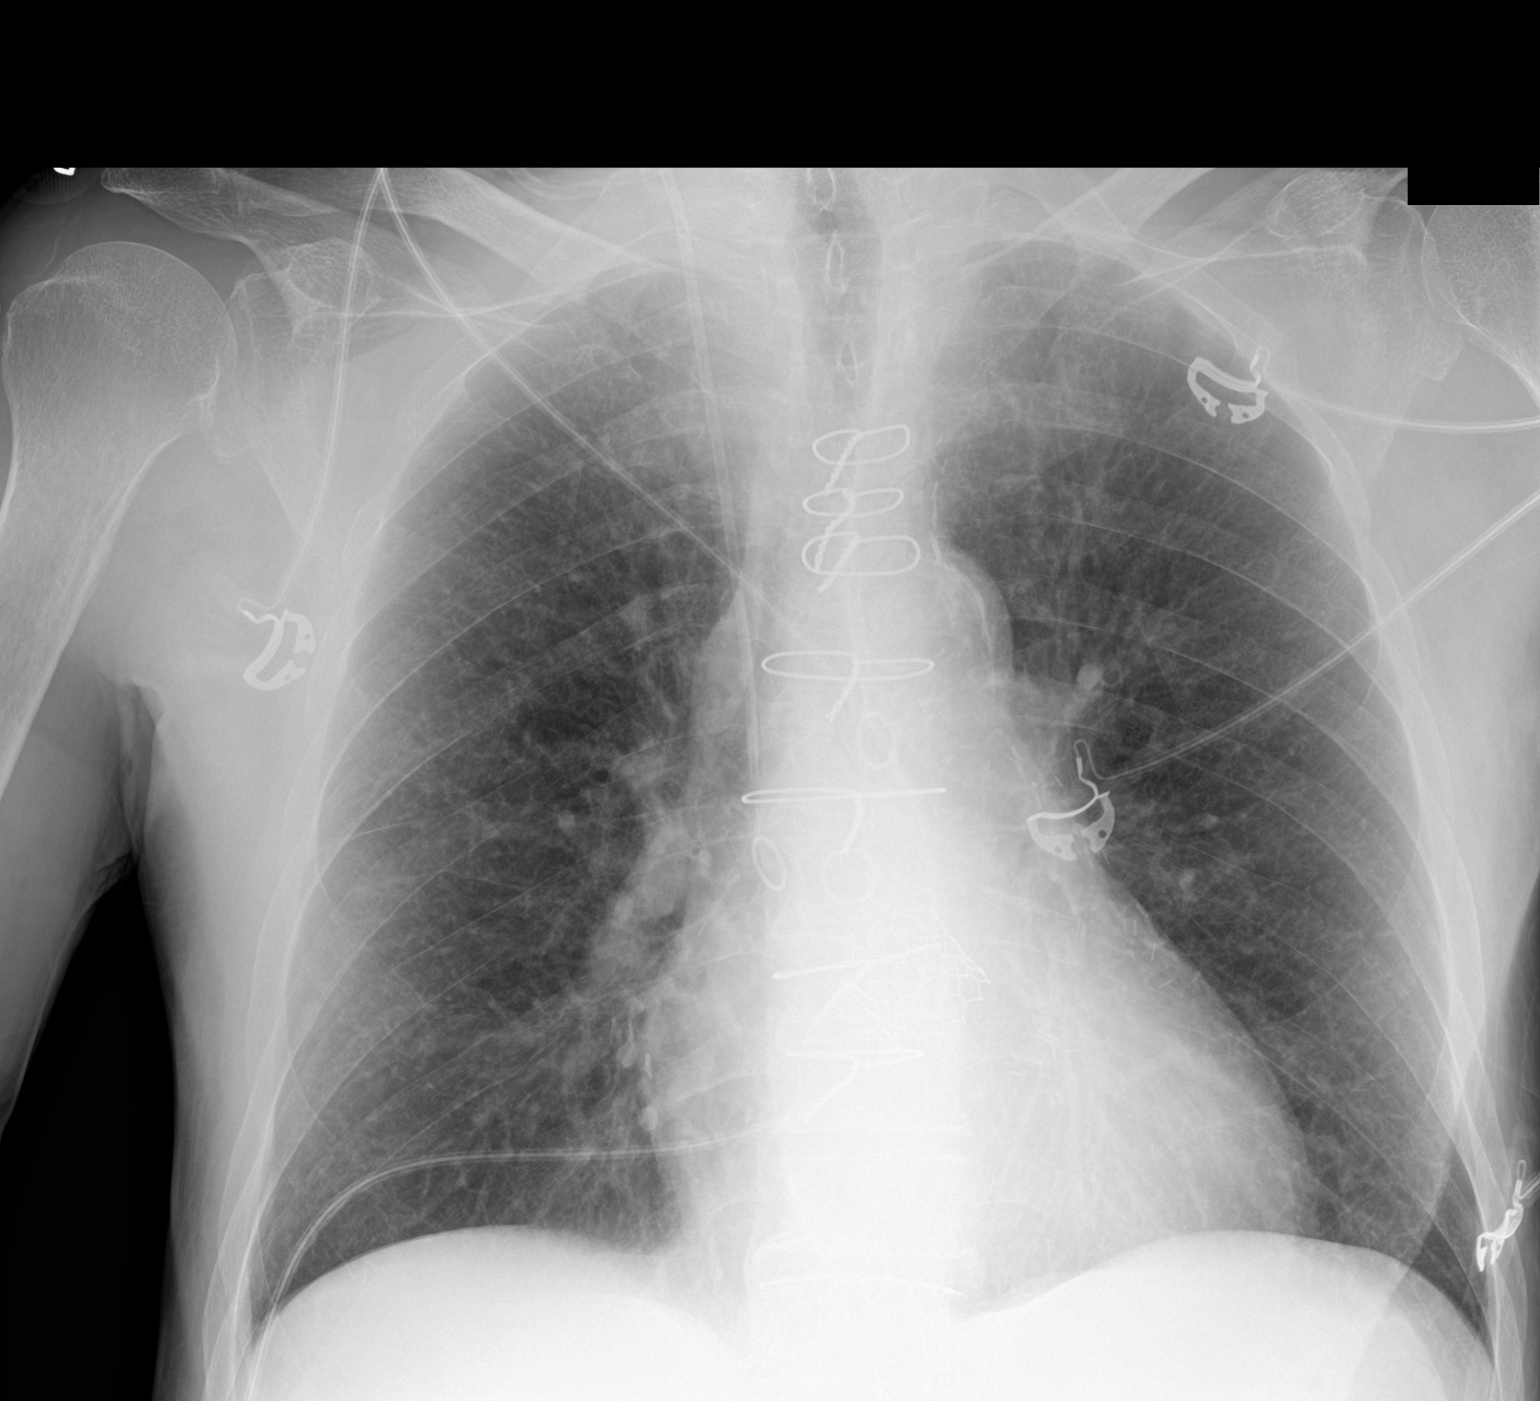

[1 of 1 positions shown; findings below may reference images not displayed]

FINDINGS: Right IJ catheter is in place with the tip in the mid to lower
superior vena cava. Lungs appear emphysematous but are clear. No
pneumothorax. Heart size is upper normal. The patient is status post
CABG. Prosthetic aortic valve is seen.
IMPRESSION: No acute disease.

Right IJ catheter tip projects in the mid to lower superior vena
cava. Negative for pneumothorax.

Emphysema.

## 2019-02-12 ENCOUNTER — Telehealth: Payer: Self-pay | Admitting: Cardiovascular Disease

## 2019-02-12 NOTE — Telephone Encounter (Signed)
Wife of patient wants to know if there is any tests or labs that need to be done prior to the patient's follow-up appointment   Wife also states the the patient sees Dr. Burt Knack for TAVR. If Dr. Burt Knack would like the patient to be seen before his next appt in October let her know as well

## 2019-02-19 DIAGNOSIS — H5213 Myopia, bilateral: Secondary | ICD-10-CM | POA: Diagnosis not present

## 2019-02-19 DIAGNOSIS — H401122 Primary open-angle glaucoma, left eye, moderate stage: Secondary | ICD-10-CM | POA: Diagnosis not present

## 2019-02-19 DIAGNOSIS — H2513 Age-related nuclear cataract, bilateral: Secondary | ICD-10-CM | POA: Diagnosis not present

## 2019-02-21 ENCOUNTER — Ambulatory Visit (INDEPENDENT_AMBULATORY_CARE_PROVIDER_SITE_OTHER): Payer: Medicare Other | Admitting: Family Medicine

## 2019-02-21 ENCOUNTER — Telehealth: Payer: Self-pay | Admitting: Internal Medicine

## 2019-02-21 ENCOUNTER — Other Ambulatory Visit: Payer: Self-pay

## 2019-02-21 ENCOUNTER — Encounter: Payer: Self-pay | Admitting: Family Medicine

## 2019-02-21 VITALS — BP 118/62 | HR 92 | Temp 98.1°F | Ht 66.0 in | Wt 150.6 lb

## 2019-02-21 DIAGNOSIS — E785 Hyperlipidemia, unspecified: Secondary | ICD-10-CM | POA: Diagnosis not present

## 2019-02-21 DIAGNOSIS — R972 Elevated prostate specific antigen [PSA]: Secondary | ICD-10-CM

## 2019-02-21 DIAGNOSIS — Z1211 Encounter for screening for malignant neoplasm of colon: Secondary | ICD-10-CM

## 2019-02-21 DIAGNOSIS — I10 Essential (primary) hypertension: Secondary | ICD-10-CM | POA: Diagnosis not present

## 2019-02-21 DIAGNOSIS — I2510 Atherosclerotic heart disease of native coronary artery without angina pectoris: Secondary | ICD-10-CM

## 2019-02-21 DIAGNOSIS — I739 Peripheral vascular disease, unspecified: Secondary | ICD-10-CM | POA: Diagnosis not present

## 2019-02-21 DIAGNOSIS — Z1283 Encounter for screening for malignant neoplasm of skin: Secondary | ICD-10-CM

## 2019-02-21 DIAGNOSIS — R413 Other amnesia: Secondary | ICD-10-CM | POA: Diagnosis not present

## 2019-02-21 LAB — BASIC METABOLIC PANEL
BUN: 24 mg/dL — ABNORMAL HIGH (ref 6–23)
CO2: 28 mEq/L (ref 19–32)
Calcium: 9.3 mg/dL (ref 8.4–10.5)
Chloride: 106 mEq/L (ref 96–112)
Creatinine, Ser: 1.31 mg/dL (ref 0.40–1.50)
GFR: 52.96 mL/min — ABNORMAL LOW (ref >60.00)
Glucose, Bld: 124 mg/dL — ABNORMAL HIGH (ref 70–99)
Potassium: 4.4 mEq/L (ref 3.5–5.1)
Sodium: 141 mEq/L (ref 135–145)

## 2019-02-21 LAB — CBC
HCT: 39.6 % (ref 39.0–52.0)
Hemoglobin: 13.8 g/dL (ref 13.0–17.0)
MCHC: 35 g/dL (ref 30.0–36.0)
MCV: 97.7 fl (ref 78.0–100.0)
Platelets: 223 10*3/uL (ref 150.0–400.0)
RBC: 4.05 Mil/uL — ABNORMAL LOW (ref 4.22–5.81)
RDW: 14.3 % (ref 11.5–15.5)
WBC: 8.4 10*3/uL (ref 4.0–10.5)

## 2019-02-21 LAB — PSA: PSA: 2.25 ng/mL (ref 0.10–4.00)

## 2019-02-21 NOTE — Assessment & Plan Note (Signed)
S: Patient continues to complain of memory issues.  He primarily has short-term memory issues.  Depression screening has been negative.  Labs including B12 and TSH have been normal.  They have declined STD testing.  MMSE 30/30 late decmber.  Does have hearing loss but has declined audiology follow-up in the past.  Patient with several stressors-wife actually feels he was doing better until the stressors started.  They have had some financial issues with things and to fix at home.  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  He also has had some vision issues and will be following up with ophthalmology.  He is not gotten lost driving at any point. A/P: Continued memory issues but with normal MMSE on prior testing. 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

## 2019-02-21 NOTE — Telephone Encounter (Signed)
There is a referral to schedule pt for a colonoscopy.  It looks like he was previously scheduled at the hospital.

## 2019-02-21 NOTE — Progress Notes (Signed)
Phone [phone]   Subjective:  Brett [name] is a 78 y.o. year old very pleasant male patient who presents for/with See problem oriented charting Chief Complaint  Patient presents with  . Follow-up    Not fasting today.   . Hypertension  . PVD  . Coronary Artery Disease  . Anemia  . Hyperlipidemia  . Elevated PSA  . Memory Loss  . Glaucoma   ROS-no fever/chills/nausea/vomiting.  No sore throat noted.  Past Medical History-  Patient Active Problem List   Diagnosis Date Noted  . Memory loss 08/23/2018    Priority: High  . Coronary artery disease involving native coronary artery of native heart without angina pectoris 12/12/2016    Priority: High  . 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 Elevated PSA 03/17/2016    Priority: High  . Pulmonary nodule 06/12/2014    Priority: High  . TOBACCO ABUSE 07/26/2009    Priority: High  . Carotid stenosis, asymptomatic, unspecified laterality 07/26/2009    Priority: High  . Peripheral vascular disease ([place]) 07/26/2009    Priority: High  . 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 Hyperlipidemia 11/22/2007    Priority: Medium  . Essential hypertension 02/05/2007    Priority: Medium  . Nephrolith 03/17/2016    Priority: Low  . Renal calculus 10/11/2017  . Iron deficiency anemia due to chronic blood loss 08/15/2016  . 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    Medications- reviewed and updated Current Outpatient Medications  Medication Sig Dispense Refill  . acetaminophen (TYLENOL) 500 MG tablet Take 1,000 mg by mouth every morning.     Marland Kitchen acidophilus (RISAQUAD) CAPS capsule Take 1 capsule by mouth daily.    Marland Kitchen alfuzosin (UROXATRAL) 10 MG 24 hr tablet Take 10 mg by mouth every evening.     Marland Kitchen amLODipine (NORVASC) 5 MG tablet Take 1 tablet (5 mg total) by mouth daily. 90 tablet 3  . Ascorbic Acid (VITAMIN C PO) Take by mouth.     Marland Kitchen aspirin EC 81 MG EC tablet Take 1 tablet (81 mg total) by mouth daily.    Marland Kitchen atorvastatin (LIPITOR) 40 MG tablet TAKE 1 TABLET DAILY 90 tablet 2  . Calcium Citrate-Vitamin D (CALCIUM + D PO) Take by mouth.    . finasteride (PROSCAR) 5 MG tablet Take 5 mg by mouth daily.  3  . Inositol Niacinate (NIACIN FLUSH FREE) 500 MG CAPS Take 1 capsule by mouth daily.    Marland Kitchen loratadine (CLARITIN) 10 MG tablet Take 10 mg by mouth daily.    . metoprolol tartrate (LOPRESSOR) 25 MG tablet TAKE 1 TABLET TWICE A DAY 180 tablet 3  . niacin (NIASPAN) 1000 MG CR tablet TAKE 1 TABLET AT BEDTIME 90 tablet 3  . Prenatal Vit-Fe Fumarate-FA (M-VIT PO) Take by mouth.    . tolterodine (DETROL LA) 4 MG 24 hr capsule Through urology     No current facility-administered medications for this visit.      Objective:  BP 118/62 (BP Location: Left Arm, Patient Position: Sitting, Cuff Size: Normal)   Pulse 92   Temp 98.1 F (36.7 C) (Oral)   Ht 5\' 6"  (1.676 m)   Wt 150 lb 9.6 oz (68.3 kg)   SpO2 97%   BMI 24.31 kg/m  Gen: NAD, resting comfortably CV: RRR no murmurs rubs or gallops Lungs: CTAB no crackles, wheeze, rhonchi Abdomen: soft/nontender/nondistended/normal bowel sounds Ext:  no edema Skin: warm, dry Neuro: Normal speech and gait    Assessment and Plan   # Memory Loss S: Patient continues to complain of memory issues.  He primarily has short-term memory issues.  Depression screening has been negative.  Labs including B12 and TSH have been normal.  They have declined STD testing.  MMSE 30/30 late decmber.  Does have hearing loss but has declined audiology follow-up in the past.  Patient with several stressors-wife actually feels he was doing better until the stressors started.  They have had some financial issues with things and to fix at home.  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  He also has had some vision issues and will be following up with ophthalmology.  He is not gotten lost driving at any point. A/P: Continued memory issues but with normal MMSE on prior testing. 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  BP Readings from Last 3 Encounters:  02/21/19 118/62  08/23/18 138/68  08/01/18 (!) 140/58  A/P:  Stable. Continue current medications.    #hyperlipidemia S:  controlled on Atorvastatin 40 mg daily and Aspirin 81 mg daily.  Lab Results  Component Value Date   CHOL 132 08/23/2018   HDL 42.60 08/23/2018   LDLCALC 69 08/23/2018   LDLDIRECT 65.0 07/20/2017   TRIG 100.0 08/23/2018   CHOLHDL 3 08/23/2018   A/P:  Stable. Continue current medications.  LDL at goal under 70  # CAD S: Patient is asymptomatic with no chest pain or shortness of breath.  Compliant with atorvastatin and aspirin A/P:  Stable. Continue current medications.     # PVD S: Claudication history and left leg.  Patient is compliant with maximize medical therapy-controlling blood pressure and cholesterol and taking aspirin. A/P:  Stable. Continue current medications.  Continue to follow-up with Dr. Burt Knack  # Anemia S: no anemia on last check Lab Results  Component Value Date   WBC 8.4 02/21/2019   HGB 13.8 02/21/2019   HCT 39.6 02/21/2019   MCV 97.7 02/21/2019   PLT 223.0 02/21/2019  A/P: Hopefully stable- update CBC and have referred to GI for discussion of colonoscopy.  # Elevated PSA S:Taking Finasteride 5 mg daily.  Saw urology in august.  History of biopsy Lab Results  Component Value Date   PSA 2.25  02/21/2019   PSA 2.38 11/06/2017   PSA 2.38 08/24/2017  A/P: Patient would like to continue to trend PSA- has another visit in August and he would rather draw PSA here then at urology .  # Glaucoma S:Newly dx.  Unsure what his treatment will be. Also has cataracts A/P: Encouraged regular ophthalmology follow-up  # encouraged colonoscopy follow up - was due 2011-he finally agrees to referral!  Recommended follow up: 47-month follow-up Future Appointments  Date Time Provider [place]  05/12/2019  2:40 PM Sherren Mocha, MD [facility]  08/25/2019 10:00 AM Yong Channel Brayton Mars, MD [facility]   Lab/Order associations:   ICD-10-CM  1. Essential hypertension  I10 CBC    Basic metabolic panel  2. Hyperlipidemia, unspecified hyperlipidemia type  E78.5   3. Coronary artery disease involving native coronary artery of native heart without angina pectoris  I25.10   4. Peripheral vascular disease (HCC)  I73.9   5. Screen for colon cancer  Z12.11 Ambulatory referral to Gastroenterology  6. Elevated PSA  R97.20 PSA  7. Screening for skin cancer  Z12.83 Ambulatory referral to Dermatology  8. Memory loss  R41.3    Return precautions advised.  Garret Reddish, MD

## 2019-02-21 NOTE — Patient Instructions (Addendum)
Health Maintenance Due  Topic Date Due  . COLONOSCOPY - We will call you within two weeks about your referral to GI. If you do not hear within 3 weeks, give [place] a call.  11/25/2014   Still think going to audiologist would make sense- please call to schedule  You declined MRI or neurology visit for now- reassess in 6 months  Wish you the best with the eyes and driving decision for the fall  We will call you within two weeks about your referral to [facility] dermatology. If you do not hear within 3 weeks, give [place] a call.   Please stop by lab before you go If you do not have mychart- we will call you about results within 5 business days of [place] receiving them.  If you have mychart- we will send your results within 3 business days of [place] receiving them.  If abnormal or we want to clarify a result, we will call or mychart you to make sure you receive the message.  If you have questions or concerns or don't hear within 5-7 days, please send [place] a message or call us.

## 2019-02-21 NOTE — Telephone Encounter (Signed)
Dr. Garret Reddish from [facility].

## 2019-02-24 NOTE — Telephone Encounter (Signed)
Pt will need to be scheduled for an OV prior to rescheduling colon. His colon has to be done at [facility] due to aortic stenosis. Pt has not been seen since 2017.

## 2019-02-24 NOTE — Telephone Encounter (Signed)
Prev colon in 2011 found multiple hyperplastic polyps.  There is no recall for pt or letter sent regarding next colonoscopy.  Please advise.

## 2019-02-26 ENCOUNTER — Other Ambulatory Visit: Payer: Self-pay | Admitting: Cardiovascular Disease

## 2019-02-27 DIAGNOSIS — H401122 Primary open-angle glaucoma, left eye, moderate stage: Secondary | ICD-10-CM | POA: Diagnosis not present

## 2019-03-12 ENCOUNTER — Ambulatory Visit: Payer: Medicare Other | Admitting: Nurse Practitioner

## 2019-03-24 ENCOUNTER — Telehealth: Payer: Self-pay | Admitting: Cardiovascular Disease

## 2019-03-24 NOTE — Telephone Encounter (Signed)
New message:    Patient wife calling stating she will be bring some papers to be signed for her husband job today, and they need them back this week.

## 2019-03-24 NOTE — Telephone Encounter (Signed)
Informed the patient's wife no further orders were placed at last visit.  Reiterated to her Dr. Burt Knack will evaluate Mr. Yoho first then order testing if appropriate. She was grateful for call.

## 2019-03-24 NOTE — Telephone Encounter (Signed)
Paperwork filled out by Dr. Burt Knack and placed at front desk for pick-up. Patient's wife notified. She was grateful for assistance.

## 2019-03-28 DIAGNOSIS — N401 Enlarged prostate with lower urinary tract symptoms: Secondary | ICD-10-CM | POA: Diagnosis not present

## 2019-03-28 DIAGNOSIS — R351 Nocturia: Secondary | ICD-10-CM | POA: Diagnosis not present

## 2019-03-30 DIAGNOSIS — Z23 Encounter for immunization: Secondary | ICD-10-CM | POA: Diagnosis not present

## 2019-04-01 DIAGNOSIS — H25012 Cortical age-related cataract, left eye: Secondary | ICD-10-CM | POA: Diagnosis not present

## 2019-04-01 DIAGNOSIS — H25812 Combined forms of age-related cataract, left eye: Secondary | ICD-10-CM | POA: Diagnosis not present

## 2019-04-01 DIAGNOSIS — H25811 Combined forms of age-related cataract, right eye: Secondary | ICD-10-CM | POA: Diagnosis not present

## 2019-04-01 DIAGNOSIS — H2512 Age-related nuclear cataract, left eye: Secondary | ICD-10-CM | POA: Diagnosis not present

## 2019-04-22 DIAGNOSIS — H2511 Age-related nuclear cataract, right eye: Secondary | ICD-10-CM | POA: Diagnosis not present

## 2019-04-22 DIAGNOSIS — H25811 Combined forms of age-related cataract, right eye: Secondary | ICD-10-CM | POA: Diagnosis not present

## 2019-05-12 ENCOUNTER — Ambulatory Visit (INDEPENDENT_AMBULATORY_CARE_PROVIDER_SITE_OTHER): Payer: Medicare Other | Admitting: Cardiovascular Disease

## 2019-05-12 ENCOUNTER — Encounter: Payer: Self-pay | Admitting: Cardiovascular Disease

## 2019-05-12 ENCOUNTER — Other Ambulatory Visit: Payer: Self-pay

## 2019-05-12 VITALS — BP 110/52 | HR 84 | Ht 66.0 in | Wt 152.2 lb

## 2019-05-12 DIAGNOSIS — I359 Nonrheumatic aortic valve disorder, unspecified: Secondary | ICD-10-CM | POA: Diagnosis not present

## 2019-05-12 DIAGNOSIS — E782 Mixed hyperlipidemia: Secondary | ICD-10-CM

## 2019-05-12 DIAGNOSIS — I10 Essential (primary) hypertension: Secondary | ICD-10-CM | POA: Diagnosis not present

## 2019-05-12 DIAGNOSIS — I2510 Atherosclerotic heart disease of native coronary artery without angina pectoris: Secondary | ICD-10-CM | POA: Diagnosis not present

## 2019-05-12 NOTE — Patient Instructions (Signed)
Medication Instructions:  Your provider recommends that you continue on your current medications as directed. Please refer to the Current Medication list given to you today.    Labwork: None  Testing/Procedures: Your physician has requested that you have a carotid duplex in 1 year. This test is an ultrasound of the carotid arteries in your neck. It looks at blood flow through these arteries that supply the brain with blood. Allow one hour for this exam. There are no restrictions or special instructions.  Your provider has requested that you have an echocardiogram in 1 year. Echocardiography is a painless test that uses sound waves to create images of your heart. It provides your doctor with information about the size and shape of your heart and how well your heart's chambers and valves are working. This procedure takes approximately one hour. There are no restrictions for this procedure.    Follow-Up: You will be called to arrange your 1 year visit with Dr. Burt Knack.

## 2019-05-12 NOTE — Progress Notes (Signed)
Cardiology Office Note:    Date:  05/12/2019   ID:  Brett [name], DOB Feb 03, 1941, MRN [id number]  PCP:  Marin Olp, MD  Cardiologist:  Sherren Mocha, MD  Electrophysiologist:  None   Referring MD: Marin Olp, MD   Chief Complaint  Patient presents with   Coronary Artery Disease    History of Present Illness:    Brett [name] is a 78 y.o. male with a hx of coronary artery disease treated with remote CABG and aortic valve stenosis treated with TAVR in 2018.  Other problems include hypertension, mixed hyperlipidemia, and peripheral arterial disease without significant claudication.  He has been noted to have nonobstructive carotid stenosis.  The patient is here with his wife today. He has been getting along well. Mowed his lawn yesterday with a Chiropractor. He did have some shortness of breath but feels that is normal. No chest pain, lightheadedness, leg swelling. No other complaints.   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   26 mm Edwards Sapien 3 transcatheter heart valve placed via percutaneous right transfemoral approach    Past Surgical History:  Procedure Laterality Date   BYPASS GRAFT  2006   CORONARY ARTERY BYPASS GRAFT  12/07/2004   CABG x5 Dr [name]   CYSTOSCOPY WITH LITHOLAPAXY N/A 09/25/2017   Procedure: CYSTOSCOPY WITH LITHOLAPAXY;  Surgeon: Cleon Gustin, MD;  Location: [facility];  Service: Urology;  Laterality: N/A;   CYSTOSCOPY WITH RETROGRADE PYELOGRAM, URETEROSCOPY AND STENT PLACEMENT Left 11/02/2017   Procedure: CYSTOSCOPY WITH RETROGRADE PYELOGRAM, LEFT URETEROSCOPY, LEFT STENT EXCHANGE;  Surgeon: Cleon Gustin, MD;  Location: [facility];  Service: Urology;  Laterality: Left;   HOLMIUM LASER APPLICATION N/A [id number]   Procedure: HOLMIUM LASER APPLICATION;  Surgeon: Cleon Gustin, MD;  Location: [facility];  Service: Urology;  Laterality: N/A;   HOLMIUM LASER APPLICATION Left [id number]   Procedure: HOLMIUM LASER APPLICATION;  Surgeon: Cleon Gustin, MD;  Location: [facility];  Service: Urology;  Laterality: Left;   HOLMIUM LASER APPLICATION Left 0000000   Procedure: HOLMIUM LASER APPLICATION;  Surgeon: Cleon Gustin, MD;  Location: [facility];  Service: Urology;  Laterality: Left;   IR URETERAL STENT LEFT NEW ACCESS W/O SEP NEPHROSTOMY CATH  10/11/2017   NEPHROLITHOTOMY Left 10/11/2017   Procedure: NEPHROLITHOTOMY PERCUTANEOUS;  Surgeon: Cleon Gustin, MD;  Location: [facility];  Service: Urology;  Laterality: Left;   RIGHT HEART CATH AND CORONARY/GRAFT ANGIOGRAPHY N/A 11/21/2016   Procedure: Right Heart Cath and Coronary/Graft Angiography;  Surgeon: Sherren Mocha, MD;  Location: [place] [facility];  Service: Cardiovascular;  Laterality: N/A;   TEE WITHOUT CARDIOVERSION N/A 12/05/2016   Procedure: TRANSESOPHAGEAL ECHOCARDIOGRAM (TEE);  Surgeon: Sherren Mocha, MD;  Location: [place];  Service: Open Heart Surgery;  Laterality: N/A;   TRANSCATHETER AORTIC VALVE REPLACEMENT, TRANSFEMORAL N/A 12/05/2016   Procedure:  TRANSCATHETER AORTIC VALVE REPLACEMENT, TRANSFEMORAL;  Surgeon: Sherren Mocha, MD;  Location: [place];  Service: Open Heart Surgery;  Laterality: N/A;    Current Medications: Current Meds  Medication Sig   acetaminophen (TYLENOL) 325 MG tablet Take 325 mg by mouth daily.   acidophilus (RISAQUAD) CAPS capsule Take 1 capsule by mouth daily.     alfuzosin (UROXATRAL) 10 MG 24 hr tablet Take 10 mg by mouth every evening.    amLODipine (NORVASC) 5 MG tablet Take 1 tablet (5 mg total) by mouth daily. Please keep upcoming appt for future refills. Thank you   Ascorbic Acid (VITAMIN C PO) Take by mouth.   aspirin EC 81 MG EC tablet Take 1 tablet (81 mg total) by mouth daily.   atorvastatin (LIPITOR) 40 MG tablet TAKE 1 TABLET DAILY   Calcium Citrate-Vitamin D (CALCIUM + D PO) Take by mouth.   DUREZOL 0.05 % EMUL Place 1 drop into the left eye daily.   finasteride (PROSCAR) 5 MG tablet Take 5 mg by mouth daily.   Inositol Niacinate (NIACIN FLUSH FREE) 500 MG CAPS Take 1 capsule by mouth daily.   latanoprost (XALATAN) 0.005 % ophthalmic solution Place 1 drop into both eyes at bedtime.   loratadine (CLARITIN) 10 MG tablet Take 10 mg by mouth daily.   metoprolol tartrate (LOPRESSOR) 25 MG tablet Take 1 tablet (25 mg total) by mouth 2 (two) times daily. Please keep upcoming appt for future refills. Thank you   MULTIPLE VITAMIN PO Take 1 tablet by mouth daily.   niacin (NIASPAN) 1000 MG CR tablet TAKE 1 TABLET AT BEDTIME   tolterodine (DETROL LA) 4 MG 24 hr capsule Through urology     Allergies:   Levaquin [levofloxacin in d5w]   Social History   Socioeconomic History   Marital status: Married    Spouse name: Not on file   Number of children: 3   Years of education: Not on file   Highest education level: Not on file  Occupational History   Occupation: Retired  Scientist, product/process development strain: Not on file   Food insecurity    Worry: Not on file    Inability: Not on Lexicographer needs    Medical: Not on file    Non-medical: Not on file  Tobacco Use   Smoking status: Former Smoker    Packs/day: 1.00    Years: 40.00    Pack years: 40.00    Types: Cigarettes    Quit date: 01/01/2016    Years since quitting: 3.3   Smokeless tobacco: Never Used  Substance and Sexual Activity   Alcohol  use: No    Alcohol/week: 0.0 standard drinks    Frequency: Never   Drug use: No   Sexual activity: Yes  Lifestyle   Physical activity    Days per week: Not on file    Minutes per session: Not on file   Stress: Not on file  Relationships   Social connections    Talks on phone: Not on file    Gets together: Not on file    Attends religious service: Not on file    Active member of club or organization: Not on file    Attends meetings of clubs or organizations: Not on file    Relationship status: Not on file  Other Topics Concern   Not on file  Social History Narrative   Married 1988 2nd marriage. 1 child from 2nd marriage (1994). 2 children from  first marriage. 3-4 grandkids-broken relationship with his son though.       Retired from [employer] after 36 years in 1999. 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       Hobbies: gardening, formerly bowling and pool, family time     Family History: The patient's family history includes Dementia in his mother; Diabetes in his maternal grandfather; Heart attack in his father; Kidney Stones in his mother. There is no history of Colon cancer, Colon polyps, Rectal cancer, or Stomach cancer.  ROS:   Please see the history of present illness.    Positive for forgetfulness. All other systems reviewed and are negative.  EKGs/Labs/Other Studies Reviewed:    The following studies were reviewed today: Echo 01-25-2018: Study Conclusions  - Left ventricle: The cavity size was normal. Systolic function was   normal. The estimated ejection fraction was in the range of 60%   to 65%. Wall motion was normal; there were no regional wall   motion abnormalities. Doppler parameters are consistent with   abnormal left ventricular relaxation (grade 1 diastolic   dysfunction). Doppler parameters are consistent with high   ventricular filling pressure. - Aortic valve: A 52mm Edwards Sapien3 TAVR bioprosthesis was   present. There was  trivial regurgitation. Peak velocity (S): 197   cm/s. Mean gradient (S): 8 mm Hg. Valve area (VTI): 2.09 cm^2.   Valve area (Vmax): 2.18 cm^2. Valve area (Vmean): 2.02 cm^2. - Mitral valve: Transvalvular velocity was within the normal range.   There was no evidence for stenosis. There was mild regurgitation.   Valve area by pressure half-time: 1.72 cm^2. Valve area by   continuity equation (using LVOT flow): 1.84 cm^2. - Left atrium: The atrium was moderately dilated. - Right ventricle: The cavity size was normal. Wall thickness was   normal. Systolic function was normal. - Right atrium: The atrium was mildly dilated. - Atrial septum: No defect or patent foramen ovale was identified   by color flow Doppler. - Tricuspid valve: There was trivial regurgitation. - Pulmonary arteries: Systolic pressure was within the normal   range. PA peak pressure: 21 mm Hg (S).  EKG:  EKG is  ordered today.  The ekg ordered today demonstrates normal sinus rhythm 84 bpm, PACs, otherwise normal.  Recent Labs: 08/23/2018: ALT 12; TSH 0.72 02/21/2019: BUN 24; Creatinine, Ser 1.31; Hemoglobin 13.8; Platelets 223.0; Potassium 4.4; Sodium 141  Recent Lipid Panel    Component Value Date/Time   CHOL 132 08/23/2018 1215   TRIG 100.0 08/23/2018 1215   TRIG 93 06/25/2006 0901   HDL 42.60 08/23/2018 1215   CHOLHDL 3 08/23/2018 1215   VLDL 20.0 08/23/2018 1215   LDLCALC 69 08/23/2018 1215   LDLDIRECT 65.0 07/20/2017 1058    Physical Exam:    VS:  BP (!) 110/52    Pulse 84    Ht 5\' 6"  (1.676 m)    Wt 152 lb 3.2 oz (69 kg)    SpO2 94%    BMI 24.57 kg/m     Wt Readings from Last 3 Encounters:  05/12/19 152 lb 3.2 oz (69 kg)  02/21/19 150 lb 9.6 oz (68.3 kg)  08/23/18 154 lb (69.9 kg)     GEN:  Well nourished, well developed in no acute distress HEENT: Normal NECK: No JVD; No carotid bruits LYMPHATICS: No lymphadenopathy CARDIAC: RRR, 2/6 systolic murmur at the right upper sternal border RESPIRATORY:   Clear to auscultation without rales, wheezing or rhonchi  ABDOMEN:  Soft, non-tender, non-distended MUSCULOSKELETAL:  No edema; No deformity  SKIN: Warm and dry NEUROLOGIC:  Alert and oriented x 3 PSYCHIATRIC:  Normal affect   ASSESSMENT:    1. Coronary artery disease involving native coronary artery of native heart without angina pectoris   2. Mixed hyperlipidemia   3. Aortic valve disease   4. Essential hypertension    PLAN:    In order of problems listed above:  1. The patient is stable without symptoms of angina.  He was noted to have patent bypass grafts at the time of his pre-TAVR catheterization.  He will continue on his current medical program which includes aspirin for antiplatelet therapy, a high intensity statin drug, and a beta-blocker. 2. Treated with a high intensity statin drug.  Last lipids show a cholesterol 132, HDL 43, LDL 69.  He is also treated with Niaspan. 3. Exam essentially unchanged.  Last echo reviewed as above.  I would like to repeat an echocardiogram next year prior to his office visit.  He follows SBE prophylaxis per guidelines. 4. Blood pressure is well controlled on a combination of amlodipine and metoprolol.  The patient will return in 1 year for clinical follow-up.  I am going to arrange an echocardiogram and a carotid Doppler study prior to his visit.   Medication Adjustments/Labs and Tests Ordered: Current medicines are reviewed at length with the patient today.  Concerns regarding medicines are outlined above.  No orders of the defined types were placed in this encounter.  No orders of the defined types were placed in this encounter.   There are no Patient Instructions on file for this visit.   Signed, Sherren Mocha, MD  05/12/2019 3:04 PM    [facility]

## 2019-05-15 ENCOUNTER — Other Ambulatory Visit: Payer: Self-pay | Admitting: Cardiovascular Disease

## 2019-08-25 ENCOUNTER — Ambulatory Visit: Payer: Medicare Other | Admitting: Family Medicine

## 2019-10-13 ENCOUNTER — Other Ambulatory Visit: Payer: Self-pay | Admitting: Cardiovascular Disease

## 2019-10-14 DIAGNOSIS — Z23 Encounter for immunization: Secondary | ICD-10-CM | POA: Diagnosis not present

## 2019-11-11 DIAGNOSIS — Z23 Encounter for immunization: Secondary | ICD-10-CM | POA: Diagnosis not present

## 2019-11-18 ENCOUNTER — Telehealth: Payer: Self-pay | Admitting: Family Medicine

## 2019-11-18 NOTE — Telephone Encounter (Signed)
I left a message asking the patient to call and schedule Medicare AWV with Loma Sousa ([place]).  If patient calls back, please schedule Medicare Wellness Visit at next available opening. Last AWV 08/01/2018 [facility] ([place]

## 2019-11-21 DIAGNOSIS — H401131 Primary open-angle glaucoma, bilateral, mild stage: Secondary | ICD-10-CM | POA: Diagnosis not present

## 2019-12-12 ENCOUNTER — Other Ambulatory Visit: Payer: Self-pay

## 2019-12-12 MED ORDER — NIACIN ER (ANTIHYPERLIPIDEMIC) 1000 MG PO TBCR: 1000.0000 mg | EXTENDED_RELEASE_TABLET | Freq: Every day | ORAL | 3 refills | Status: DC

## 2019-12-12 NOTE — Telephone Encounter (Signed)
LAST APPOINTMENT DATE: 11/18/2019   NEXT APPOINTMENT DATE: Visit date not found had app on 08/25/2019    LAST REFILL: 12/31/2018  QTY: 90 3 rf

## 2020-01-29 ENCOUNTER — Telehealth: Payer: Self-pay

## 2020-01-29 NOTE — Telephone Encounter (Signed)
Brett [name], wife for patient, calling to return 23 call.

## 2020-01-29 NOTE — Telephone Encounter (Signed)
Spoke with the patient's wife (DPR). Scheduled him for visit with Dr. Burt Knack 10/4 and echo 9/30. They understand the vascular scheduler will call to arrange carotids.  She was grateful for assistance.

## 2020-01-29 NOTE — Telephone Encounter (Signed)
Called to arrange 1 year visit with Dr. Burt Knack (due 05/11/2020).  Will get echo and carotids arranged prior to visit.

## 2020-02-26 DIAGNOSIS — M1990 Unspecified osteoarthritis, unspecified site: Secondary | ICD-10-CM | POA: Diagnosis not present

## 2020-02-26 DIAGNOSIS — I25810 Atherosclerosis of coronary artery bypass graft(s) without angina pectoris: Secondary | ICD-10-CM | POA: Diagnosis not present

## 2020-02-26 DIAGNOSIS — R972 Elevated prostate specific antigen [PSA]: Secondary | ICD-10-CM | POA: Diagnosis not present

## 2020-02-26 DIAGNOSIS — I10 Essential (primary) hypertension: Secondary | ICD-10-CM | POA: Diagnosis not present

## 2020-02-26 DIAGNOSIS — D692 Other nonthrombocytopenic purpura: Secondary | ICD-10-CM | POA: Diagnosis not present

## 2020-02-26 DIAGNOSIS — I739 Peripheral vascular disease, unspecified: Secondary | ICD-10-CM | POA: Diagnosis not present

## 2020-02-26 DIAGNOSIS — Z8679 Personal history of other diseases of the circulatory system: Secondary | ICD-10-CM | POA: Diagnosis not present

## 2020-02-26 DIAGNOSIS — Z1331 Encounter for screening for depression: Secondary | ICD-10-CM | POA: Diagnosis not present

## 2020-02-26 DIAGNOSIS — N401 Enlarged prostate with lower urinary tract symptoms: Secondary | ICD-10-CM | POA: Diagnosis not present

## 2020-02-26 DIAGNOSIS — E785 Hyperlipidemia, unspecified: Secondary | ICD-10-CM | POA: Diagnosis not present

## 2020-03-23 ENCOUNTER — Other Ambulatory Visit: Payer: Self-pay | Admitting: Cardiovascular Disease

## 2020-04-24 DIAGNOSIS — Z23 Encounter for immunization: Secondary | ICD-10-CM | POA: Diagnosis not present

## 2020-04-27 ENCOUNTER — Other Ambulatory Visit: Payer: Self-pay | Admitting: Cardiovascular Disease

## 2020-04-27 DIAGNOSIS — I6529 Occlusion and stenosis of unspecified carotid artery: Secondary | ICD-10-CM

## 2020-04-27 NOTE — Addendum Note (Signed)
Addended by: Dalene Carrow on: 04/27/2020 02:36 PM   Modules accepted: Orders

## 2020-04-28 DIAGNOSIS — Z125 Encounter for screening for malignant neoplasm of prostate: Secondary | ICD-10-CM | POA: Diagnosis not present

## 2020-04-28 DIAGNOSIS — E785 Hyperlipidemia, unspecified: Secondary | ICD-10-CM | POA: Diagnosis not present

## 2020-04-30 DIAGNOSIS — R82998 Other abnormal findings in urine: Secondary | ICD-10-CM | POA: Diagnosis not present

## 2020-05-04 ENCOUNTER — Other Ambulatory Visit: Payer: Self-pay | Admitting: Cardiovascular Disease

## 2020-05-04 ENCOUNTER — Other Ambulatory Visit: Payer: Self-pay

## 2020-05-04 ENCOUNTER — Ambulatory Visit (HOSPITAL_COMMUNITY)
Admission: RE | Admit: 2020-05-04 | Discharge: 2020-05-04 | Disposition: A | Discharge status: Home / Self Care | Payer: Medicare Other | Source: Ambulatory Visit | Attending: Cardiovascular Disease | Admitting: Cardiovascular Disease

## 2020-05-04 DIAGNOSIS — I6529 Occlusion and stenosis of unspecified carotid artery: Secondary | ICD-10-CM

## 2020-05-05 DIAGNOSIS — E785 Hyperlipidemia, unspecified: Secondary | ICD-10-CM | POA: Diagnosis not present

## 2020-05-05 DIAGNOSIS — I739 Peripheral vascular disease, unspecified: Secondary | ICD-10-CM | POA: Diagnosis not present

## 2020-05-05 DIAGNOSIS — Z0000 Encounter for general adult medical examination without abnormal findings: Secondary | ICD-10-CM | POA: Diagnosis not present

## 2020-05-05 DIAGNOSIS — R3121 Asymptomatic microscopic hematuria: Secondary | ICD-10-CM | POA: Diagnosis not present

## 2020-05-05 DIAGNOSIS — I69811 Memory deficit following other cerebrovascular disease: Secondary | ICD-10-CM | POA: Diagnosis not present

## 2020-05-05 DIAGNOSIS — I25810 Atherosclerosis of coronary artery bypass graft(s) without angina pectoris: Secondary | ICD-10-CM | POA: Diagnosis not present

## 2020-05-05 DIAGNOSIS — I10 Essential (primary) hypertension: Secondary | ICD-10-CM | POA: Diagnosis not present

## 2020-05-05 DIAGNOSIS — D692 Other nonthrombocytopenic purpura: Secondary | ICD-10-CM | POA: Diagnosis not present

## 2020-05-05 DIAGNOSIS — Z86010 Personal history of colonic polyps: Secondary | ICD-10-CM | POA: Diagnosis not present

## 2020-05-06 ENCOUNTER — Ambulatory Visit (HOSPITAL_COMMUNITY): Payer: Medicare Other | Attending: Cardiology

## 2020-05-06 ENCOUNTER — Other Ambulatory Visit: Payer: Self-pay

## 2020-05-06 DIAGNOSIS — I359 Nonrheumatic aortic valve disorder, unspecified: Secondary | ICD-10-CM | POA: Diagnosis not present

## 2020-05-06 LAB — ECHOCARDIOGRAM COMPLETE
AV Mean grad: 7 mmHg
AV Peak grad: 12.1 mmHg
Ao pk vel: 1.74 m/s
Area-P 1/2: 2.09 cm2
S' Lateral: 2.45 cm

## 2020-05-10 ENCOUNTER — Encounter: Payer: Self-pay | Admitting: Cardiovascular Disease

## 2020-05-10 ENCOUNTER — Other Ambulatory Visit: Payer: Self-pay

## 2020-05-10 ENCOUNTER — Ambulatory Visit (INDEPENDENT_AMBULATORY_CARE_PROVIDER_SITE_OTHER): Payer: Medicare Other | Admitting: Cardiovascular Disease

## 2020-05-10 DIAGNOSIS — E782 Mixed hyperlipidemia: Secondary | ICD-10-CM

## 2020-05-10 DIAGNOSIS — I359 Nonrheumatic aortic valve disorder, unspecified: Secondary | ICD-10-CM

## 2020-05-10 DIAGNOSIS — I6529 Occlusion and stenosis of unspecified carotid artery: Secondary | ICD-10-CM

## 2020-05-10 DIAGNOSIS — I2510 Atherosclerotic heart disease of native coronary artery without angina pectoris: Secondary | ICD-10-CM | POA: Diagnosis not present

## 2020-05-10 DIAGNOSIS — I10 Essential (primary) hypertension: Secondary | ICD-10-CM | POA: Diagnosis not present

## 2020-05-10 NOTE — Progress Notes (Signed)
Cardiology Office Note:    Date:  05/10/2020   ID:  Brett [name], DOB [birth year], MRN [id number]  PCP:  Sueanne Margarita, [place] Cardiologist:  Sherren Mocha, MD  [place] Electrophysiologist:  None   Referring MD: Marin Olp, MD   Chief Complaint  Patient presents with  . Coronary Artery Disease    History of Present Illness:    Brett [name] is a 79 y.o. male with a hx of coronary artery disease with remote CABG.  He then developed progressive symptomatic aortic stenosis and was treated with TAVR in 2018.  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  The patient is here alone today. He is doing well. 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 Today, he denies symptoms of palpitations, chest pain, shortness of breath, orthopnea, PND, lower extremity edema, dizziness, or syncope.   Past Medical History:  Diagnosis Date  . Carotid stenosis   . Elevated PSA   . 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 Glomerulonephritis 1961  . Hyperglycemia   . Hyperlipidemia   . Hyperplastic colon polyp 11/24/2009   x7  . Hypertension   . Iron deficiency anemia due to chronic blood loss 08/15/2016  . Leukocytosis 08/02/2016  . 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  . PVD (peripheral vascular disease) ([place])   . 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 BYPASS GRAFT  2006  . CORONARY ARTERY BYPASS GRAFT  12/07/2004   CABG x5 Dr [name]  . CYSTOSCOPY WITH LITHOLAPAXY N/A 09/25/2017   Procedure: CYSTOSCOPY WITH LITHOLAPAXY;   Surgeon: Cleon Gustin, MD;  [facility];  Service: Urology;  Laterality: N/A;  . CYSTOSCOPY WITH RETROGRADE PYELOGRAM, URETEROSCOPY AND STENT PLACEMENT Left 11/02/2017   Procedure: CYSTOSCOPY WITH RETROGRADE PYELOGRAM, LEFT URETEROSCOPY, LEFT STENT EXCHANGE;  Surgeon: Cleon Gustin, MD;  [facility];  Service: Urology;  Laterality: Left;  . HOLMIUM LASER APPLICATION N/A 03/30/2352   Procedure: HOLMIUM LASER APPLICATION;  Surgeon: Cleon Gustin, MD;  [facility];  Service: Urology;  Laterality: N/A;  . HOLMIUM LASER APPLICATION Left 01/05/4430   Procedure: HOLMIUM LASER APPLICATION;  Surgeon: Cleon Gustin, MD;  [facility];  Service: Urology;  Laterality: Left;  . HOLMIUM LASER APPLICATION Left 5/40/0867   Procedure: HOLMIUM LASER APPLICATION;  Surgeon: Cleon Gustin, MD;  [facility];  Service: Urology;  Laterality: Left;  . IR URETERAL STENT LEFT NEW ACCESS W/O SEP NEPHROSTOMY CATH  10/11/2017  . NEPHROLITHOTOMY Left 10/11/2017   Procedure: NEPHROLITHOTOMY PERCUTANEOUS;  Surgeon: Cleon Gustin, MD;  [facility];  Service: Urology;  Laterality: Left;  . RIGHT HEART CATH AND CORONARY/GRAFT ANGIOGRAPHY N/A 11/21/2016   Procedure: Right Heart Cath and Coronary/Graft Angiography;  Surgeon: Sherren Mocha, MD;  Location: [place] [facility];  Service: Cardiovascular;  Laterality: N/A;  . TEE WITHOUT CARDIOVERSION N/A 12/05/2016   Procedure: TRANSESOPHAGEAL ECHOCARDIOGRAM (TEE);  Surgeon: Sherren Mocha, MD;  Location: [place];  Service: Open Heart Surgery;  Laterality: N/A;  . TRANSCATHETER AORTIC VALVE REPLACEMENT, TRANSFEMORAL N/A 12/05/2016   Procedure:  TRANSCATHETER AORTIC VALVE REPLACEMENT, TRANSFEMORAL;  Surgeon: Sherren Mocha, MD;  Location: [place];  Service: Open Heart Surgery;  Laterality: N/A;    Current Medications: Current Meds  Medication Sig  . acetaminophen (TYLENOL) 325 MG tablet Take 325 mg by mouth daily.  Marland Kitchen acidophilus  (RISAQUAD) CAPS capsule Take 1 capsule by mouth daily.  Marland Kitchen alfuzosin (UROXATRAL) 10 MG 24 hr tablet Take 10 mg by mouth every evening.   Marland Kitchen amLODipine (NORVASC) 5 MG tablet Take 1 tablet (5 mg total) by mouth daily.  . Ascorbic Acid (VITAMIN C PO) Take by mouth.  Marland Kitchen aspirin EC 81 MG EC tablet Take 1 tablet (81 mg total) by mouth daily.  Marland Kitchen atorvastatin (LIPITOR) 40 MG tablet Take 1 tablet (40 mg total) by mouth daily. Please keep upcoming appt in October with Dr. Burt Knack before anymore refills. Thank you  . Calcium Citrate-Vitamin D (CALCIUM + D PO) Take by mouth.  . finasteride (PROSCAR) 5 MG tablet Take 5 mg by mouth daily.  Marland Kitchen latanoprost (XALATAN) 0.005 % ophthalmic solution Place 1 drop into both eyes at bedtime.  Marland Kitchen loratadine (CLARITIN) 10 MG tablet Take 10 mg by mouth daily.  . metoprolol tartrate (LOPRESSOR) 25 MG tablet Take 1 tablet (25 mg total) by mouth 2 (two) times daily.  . MULTIPLE VITAMIN PO Take 1 tablet by mouth daily.  Marland Kitchen tolterodine (DETROL LA) 4 MG 24 hr capsule Through urology     Allergies:   Levaquin [levofloxacin in d5w]   Social History   Socioeconomic History  . Marital status: Married    Spouse name: Not on file  . Number of children: 3  . Years of education: Not on file  . Highest education level: Not on file  Occupational History  . Occupation: Retired  Tobacco Use  . Smoking status: Former Smoker    Packs/day: 1.00    Years: 40.00    Pack years: 40.00    Types: Cigarettes    Quit date: 01/01/2016    Years since quitting: 4.3  . Smokeless tobacco: Never Used  Vaping Use  . Vaping Use: Never used  Substance and Sexual Activity  . Alcohol use: No    Alcohol/week: 0.0 standard drinks  . Drug use: No  . Sexual activity: Yes  Other Topics Concern  . Not on file  Social History Narrative   Married 1988 2nd marriage. 1 child from 2nd marriage (1994). 2 children from first marriage. 3-4 grandkids-broken relationship with his son though.       Retired  from [employer] after 36 years in 1999. 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       Hobbies: gardening, formerly Environmental consultant and pool, family time   Social Determinants of Health   Financial Resource Strain:   . Difficulty of Paying Living Expenses: Not on file  Food Insecurity:   . Worried About Charity fundraiser in the Last Year: Not on file  . Ran Out of Food in the Last Year: Not on file  Transportation Needs:   . Lack of Transportation (Medical): Not on file  . Lack of Transportation (Non-Medical): Not on file  Physical Activity:   . Days of Exercise per Week: Not on file  . Minutes of Exercise per Session: Not on file  Stress:   . Feeling of Stress : Not on file  Social Connections:   . Frequency of Communication with Friends and Family: Not on file  . Frequency of Social  Gatherings with Friends and Family: Not on file  . Attends Religious Services: Not on file  . Active Member of Clubs or Organizations: Not on file  . Attends Archivist Meetings: Not on file  . Marital Status: Not on file     Family History: The patient's family history includes Dementia in his mother; Diabetes in his maternal grandfather; Heart attack in his father; Kidney Stones in his mother. There is no history of Colon cancer, Colon polyps, Rectal cancer, or Stomach cancer.  ROS:   Please see the history of present illness.    All other systems reviewed and are negative.  EKGs/Labs/Other Studies Reviewed:    The following studies were reviewed today: Echo: IMPRESSIONS    1. Left ventricular ejection fraction, by estimation, is 60 to 65%. The  left ventricle has normal function. The left ventricle has no regional  wall motion abnormalities. There is mild left ventricular hypertrophy.  Left ventricular diastolic parameters  are consistent with Grade I diastolic dysfunction (impaired relaxation).  2. Right ventricular systolic function is normal. The right ventricular   size is normal. Tricuspid regurgitation signal is inadequate for assessing  PA pressure.  3. Left atrial size was mildly dilated.  4. The mitral valve is degenerative. Trivial mitral valve regurgitation.  Mild mitral stenosis. The mean mitral valve gradient is 4.0 mmHg. Moderate  mitral annular calcification.  5. S/p TAVR with 26 mm Edwards Sapien 3 THV. Mean gradient 7 mmHg, no  significant stenosis. No significant regurgitation.  6. The inferior vena cava is normal in size with greater than 50%  respiratory variability, suggesting right atrial pressure of 3 mmHg.  Carotid US 05-04-2020: Summary:  Right Carotid: Velocities in the right ICA are consistent with a 1-39%  stenosis.         Non-hemodynamically significant plaque <50% noted in the  CCA.   Left Carotid: Velocities in the left ICA are consistent with a 40-59%  stenosis.        Non-hemodynamically significant plaque <50% noted in the  CCA.   Vertebrals: Bilateral vertebral arteries demonstrate antegrade flow.  Subclavians: Normal flow hemodynamics were seen in bilateral subclavian        arteries.   EKG:  EKG is ordered today.  The ekg ordered today demonstrates NSR 72 bpm, within normal limits  Recent Labs: No results found for requested labs within last 8760 hours.  Recent Lipid Panel    Component Value Date/Time   CHOL 132 08/23/2018 1215   TRIG 100.0 08/23/2018 1215   TRIG 93 06/25/2006 0901   HDL 42.60 08/23/2018 1215   CHOLHDL 3 08/23/2018 1215   VLDL 20.0 08/23/2018 1215   LDLCALC 69 08/23/2018 1215   LDLDIRECT 65.0 07/20/2017 1058    Physical Exam:    VS:  BP 110/60   Pulse 72   Ht 5\' 6"  (1.676 m)   Wt 146 lb 12.8 oz (66.6 kg)   SpO2 98%   BMI 23.69 kg/m     Wt Readings from Last 3 Encounters:  05/10/20 146 lb 12.8 oz (66.6 kg)  05/12/19 152 lb 3.2 oz (69 kg)  02/21/19 150 lb 9.6 oz (68.3 kg)     GEN:  Well nourished, well developed in no acute distress HEENT:  Normal NECK: No JVD; No carotid bruits LYMPHATICS: No lymphadenopathy CARDIAC: RRR, no murmurs, rubs, gallops RESPIRATORY:  Clear to auscultation without rales, wheezing or rhonchi  ABDOMEN: Soft, non-tender, non-distended MUSCULOSKELETAL:  No edema; No deformity  SKIN: Warm and dry NEUROLOGIC:  Alert and oriented x 3 PSYCHIATRIC:  Normal affect   ASSESSMENT:    1. Coronary artery disease involving native coronary artery of native heart without angina pectoris   2. Aortic valve disorder   3. Mixed hyperlipidemia   4. Essential hypertension    PLAN:    In order of problems listed above:  1. The patient is doing very well with no anginal chest pain.  His pre-TAVR cardiac catheterization in 2018 showed continued patency of all of his bypass grafts.  He has quit smoking now for about 4 years.  He is compliant with his medications.  No changes are made today. 2. Recent echo shows normal function of his TAVR bioprosthesis.  He will return for clinical follow-up in 1 year.  Probably will continue with echocardiogram studies every other year at this point.  SBE prophylaxis reviewed. 3. Treated with a high intensity statin drug.  Most recent labs show an LDL cholesterol of 69 mg/dL. 4. Blood pressure is well controlled on amlodipine and metoprolol.   Medication Adjustments/Labs and Tests Ordered: Current medicines are reviewed at length with the patient today.  Concerns regarding medicines are outlined above.  No orders of the defined types were placed in this encounter.  No orders of the defined types were placed in this encounter.   There are no Patient Instructions on file for this visit.   Signed, Sherren Mocha, MD  05/10/2020 9:55 AM    [place]

## 2020-05-10 NOTE — Patient Instructions (Signed)
Medication Instructions:  .instur  *If you need a refill on your cardiac medications before your next appointment, please call your pharmacy*  Follow-Up: At [facility], you and your health needs are our priority.  As part of our continuing mission to provide you with exceptional heart care, we have created designated Provider Care Teams.  These Care Teams include your primary Cardiologist (physician) and Advanced Practice Providers (APPs -  Physician Assistants and Nurse Practitioners) who all work together to provide you with the care you need, when you need it. Your next appointment:   12 month(s) The format for your next appointment:   In Person Provider:   You may see Sherren Mocha, MD or one of the following Advanced Practice Providers on your designated Care Team:    Richardson Dopp, PA-C  Vin Garner, [place]

## 2020-05-11 DIAGNOSIS — Z1212 Encounter for screening for malignant neoplasm of rectum: Secondary | ICD-10-CM | POA: Diagnosis not present

## 2020-05-22 ENCOUNTER — Other Ambulatory Visit: Payer: Self-pay | Admitting: Cardiovascular Disease

## 2020-05-28 DIAGNOSIS — R351 Nocturia: Secondary | ICD-10-CM | POA: Diagnosis not present

## 2020-05-28 DIAGNOSIS — N401 Enlarged prostate with lower urinary tract symptoms: Secondary | ICD-10-CM | POA: Diagnosis not present

## 2020-06-04 DIAGNOSIS — H401131 Primary open-angle glaucoma, bilateral, mild stage: Secondary | ICD-10-CM | POA: Diagnosis not present

## 2020-06-14 ENCOUNTER — Other Ambulatory Visit: Payer: Self-pay | Admitting: Cardiovascular Disease

## 2020-06-29 DIAGNOSIS — Z23 Encounter for immunization: Secondary | ICD-10-CM | POA: Diagnosis not present

## 2020-07-09 ENCOUNTER — Other Ambulatory Visit: Payer: Self-pay | Admitting: Cardiovascular Disease

## 2020-08-25 DIAGNOSIS — M25531 Pain in right wrist: Secondary | ICD-10-CM | POA: Diagnosis not present

## 2020-08-25 DIAGNOSIS — M79641 Pain in right hand: Secondary | ICD-10-CM | POA: Diagnosis not present

## 2020-08-31 DIAGNOSIS — S52514D Nondisplaced fracture of right radial styloid process, subsequent encounter for closed fracture with routine healing: Secondary | ICD-10-CM | POA: Diagnosis not present

## 2020-09-01 ENCOUNTER — Other Ambulatory Visit: Payer: Self-pay | Admitting: Cardiovascular Disease

## 2020-09-21 ENCOUNTER — Other Ambulatory Visit: Payer: Self-pay | Admitting: Cardiovascular Disease

## 2020-09-30 DIAGNOSIS — S52514D Nondisplaced fracture of right radial styloid process, subsequent encounter for closed fracture with routine healing: Secondary | ICD-10-CM | POA: Diagnosis not present

## 2020-10-29 DIAGNOSIS — S52514D Nondisplaced fracture of right radial styloid process, subsequent encounter for closed fracture with routine healing: Secondary | ICD-10-CM | POA: Diagnosis not present

## 2020-11-08 DIAGNOSIS — I69811 Memory deficit following other cerebrovascular disease: Secondary | ICD-10-CM | POA: Diagnosis not present

## 2020-11-08 DIAGNOSIS — I10 Essential (primary) hypertension: Secondary | ICD-10-CM | POA: Diagnosis not present

## 2020-11-08 DIAGNOSIS — S62109D Fracture of unspecified carpal bone, unspecified wrist, subsequent encounter for fracture with routine healing: Secondary | ICD-10-CM | POA: Diagnosis not present

## 2020-11-08 DIAGNOSIS — R3121 Asymptomatic microscopic hematuria: Secondary | ICD-10-CM | POA: Diagnosis not present

## 2020-11-08 DIAGNOSIS — I25810 Atherosclerosis of coronary artery bypass graft(s) without angina pectoris: Secondary | ICD-10-CM | POA: Diagnosis not present

## 2020-11-08 DIAGNOSIS — E785 Hyperlipidemia, unspecified: Secondary | ICD-10-CM | POA: Diagnosis not present

## 2020-11-08 DIAGNOSIS — R490 Dysphonia: Secondary | ICD-10-CM | POA: Diagnosis not present

## 2020-11-08 DIAGNOSIS — J302 Other seasonal allergic rhinitis: Secondary | ICD-10-CM | POA: Diagnosis not present

## 2020-11-08 DIAGNOSIS — N401 Enlarged prostate with lower urinary tract symptoms: Secondary | ICD-10-CM | POA: Diagnosis not present

## 2020-11-08 DIAGNOSIS — I739 Peripheral vascular disease, unspecified: Secondary | ICD-10-CM | POA: Diagnosis not present

## 2020-11-08 DIAGNOSIS — Z86010 Personal history of colonic polyps: Secondary | ICD-10-CM | POA: Diagnosis not present

## 2020-11-08 DIAGNOSIS — D692 Other nonthrombocytopenic purpura: Secondary | ICD-10-CM | POA: Diagnosis not present

## 2020-11-25 DIAGNOSIS — M810 Age-related osteoporosis without current pathological fracture: Secondary | ICD-10-CM | POA: Diagnosis not present

## 2020-11-25 DIAGNOSIS — S62109D Fracture of unspecified carpal bone, unspecified wrist, subsequent encounter for fracture with routine healing: Secondary | ICD-10-CM | POA: Diagnosis not present

## 2020-11-30 DIAGNOSIS — S52514D Nondisplaced fracture of right radial styloid process, subsequent encounter for closed fracture with routine healing: Secondary | ICD-10-CM | POA: Diagnosis not present

## 2020-12-03 DIAGNOSIS — Z961 Presence of intraocular lens: Secondary | ICD-10-CM | POA: Diagnosis not present

## 2020-12-03 DIAGNOSIS — H524 Presbyopia: Secondary | ICD-10-CM | POA: Diagnosis not present

## 2020-12-03 DIAGNOSIS — H401131 Primary open-angle glaucoma, bilateral, mild stage: Secondary | ICD-10-CM | POA: Diagnosis not present

## 2020-12-03 DIAGNOSIS — H52203 Unspecified astigmatism, bilateral: Secondary | ICD-10-CM | POA: Diagnosis not present

## 2021-04-04 ENCOUNTER — Other Ambulatory Visit: Payer: Self-pay

## 2021-04-04 MED ORDER — AMLODIPINE BESYLATE 5 MG PO TABS
5.0000 mg | ORAL_TABLET | Freq: Every day | ORAL | 1 refills | Status: DC
Start: 2021-04-04 — End: 2021-12-07

## 2021-05-04 ENCOUNTER — Other Ambulatory Visit: Payer: Self-pay

## 2021-05-04 ENCOUNTER — Ambulatory Visit (HOSPITAL_COMMUNITY)
Admission: RE | Admit: 2021-05-04 | Discharge: 2021-05-04 | Disposition: A | Discharge status: Home / Self Care | Payer: Medicare Other | Source: Ambulatory Visit | Attending: Cardiology | Admitting: Cardiology

## 2021-05-04 ENCOUNTER — Ambulatory Visit (HOSPITAL_COMMUNITY): Payer: Medicare Other

## 2021-05-04 DIAGNOSIS — I6523 Occlusion and stenosis of bilateral carotid arteries: Secondary | ICD-10-CM | POA: Diagnosis not present

## 2021-05-04 DIAGNOSIS — I6529 Occlusion and stenosis of unspecified carotid artery: Secondary | ICD-10-CM | POA: Insufficient documentation

## 2021-05-13 DIAGNOSIS — M810 Age-related osteoporosis without current pathological fracture: Secondary | ICD-10-CM | POA: Diagnosis not present

## 2021-05-13 DIAGNOSIS — E785 Hyperlipidemia, unspecified: Secondary | ICD-10-CM | POA: Diagnosis not present

## 2021-05-13 DIAGNOSIS — Z125 Encounter for screening for malignant neoplasm of prostate: Secondary | ICD-10-CM | POA: Diagnosis not present

## 2021-05-20 DIAGNOSIS — S62109D Fracture of unspecified carpal bone, unspecified wrist, subsequent encounter for fracture with routine healing: Secondary | ICD-10-CM | POA: Diagnosis not present

## 2021-05-20 DIAGNOSIS — I25118 Atherosclerotic heart disease of native coronary artery with other forms of angina pectoris: Secondary | ICD-10-CM | POA: Diagnosis not present

## 2021-05-20 DIAGNOSIS — L989 Disorder of the skin and subcutaneous tissue, unspecified: Secondary | ICD-10-CM | POA: Diagnosis not present

## 2021-05-20 DIAGNOSIS — I10 Essential (primary) hypertension: Secondary | ICD-10-CM | POA: Diagnosis not present

## 2021-05-20 DIAGNOSIS — D692 Other nonthrombocytopenic purpura: Secondary | ICD-10-CM | POA: Diagnosis not present

## 2021-05-20 DIAGNOSIS — N401 Enlarged prostate with lower urinary tract symptoms: Secondary | ICD-10-CM | POA: Diagnosis not present

## 2021-05-20 DIAGNOSIS — Z1331 Encounter for screening for depression: Secondary | ICD-10-CM | POA: Diagnosis not present

## 2021-05-20 DIAGNOSIS — E441 Mild protein-calorie malnutrition: Secondary | ICD-10-CM | POA: Diagnosis not present

## 2021-05-20 DIAGNOSIS — Z0000 Encounter for general adult medical examination without abnormal findings: Secondary | ICD-10-CM | POA: Diagnosis not present

## 2021-05-20 DIAGNOSIS — M810 Age-related osteoporosis without current pathological fracture: Secondary | ICD-10-CM | POA: Diagnosis not present

## 2021-05-20 DIAGNOSIS — R490 Dysphonia: Secondary | ICD-10-CM | POA: Diagnosis not present

## 2021-05-20 DIAGNOSIS — E785 Hyperlipidemia, unspecified: Secondary | ICD-10-CM | POA: Diagnosis not present

## 2021-05-20 DIAGNOSIS — I739 Peripheral vascular disease, unspecified: Secondary | ICD-10-CM | POA: Diagnosis not present

## 2021-05-22 DIAGNOSIS — Z23 Encounter for immunization: Secondary | ICD-10-CM | POA: Diagnosis not present

## 2021-05-26 DIAGNOSIS — N401 Enlarged prostate with lower urinary tract symptoms: Secondary | ICD-10-CM | POA: Diagnosis not present

## 2021-05-26 DIAGNOSIS — R351 Nocturia: Secondary | ICD-10-CM | POA: Diagnosis not present

## 2021-05-31 ENCOUNTER — Other Ambulatory Visit (HOSPITAL_COMMUNITY): Payer: Self-pay | Admitting: Cardiovascular Disease

## 2021-05-31 DIAGNOSIS — I6522 Occlusion and stenosis of left carotid artery: Secondary | ICD-10-CM

## 2021-06-11 ENCOUNTER — Other Ambulatory Visit: Payer: Self-pay | Admitting: Student

## 2021-06-11 NOTE — Progress Notes (Signed)
Patient needs detrol refill

## 2021-06-13 DIAGNOSIS — H401131 Primary open-angle glaucoma, bilateral, mild stage: Secondary | ICD-10-CM | POA: Diagnosis not present

## 2021-06-27 ENCOUNTER — Other Ambulatory Visit (HOSPITAL_COMMUNITY): Payer: Self-pay | Admitting: *Deleted

## 2021-06-27 ENCOUNTER — Other Ambulatory Visit: Payer: Self-pay

## 2021-06-27 ENCOUNTER — Other Ambulatory Visit: Payer: Self-pay | Admitting: Cardiovascular Disease

## 2021-06-27 MED ORDER — METOPROLOL TARTRATE 25 MG PO TABS: 25.0000 mg | ORAL_TABLET | Freq: Two times a day (BID) | ORAL | 1 refills | Status: DC

## 2021-06-28 ENCOUNTER — Ambulatory Visit (HOSPITAL_COMMUNITY)
Admission: RE | Admit: 2021-06-28 | Discharge: 2021-06-28 | Disposition: A | Discharge status: Home / Self Care | Payer: Medicare Other | Source: Ambulatory Visit | Attending: Internal Medicine | Admitting: Internal Medicine

## 2021-06-28 ENCOUNTER — Other Ambulatory Visit: Payer: Self-pay

## 2021-06-28 DIAGNOSIS — M810 Age-related osteoporosis without current pathological fracture: Secondary | ICD-10-CM | POA: Insufficient documentation

## 2021-06-28 MED ORDER — DENOSUMAB 60 MG/ML ~~LOC~~ SOSY: 60.0000 mg | PREFILLED_SYRINGE | Freq: Once | SUBCUTANEOUS | Status: AC

## 2021-06-28 MED ORDER — DENOSUMAB 60 MG/ML ~~LOC~~ SOSY
PREFILLED_SYRINGE | SUBCUTANEOUS | Status: AC
  Administered 2021-06-28: 60 mg via SUBCUTANEOUS
  Filled 2021-06-28: qty 1

## 2021-07-04 DIAGNOSIS — D1801 Hemangioma of skin and subcutaneous tissue: Secondary | ICD-10-CM | POA: Diagnosis not present

## 2021-07-04 DIAGNOSIS — D485 Neoplasm of uncertain behavior of skin: Secondary | ICD-10-CM | POA: Diagnosis not present

## 2021-07-04 DIAGNOSIS — L821 Other seborrheic keratosis: Secondary | ICD-10-CM | POA: Diagnosis not present

## 2021-07-04 DIAGNOSIS — L814 Other melanin hyperpigmentation: Secondary | ICD-10-CM | POA: Diagnosis not present

## 2021-07-04 DIAGNOSIS — D225 Melanocytic nevi of trunk: Secondary | ICD-10-CM | POA: Diagnosis not present

## 2021-09-25 ENCOUNTER — Other Ambulatory Visit: Payer: Self-pay | Admitting: Cardiovascular Disease

## 2021-10-04 DIAGNOSIS — D485 Neoplasm of uncertain behavior of skin: Secondary | ICD-10-CM | POA: Diagnosis not present

## 2021-10-04 DIAGNOSIS — L905 Scar conditions and fibrosis of skin: Secondary | ICD-10-CM | POA: Diagnosis not present

## 2021-10-04 DIAGNOSIS — D225 Melanocytic nevi of trunk: Secondary | ICD-10-CM | POA: Diagnosis not present

## 2021-11-03 ENCOUNTER — Ambulatory Visit (INDEPENDENT_AMBULATORY_CARE_PROVIDER_SITE_OTHER): Payer: Medicare Other | Admitting: Cardiovascular Disease

## 2021-11-03 ENCOUNTER — Encounter: Payer: Self-pay | Admitting: Cardiovascular Disease

## 2021-11-03 VITALS — BP 110/60 | HR 69 | Ht 68.0 in | Wt 151.6 lb

## 2021-11-03 DIAGNOSIS — I10 Essential (primary) hypertension: Secondary | ICD-10-CM

## 2021-11-03 DIAGNOSIS — I359 Nonrheumatic aortic valve disorder, unspecified: Secondary | ICD-10-CM

## 2021-11-03 DIAGNOSIS — I2510 Atherosclerotic heart disease of native coronary artery without angina pectoris: Secondary | ICD-10-CM

## 2021-11-03 DIAGNOSIS — I6529 Occlusion and stenosis of unspecified carotid artery: Secondary | ICD-10-CM

## 2021-11-03 DIAGNOSIS — E782 Mixed hyperlipidemia: Secondary | ICD-10-CM | POA: Diagnosis not present

## 2021-11-03 NOTE — Progress Notes (Signed)
?Cardiology Office Note:   ? ?Date:  11/03/2021  ? ?ID:  Brett [name], DOB 04/02/1941, MRN [id number] ? ?PCP:  Sueanne Margarita, DO ?  ?[place] [facility] Providers ?Cardiologist:  Sherren Mocha, MD    ? ?Referring MD: Sueanne Margarita, DO  ? ?Chief Complaint  ?Patient presents with  ? Coronary Artery Disease  ? ? ?History of Present Illness:   ? ?Brett [name] is a 81 y.o. male with a hx of coronary artery disease with remote CABG.  He then developed progressive symptomatic aortic stenosis and was treated with TAVR in 2018.  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 ? ?The patient is here with his wife today.  He is doing really well.  He has retired as a Advice worker.  They have moved in to a single level condo that they really like.  The patient has no cardiac symptoms at present.  He specifically denies chest pain, chest pressure, shortness of breath, heart palpitations, edema, orthopnea, or PND.  He has no lightheadedness or syncope.  He has been eating a lot of sweets and eats ice cream every night.  We discussed his diet at length today.  Otherwise he seems to be doing fine.  He is compliant with his medications. ? ?Past Medical History:  ?Diagnosis Date  ? Carotid stenosis   ? Elevated PSA   ? 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 Glomerulonephritis 1961  ? Hyperglycemia   ? Hyperlipidemia   ? Hyperplastic colon polyp 11/24/2009  ? x7  ? Hypertension   ? Iron deficiency anemia due to chronic blood loss 08/15/2016  ? Leukocytosis 08/02/2016  ? 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  ? PVD (peripheral vascular disease) ([place])   ? 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  ? LIMA to LAD, SVG to D2, sequential SVG to OM3-OM4, SVG to PDA, EVH via right thigh and leg  ? 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 26 mm Edwards Sapien 3 transcatheter heart valve placed via percutaneous right transfemoral  approach  ? ? ?Past Surgical History:  ?Procedure Laterality Date  ? BYPASS GRAFT  2006  ? CORONARY ARTERY BYPASS GRAFT  12/07/2004  ? CABG x5 Dr [name]  ? CYSTOSCOPY WITH LITHOLAPAXY N/A 09/25/2017  ? Procedure: CYSTOSCOPY WITH LITHOLAPAXY;  Surgeon: Cleon Gustin, MD;  [facility];  Service: Urology;  Laterality: N/A;  ? CYSTOSCOPY WITH RETROGRADE PYELOGRAM, URETEROSCOPY AND STENT PLACEMENT Left 11/02/2017  ? Procedure: CYSTOSCOPY WITH RETROGRADE PYELOGRAM, LEFT URETEROSCOPY, LEFT STENT EXCHANGE;  Surgeon: Cleon Gustin, MD;  [facility];  Service: Urology;  Laterality: Left;  ? HOLMIUM LASER APPLICATION N/A 0/26/3785  ? Procedure: HOLMIUM LASER APPLICATION;  Surgeon: Cleon Gustin, MD;  [facility];  Service: Urology;  Laterality: N/A;  ? HOLMIUM LASER APPLICATION Left 03/14/5026  ? Procedure: HOLMIUM LASER APPLICATION;  Surgeon: Cleon Gustin, MD;  [facility];  Service: Urology;  Laterality: Left;  ? HOLMIUM LASER APPLICATION Left 7/41/2878  ? Procedure: HOLMIUM LASER APPLICATION;  Surgeon: Cleon Gustin, MD;  [facility];  Service: Urology;  Laterality: Left;  ? IR URETERAL STENT LEFT NEW ACCESS W/O SEP NEPHROSTOMY CATH  10/11/2017  ? NEPHROLITHOTOMY Left 10/11/2017  ? Procedure: NEPHROLITHOTOMY PERCUTANEOUS;  Surgeon: Cleon Gustin, MD;  [facility];  Service: Urology;  Laterality: Left;  ? RIGHT HEART CATH AND CORONARY/GRAFT ANGIOGRAPHY N/A 11/21/2016  ? Procedure: Right Heart Cath and  Coronary/Graft Angiography;  Surgeon: Sherren Mocha, MD;  Location: [place] [facility];  Service: Cardiovascular;  Laterality: N/A;  ? TEE WITHOUT CARDIOVERSION N/A 12/05/2016  ? Procedure: TRANSESOPHAGEAL ECHOCARDIOGRAM (TEE);  Surgeon: Sherren Mocha, MD;  Location: [place];  Service: Open Heart Surgery;  Laterality: N/A;  ? TRANSCATHETER AORTIC VALVE REPLACEMENT, TRANSFEMORAL N/A 12/05/2016  ? Procedure: TRANSCATHETER AORTIC VALVE REPLACEMENT, TRANSFEMORAL;  Surgeon: Sherren Mocha, MD;  Location: [place];  Service: Open Heart Surgery;  Laterality: N/A;  ? ? ?Current Medications: ?Current Meds  ?Medication Sig  ? acetaminophen (TYLENOL) 325 MG tablet Take 325 mg by mouth daily.  ? acidophilus (RISAQUAD) CAPS capsule Take 1 capsule by mouth daily.  ? alfuzosin (UROXATRAL) 10 MG 24 hr tablet Take 10 mg by mouth every evening.   ? amLODipine (NORVASC) 5 MG tablet Take 1 tablet (5 mg total) by mouth daily. Pt needs to keep upcoming appt in Jan, 2023 for further refills  ? Ascorbic Acid (VITAMIN C PO) Take by mouth.  ? aspirin EC 81 MG EC tablet Take 1 tablet (81 mg total) by mouth daily.  ? atorvastatin (LIPITOR) 40 MG tablet TAKE 1 TABLET DAILY  ? Calcium Citrate-Vitamin D (CALCIUM + D PO) Take by mouth.  ? denosumab (PROLIA) 60 MG/ML SOSY injection See admin instructions.  ? finasteride (PROSCAR) 5 MG tablet Take 5 mg by mouth daily.  ? fluticasone (FLONASE) 50 MCG/ACT nasal spray USE 1 SPRAY IN EACH NOSTRILONCE DAILY  ? latanoprost (XALATAN) 0.005 % ophthalmic solution Place 1 drop into both eyes at bedtime.  ? loratadine (CLARITIN) 10 MG tablet Take 10 mg by mouth daily.  ? metoprolol tartrate (LOPRESSOR) 25 MG tablet Take 1 tablet (25 mg total) by mouth 2 (two) times daily.  ? MULTIPLE VITAMIN PO Take 1 tablet by mouth daily.  ? omeprazole (PRILOSEC) 40 MG capsule Take 40 mg by mouth every morning.  ? tolterodine (DETROL LA) 4 MG 24 hr capsule Through urology  ?  ? ?Allergies:   Levaquin [levofloxacin in d5w]  ? ?Social History  ? ?Socioeconomic History  ? Marital status: Married  ?  Spouse name: Not on file  ? Number of children: 3  ? Years of education: Not on file  ? Highest education level: Not on file  ?Occupational History  ? Occupation: Retired  ?Tobacco Use  ? Smoking status: Former  ?  Packs/day: 1.00  ?  Years: 40.00  ?  Pack years: 40.00  ?  Types: Cigarettes  ?  Quit date: 01/01/2016  ?  Years since quitting: 5.8  ? Smokeless tobacco: Never  ?Vaping Use  ? Vaping Use: Never  used  ?Substance and Sexual Activity  ? Alcohol use: No  ?  Alcohol/week: 0.0 standard drinks  ? Drug use: No  ? Sexual activity: Yes  ?Other Topics Concern  ? Not on file  ?Social History Narrative  ? Married 1988 2nd marriage. 1 child from 2nd marriage (1994). 2 children from first marriage. 3-4 grandkids-broken relationship with his son though.   ?   ? Retired from [employer] after 36 years in 1999. 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   ?   ? Hobbies: gardening, formerly bowling and pool, family time  ? ?Social Determinants of Health  ? ?Financial Resource Strain: Not on file  ?Food Insecurity: Not on file  ?Transportation Needs: Not on file  ?Physical Activity: Not on file  ?Stress: Not on file  ?Social Connections: Not  on file  ?  ? ?Family History: ?The patient's family history includes Dementia in his mother; Diabetes in his maternal grandfather; Heart attack in his father; Kidney Stones in his mother. There is no history of Colon cancer, Colon polyps, Rectal cancer, or Stomach cancer. ? ?ROS:   ?Please see the history of present illness.    ?All other systems reviewed and are negative. ? ?EKGs/Labs/Other Studies Reviewed:   ? ?The following studies were reviewed today: ?Carotid US 05/04/21: ?Summary:  ?Right Carotid: Velocities in the right ICA are consistent with a 1-39%  ?stenosis.  ?               Non-hemodynamically significant plaque <50% noted in the  ?CCA.  ? ?Left Carotid: Velocities in the left ICA are consistent with a 40-59%  ?stenosis.  ?              Non-hemodynamically significant plaque <50% noted in the  ?CCA.  ? ?Vertebrals:  Bilateral vertebral arteries demonstrate antegrade flow.  ?Subclavians: Normal flow hemodynamics were seen in bilateral subclavian  ?             arteries.  ? ?*See table(s) above for measurements and observations.  ?Suggest follow up study in 12 months.  ? ?Echo 05/06/20: ? 1. Left ventricular ejection fraction, by estimation, is 60 to 65%. The  ?left  ventricle has normal function. The left ventricle has no regional  ?wall motion abnormalities. There is mild left ventricular hypertrophy.  ?Left ventricular diastolic parameters  ?are consistent with

## 2021-11-03 NOTE — Patient Instructions (Signed)
Medication Instructions:  ?Your physician recommends that you continue on your current medications as directed. Please refer to the Current Medication list given to you today. ? ?*If you need a refill on your cardiac medications before your next appointment, please call your pharmacy* ? ? ?Lab Work: ?NONE ?If you have labs (blood work) drawn today and your tests are completely normal, you will receive your results only by: ?MyChart Message (if you have MyChart) OR ?A paper copy in the mail ?If you have any lab test that is abnormal or we need to change your treatment, we will call you to review the results. ? ? ?Testing/Procedures: ?ECHO  prior to next visit ?Your physician has requested that you have an echocardiogram. Echocardiography is a painless test that uses sound waves to create images of your heart. It provides your doctor with information about the size and shape of your heart and how well your heart?s chambers and valves are working. This procedure takes approximately one hour. There are no restrictions for this procedure. ? ?Carotid Ultrasound prior to next visit ?Your physician has requested that you have a carotid duplex. This test is an ultrasound of the carotid arteries in your neck. It looks at blood flow through these arteries that supply the brain with blood. Allow one hour for this exam. There are no restrictions or special instructions. ? ?Follow-Up: ?At [facility], you and your health needs are our priority.  As part of our continuing mission to provide you with exceptional heart care, we have created designated Provider Care Teams.  These Care Teams include your primary Cardiologist (physician) and Advanced Practice Providers (APPs -  Physician Assistants and Nurse Practitioners) who all work together to provide you with the care you need, when you need it. ? ? ?Your next appointment:   ?1 year(s) ? ?The format for your next appointment:   ?In Person ? ?Provider:   ?Sherren Mocha, MD    ? ?  ?

## 2021-11-24 DIAGNOSIS — H401131 Primary open-angle glaucoma, bilateral, mild stage: Secondary | ICD-10-CM | POA: Diagnosis not present

## 2021-11-24 DIAGNOSIS — Z961 Presence of intraocular lens: Secondary | ICD-10-CM | POA: Diagnosis not present

## 2021-11-29 ENCOUNTER — Other Ambulatory Visit: Payer: Self-pay | Admitting: Cardiovascular Disease

## 2021-11-29 DIAGNOSIS — E785 Hyperlipidemia, unspecified: Secondary | ICD-10-CM | POA: Diagnosis not present

## 2021-11-29 DIAGNOSIS — R3121 Asymptomatic microscopic hematuria: Secondary | ICD-10-CM | POA: Diagnosis not present

## 2021-11-29 DIAGNOSIS — I10 Essential (primary) hypertension: Secondary | ICD-10-CM | POA: Diagnosis not present

## 2021-11-29 DIAGNOSIS — I25118 Atherosclerotic heart disease of native coronary artery with other forms of angina pectoris: Secondary | ICD-10-CM | POA: Diagnosis not present

## 2021-11-29 DIAGNOSIS — N401 Enlarged prostate with lower urinary tract symptoms: Secondary | ICD-10-CM | POA: Diagnosis not present

## 2021-11-29 DIAGNOSIS — D692 Other nonthrombocytopenic purpura: Secondary | ICD-10-CM | POA: Diagnosis not present

## 2021-11-29 DIAGNOSIS — M810 Age-related osteoporosis without current pathological fracture: Secondary | ICD-10-CM | POA: Diagnosis not present

## 2021-11-29 DIAGNOSIS — E441 Mild protein-calorie malnutrition: Secondary | ICD-10-CM | POA: Diagnosis not present

## 2021-11-29 DIAGNOSIS — I69811 Memory deficit following other cerebrovascular disease: Secondary | ICD-10-CM | POA: Diagnosis not present

## 2021-11-29 DIAGNOSIS — J302 Other seasonal allergic rhinitis: Secondary | ICD-10-CM | POA: Diagnosis not present

## 2021-11-29 DIAGNOSIS — Z86010 Personal history of colonic polyps: Secondary | ICD-10-CM | POA: Diagnosis not present

## 2021-11-29 DIAGNOSIS — I739 Peripheral vascular disease, unspecified: Secondary | ICD-10-CM | POA: Diagnosis not present

## 2021-12-07 ENCOUNTER — Other Ambulatory Visit: Payer: Self-pay | Admitting: Cardiovascular Disease

## 2021-12-21 ENCOUNTER — Other Ambulatory Visit: Payer: Self-pay | Admitting: Cardiovascular Disease

## 2021-12-28 ENCOUNTER — Other Ambulatory Visit (HOSPITAL_COMMUNITY): Payer: Self-pay

## 2021-12-29 ENCOUNTER — Encounter (HOSPITAL_COMMUNITY)
Admission: RE | Admit: 2021-12-29 | Discharge: 2021-12-29 | Disposition: A | Discharge status: Home / Self Care | Payer: Medicare Other | Source: Ambulatory Visit | Attending: Internal Medicine | Admitting: Internal Medicine

## 2021-12-29 DIAGNOSIS — M810 Age-related osteoporosis without current pathological fracture: Secondary | ICD-10-CM | POA: Insufficient documentation

## 2021-12-29 MED ORDER — DENOSUMAB 60 MG/ML ~~LOC~~ SOSY
PREFILLED_SYRINGE | SUBCUTANEOUS | Status: AC
  Filled 2021-12-29: qty 1

## 2021-12-29 MED ORDER — DENOSUMAB 60 MG/ML ~~LOC~~ SOSY
60.0000 mg | PREFILLED_SYRINGE | Freq: Once | SUBCUTANEOUS | Status: AC
  Administered 2021-12-29: 60 mg via SUBCUTANEOUS

## 2022-04-19 ENCOUNTER — Ambulatory Visit (HOSPITAL_COMMUNITY)
Admission: RE | Admit: 2022-04-19 | Discharge: 2022-04-19 | Disposition: A | Discharge status: Home / Self Care | Payer: Medicare Other | Source: Ambulatory Visit | Attending: Cardiovascular Disease | Admitting: Cardiovascular Disease

## 2022-04-19 DIAGNOSIS — I6529 Occlusion and stenosis of unspecified carotid artery: Secondary | ICD-10-CM | POA: Insufficient documentation

## 2022-04-19 DIAGNOSIS — I6523 Occlusion and stenosis of bilateral carotid arteries: Secondary | ICD-10-CM | POA: Diagnosis not present

## 2022-04-26 ENCOUNTER — Other Ambulatory Visit (HOSPITAL_COMMUNITY): Payer: Medicare Other

## 2022-04-28 ENCOUNTER — Ambulatory Visit (HOSPITAL_COMMUNITY): Payer: Medicare Other | Attending: Cardiovascular Disease

## 2022-04-28 DIAGNOSIS — I359 Nonrheumatic aortic valve disorder, unspecified: Secondary | ICD-10-CM

## 2022-04-28 DIAGNOSIS — I2510 Atherosclerotic heart disease of native coronary artery without angina pectoris: Secondary | ICD-10-CM | POA: Diagnosis not present

## 2022-05-02 LAB — ECHOCARDIOGRAM COMPLETE
AR max vel: 1.76 cm2
AV Area VTI: 1.9 cm2
AV Area mean vel: 1.88 cm2
AV Mean grad: 5.7 mmHg
AV Peak grad: 13.2 mmHg
Ao pk vel: 1.82 m/s
Area-P 1/2: 1.94 cm2
S' Lateral: 2.5 cm

## 2022-05-10 ENCOUNTER — Telehealth: Payer: Self-pay | Admitting: Cardiovascular Disease

## 2022-05-10 NOTE — Telephone Encounter (Signed)
Returned call to patient to review ECHO, no further questions.

## 2022-05-10 NOTE — Telephone Encounter (Signed)
Follow Up:    Patient is returning Sarah's call from today, concerning his results.

## 2022-05-15 DIAGNOSIS — Z23 Encounter for immunization: Secondary | ICD-10-CM | POA: Diagnosis not present

## 2022-05-19 DIAGNOSIS — R351 Nocturia: Secondary | ICD-10-CM | POA: Diagnosis not present

## 2022-05-19 DIAGNOSIS — N401 Enlarged prostate with lower urinary tract symptoms: Secondary | ICD-10-CM | POA: Diagnosis not present

## 2022-05-25 DIAGNOSIS — I10 Essential (primary) hypertension: Secondary | ICD-10-CM | POA: Diagnosis not present

## 2022-05-25 DIAGNOSIS — E785 Hyperlipidemia, unspecified: Secondary | ICD-10-CM | POA: Diagnosis not present

## 2022-05-25 DIAGNOSIS — R7989 Other specified abnormal findings of blood chemistry: Secondary | ICD-10-CM | POA: Diagnosis not present

## 2022-05-25 DIAGNOSIS — Z125 Encounter for screening for malignant neoplasm of prostate: Secondary | ICD-10-CM | POA: Diagnosis not present

## 2022-05-28 ENCOUNTER — Other Ambulatory Visit: Payer: Self-pay | Admitting: Cardiovascular Disease

## 2022-06-01 DIAGNOSIS — I739 Peripheral vascular disease, unspecified: Secondary | ICD-10-CM | POA: Diagnosis not present

## 2022-06-01 DIAGNOSIS — E441 Mild protein-calorie malnutrition: Secondary | ICD-10-CM | POA: Diagnosis not present

## 2022-06-01 DIAGNOSIS — R3121 Asymptomatic microscopic hematuria: Secondary | ICD-10-CM | POA: Diagnosis not present

## 2022-06-01 DIAGNOSIS — E785 Hyperlipidemia, unspecified: Secondary | ICD-10-CM | POA: Diagnosis not present

## 2022-06-01 DIAGNOSIS — R490 Dysphonia: Secondary | ICD-10-CM | POA: Diagnosis not present

## 2022-06-01 DIAGNOSIS — Z0000 Encounter for general adult medical examination without abnormal findings: Secondary | ICD-10-CM | POA: Diagnosis not present

## 2022-06-01 DIAGNOSIS — Z1389 Encounter for screening for other disorder: Secondary | ICD-10-CM | POA: Diagnosis not present

## 2022-06-01 DIAGNOSIS — I25118 Atherosclerotic heart disease of native coronary artery with other forms of angina pectoris: Secondary | ICD-10-CM | POA: Diagnosis not present

## 2022-06-01 DIAGNOSIS — I69811 Memory deficit following other cerebrovascular disease: Secondary | ICD-10-CM | POA: Diagnosis not present

## 2022-06-01 DIAGNOSIS — N401 Enlarged prostate with lower urinary tract symptoms: Secondary | ICD-10-CM | POA: Diagnosis not present

## 2022-06-01 DIAGNOSIS — Z1331 Encounter for screening for depression: Secondary | ICD-10-CM | POA: Diagnosis not present

## 2022-06-01 DIAGNOSIS — I10 Essential (primary) hypertension: Secondary | ICD-10-CM | POA: Diagnosis not present

## 2022-06-01 DIAGNOSIS — R82998 Other abnormal findings in urine: Secondary | ICD-10-CM | POA: Diagnosis not present

## 2022-06-01 DIAGNOSIS — M810 Age-related osteoporosis without current pathological fracture: Secondary | ICD-10-CM | POA: Diagnosis not present

## 2022-06-01 DIAGNOSIS — D692 Other nonthrombocytopenic purpura: Secondary | ICD-10-CM | POA: Diagnosis not present

## 2022-06-21 DIAGNOSIS — H52203 Unspecified astigmatism, bilateral: Secondary | ICD-10-CM | POA: Diagnosis not present

## 2022-06-21 DIAGNOSIS — H401122 Primary open-angle glaucoma, left eye, moderate stage: Secondary | ICD-10-CM | POA: Diagnosis not present

## 2022-06-21 DIAGNOSIS — H524 Presbyopia: Secondary | ICD-10-CM | POA: Diagnosis not present

## 2022-06-21 DIAGNOSIS — Z961 Presence of intraocular lens: Secondary | ICD-10-CM | POA: Diagnosis not present

## 2022-06-28 ENCOUNTER — Other Ambulatory Visit (HOSPITAL_COMMUNITY): Payer: Self-pay | Admitting: *Deleted

## 2022-07-03 ENCOUNTER — Ambulatory Visit (HOSPITAL_COMMUNITY)
Admission: RE | Admit: 2022-07-03 | Discharge: 2022-07-03 | Disposition: A | Discharge status: Home / Self Care | Payer: Medicare Other | Source: Ambulatory Visit | Attending: Internal Medicine | Admitting: Internal Medicine

## 2022-07-03 DIAGNOSIS — M810 Age-related osteoporosis without current pathological fracture: Secondary | ICD-10-CM | POA: Insufficient documentation

## 2022-07-03 MED ORDER — DENOSUMAB 60 MG/ML ~~LOC~~ SOSY
60.0000 mg | PREFILLED_SYRINGE | Freq: Once | SUBCUTANEOUS | Status: AC
  Administered 2022-07-03: 60 mg via SUBCUTANEOUS

## 2022-07-03 MED ORDER — DENOSUMAB 60 MG/ML ~~LOC~~ SOSY
PREFILLED_SYRINGE | SUBCUTANEOUS | Status: AC
  Filled 2022-07-03: qty 1

## 2022-08-21 ENCOUNTER — Telehealth: Payer: Self-pay | Admitting: Cardiovascular Disease

## 2022-08-21 NOTE — Telephone Encounter (Signed)
Wife would like a call back regarding patient getting annual Carotid and Echo tests

## 2022-08-24 NOTE — Telephone Encounter (Signed)
Returned call to wife Butch Penny to inform her that ECHO and Carotid US have already been completed in September and next appt with [facility] has been scheduled for 10/06/22 '@10'$ :00.

## 2022-09-21 DIAGNOSIS — D692 Other nonthrombocytopenic purpura: Secondary | ICD-10-CM | POA: Diagnosis not present

## 2022-09-21 DIAGNOSIS — I10 Essential (primary) hypertension: Secondary | ICD-10-CM | POA: Diagnosis not present

## 2022-09-21 DIAGNOSIS — R490 Dysphonia: Secondary | ICD-10-CM | POA: Diagnosis not present

## 2022-09-21 DIAGNOSIS — Z0000 Encounter for general adult medical examination without abnormal findings: Secondary | ICD-10-CM | POA: Diagnosis not present

## 2022-09-21 DIAGNOSIS — D649 Anemia, unspecified: Secondary | ICD-10-CM | POA: Diagnosis not present

## 2022-09-21 DIAGNOSIS — N401 Enlarged prostate with lower urinary tract symptoms: Secondary | ICD-10-CM | POA: Diagnosis not present

## 2022-09-21 DIAGNOSIS — M810 Age-related osteoporosis without current pathological fracture: Secondary | ICD-10-CM | POA: Diagnosis not present

## 2022-09-21 DIAGNOSIS — E538 Deficiency of other specified B group vitamins: Secondary | ICD-10-CM | POA: Diagnosis not present

## 2022-09-21 DIAGNOSIS — L989 Disorder of the skin and subcutaneous tissue, unspecified: Secondary | ICD-10-CM | POA: Diagnosis not present

## 2022-09-21 DIAGNOSIS — E441 Mild protein-calorie malnutrition: Secondary | ICD-10-CM | POA: Diagnosis not present

## 2022-09-21 DIAGNOSIS — F03A0 Unspecified dementia, mild, without behavioral disturbance, psychotic disturbance, mood disturbance, and anxiety: Secondary | ICD-10-CM | POA: Diagnosis not present

## 2022-09-21 DIAGNOSIS — I25118 Atherosclerotic heart disease of native coronary artery with other forms of angina pectoris: Secondary | ICD-10-CM | POA: Diagnosis not present

## 2022-09-21 DIAGNOSIS — I739 Peripheral vascular disease, unspecified: Secondary | ICD-10-CM | POA: Diagnosis not present

## 2022-09-21 DIAGNOSIS — E785 Hyperlipidemia, unspecified: Secondary | ICD-10-CM | POA: Diagnosis not present

## 2022-10-06 ENCOUNTER — Ambulatory Visit: Payer: Medicare Other | Attending: Cardiovascular Disease | Admitting: Cardiovascular Disease

## 2022-10-06 ENCOUNTER — Encounter: Payer: Self-pay | Admitting: Cardiovascular Disease

## 2022-10-06 VITALS — BP 106/54 | HR 70 | Ht 66.5 in | Wt 147.2 lb

## 2022-10-06 DIAGNOSIS — I2510 Atherosclerotic heart disease of native coronary artery without angina pectoris: Secondary | ICD-10-CM | POA: Diagnosis not present

## 2022-10-06 DIAGNOSIS — I10 Essential (primary) hypertension: Secondary | ICD-10-CM | POA: Insufficient documentation

## 2022-10-06 DIAGNOSIS — E782 Mixed hyperlipidemia: Secondary | ICD-10-CM | POA: Diagnosis not present

## 2022-10-06 DIAGNOSIS — I359 Nonrheumatic aortic valve disorder, unspecified: Secondary | ICD-10-CM | POA: Insufficient documentation

## 2022-10-06 NOTE — Progress Notes (Unsigned)
Cardiology Office Note:    Date:  10/06/2022   ID:  Brett [name], DOB [birth year], MRN [id number]  PCP:  Sueanne Margarita, [facility] Cardiologist:  Sherren Mocha, MD     Referring MD: Sueanne Margarita, DO   Chief Complaint  Patient presents with   Coronary Artery Disease    History of Present Illness:    Brett [name] is a 82 y.o. male with a hx of coronary artery disease with remote CABG.  He then developed progressive symptomatic aortic stenosis and was treated with TAVR in 2018.  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   The patient is here with his wife today.  He has been doing fine from a cardiac standpoint.  He denies chest pain, chest pressure, shortness of breath, heart palpitations, or lightheadedness.  Has had no leg edema, orthopnea, or PND.  His wife notes that he has slowed down some over the past year.  He has been diagnosed with dementia and is now taking Aricept.  He has been able to remain functionally independent today.  He has not been interested in walking or exercising.  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   26 mm Edwards Sapien 3 transcatheter heart valve placed via percutaneous right transfemoral approach    Past Surgical History:  Procedure Laterality Date   BYPASS GRAFT  2006    CORONARY ARTERY BYPASS GRAFT  12/07/2004   CABG x5 Dr [name]   CYSTOSCOPY WITH LITHOLAPAXY N/A 09/25/2017   Procedure: CYSTOSCOPY WITH LITHOLAPAXY;  Surgeon: Cleon Gustin, MD;  Location: [facility];  Service: Urology;  Laterality: N/A;   CYSTOSCOPY WITH RETROGRADE PYELOGRAM, URETEROSCOPY AND STENT PLACEMENT Left 11/02/2017   Procedure: CYSTOSCOPY WITH RETROGRADE PYELOGRAM, LEFT URETEROSCOPY, LEFT STENT EXCHANGE;  Surgeon: Cleon Gustin, MD;  Location: [facility];  Service: Urology;  Laterality: Left;   HOLMIUM LASER APPLICATION N/A [id number]   Procedure: HOLMIUM LASER APPLICATION;  Surgeon: Cleon Gustin, MD;  Location: [facility];  Service: Urology;  Laterality: N/A;   HOLMIUM LASER APPLICATION Left [id number]   Procedure: HOLMIUM LASER APPLICATION;  Surgeon: Cleon Gustin, MD;  Location: [facility];  Service: Urology;  Laterality: Left;   HOLMIUM LASER APPLICATION Left [id number]   Procedure: HOLMIUM LASER APPLICATION;  Surgeon: Cleon Gustin, MD;  Location: [facility];  Service: Urology;  Laterality: Left;   IR URETERAL STENT LEFT NEW ACCESS W/O SEP NEPHROSTOMY CATH  10/11/2017   NEPHROLITHOTOMY Left 10/11/2017   Procedure: NEPHROLITHOTOMY PERCUTANEOUS;  Surgeon: Cleon Gustin, MD;  Location: [facility];  Service: Urology;  Laterality: Left;   RIGHT HEART CATH AND CORONARY/GRAFT ANGIOGRAPHY N/A 11/21/2016   Procedure: Right Heart Cath and Coronary/Graft Angiography;  Surgeon: Sherren Mocha, MD;  Location: [facility];  Service: Cardiovascular;  Laterality: N/A;  TEE WITHOUT CARDIOVERSION N/A 12/05/2016   Procedure: TRANSESOPHAGEAL ECHOCARDIOGRAM (TEE);  Surgeon: Sherren Mocha, MD;  Location: [facility];  Service: Open Heart Surgery;  Laterality: N/A;   TRANSCATHETER AORTIC VALVE REPLACEMENT, TRANSFEMORAL N/A 12/05/2016   Procedure: TRANSCATHETER AORTIC VALVE REPLACEMENT, TRANSFEMORAL;  Surgeon: Sherren Mocha, MD;  Location: [facility];  Service: Open Heart Surgery;  Laterality: N/A;     Current Medications: Current Meds  Medication Sig   acetaminophen (TYLENOL) 325 MG tablet Take 325 mg by mouth daily.   acidophilus (RISAQUAD) CAPS capsule Take 1 capsule by mouth daily.   alfuzosin (UROXATRAL) 10 MG 24 hr tablet Take 10 mg by mouth every evening.    amLODipine (NORVASC) 5 MG tablet TAKE 1 TABLET DAILY (Patient taking differently: Patient is taking 2.5 mg due to patient having to take Aricept.)   Ascorbic Acid (VITAMIN C PO) Take by mouth.   aspirin EC 81 MG EC tablet Take 1 tablet (81 mg total) by mouth daily.   atorvastatin (LIPITOR) 40 MG tablet TAKE 1 TABLET DAILY (Patient taking differently: Take 80 mg by mouth daily.)   Calcium Citrate-Vitamin D (CALCIUM + D PO) Take by mouth.   denosumab (PROLIA) 60 MG/ML SOSY injection See admin instructions.   donepezil (ARICEPT) 5 MG tablet Take 5 mg by mouth at bedtime.   finasteride (PROSCAR) 5 MG tablet Take 5 mg by mouth daily.   fluticasone (FLONASE) 50 MCG/ACT nasal spray USE 1 SPRAY IN EACH NOSTRILONCE DAILY   latanoprost (XALATAN) 0.005 % ophthalmic solution Place 1 drop into both eyes at bedtime.   loratadine (CLARITIN) 10 MG tablet Take 10 mg by mouth daily.   metoprolol tartrate (LOPRESSOR) 25 MG tablet TAKE 1 TABLET TWICE A DAY   MULTIPLE VITAMIN PO Take 1 tablet by mouth daily.   omeprazole (PRILOSEC) 40 MG capsule Take 40 mg by mouth every morning.   tolterodine (DETROL LA) 4 MG 24 hr capsule Through urology     Allergies:   Levaquin [levofloxacin in d5w]   Social History   Socioeconomic History   Marital status: Married    Spouse name: Not on file   Number of children: 3   Years of education: Not on file   Highest education level: Not on file  Occupational History   Occupation: Retired  Tobacco Use   Smoking status: Former    Packs/day: 1.00    Years: 40.00    Total pack years: 40.00    Types: Cigarettes    Quit date: 01/01/2016    Years since quitting: 6.7   Smokeless tobacco: Never  Vaping  Use   Vaping Use: Never used  Substance and Sexual Activity   Alcohol use: No    Alcohol/week: 0.0 standard drinks of alcohol   Drug use: No   Sexual activity: Yes  Other Topics Concern   Not on file  Social History Narrative   Married 1988 2nd marriage. 1 child from 2nd marriage (1994). 2 children from first marriage. 3-4 grandkids-broken relationship with his son though.       Retired from [employer] after 36 years in 1999. 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       Hobbies: gardening, formerly Environmental consultant and pool, family time   Social Determinants of Radio broadcast assistant Strain: Not on file  Food Insecurity: Not on file  Transportation Needs: Not on file  Physical Activity: Not on file  Stress: Not on file  Social Connections: Not on file  Family History: The patient's family history includes Dementia in his mother; Diabetes in his maternal grandfather; Heart attack in his father; Kidney Stones in his mother. There is no history of Colon cancer, Colon polyps, Rectal cancer, or Stomach cancer.  ROS:   Please see the history of present illness.    All other systems reviewed and are negative.  EKGs/Labs/Other Studies Reviewed:    The following studies were reviewed today: Echo 04/28/2022:  1. Left ventricular ejection fraction, by estimation, is 60 to 65%. The  left ventricle has normal function. The left ventricle has no regional  wall motion abnormalities. There is mild concentric left ventricular  hypertrophy. Left ventricular diastolic  parameters are consistent with Grade II diastolic dysfunction  (pseudonormalization).   2. Right ventricular systolic function is normal. The right ventricular  size is normal. There is normal pulmonary artery systolic pressure.   3. Left atrial size was moderately dilated.   4. Right atrial size was mildly dilated.   5. The mitral valve is degenerative. Trivial mitral valve regurgitation.  No evidence of  mitral stenosis. Moderate mitral annular calcification.   6. The aortic valve has been repaired/replaced. Aortic valve  regurgitation is not visualized. No aortic stenosis is present. There is a  26 mm Sapien prosthetic (TAVR) valve present in the aortic position. Echo  findings are consistent with normal  structure and function of the aortic valve prosthesis. Aortic valve area,  by VTI measures 1.90 cm. Aortic valve mean gradient measures 5.7 mmHg.  Aortic valve Vmax measures 1.82 m/s.   7. The inferior vena cava is normal in size with greater than 50%  respiratory variability, suggesting right atrial pressure of 3 mmHg.   Comparison(s): No significant change from prior study.   Conclusion(s)/Recommendation(s): Stable TAVR.   EKG:  EKG is ordered today.  The ekg ordered today demonstrates NSR 70 bpm, occasional PVC  Recent Labs: No results found for requested labs within last 365 days.  Recent Lipid Panel    Component Value Date/Time   CHOL 132 08/23/2018 1215   TRIG 100.0 08/23/2018 1215   TRIG 93 06/25/2006 0901   HDL 42.60 08/23/2018 1215   CHOLHDL 3 08/23/2018 1215   VLDL 20.0 08/23/2018 1215   LDLCALC 69 08/23/2018 1215   LDLDIRECT 65.0 07/20/2017 1058     Risk Assessment/Calculations:                Physical Exam:    VS:  BP (!) 106/54   Pulse 70   Ht 5' 6.5" (1.689 m)   Wt 147 lb 3.2 oz (66.8 kg)   SpO2 96%   BMI 23.40 kg/m     Wt Readings from Last 3 Encounters:  10/06/22 147 lb 3.2 oz (66.8 kg)  11/03/21 151 lb 9.6 oz (68.8 kg)  05/10/20 146 lb 12.8 oz (66.6 kg)     GEN:  Well nourished, well developed in no acute distress HEENT: Normal NECK: No JVD; No carotid bruits LYMPHATICS: No lymphadenopathy CARDIAC: RRR, no murmurs, rubs, gallops RESPIRATORY:  Clear to auscultation without rales, wheezing or rhonchi  ABDOMEN: Soft, non-tender, non-distended MUSCULOSKELETAL:  No edema; No deformity  SKIN: Warm and dry NEUROLOGIC:  Alert and oriented x  3 PSYCHIATRIC:  Normal affect   ASSESSMENT:    1. Coronary artery disease involving native coronary artery of native heart without angina pectoris   2. Aortic valve disorder   3. Mixed hyperlipidemia   4. Essential hypertension    PLAN:  In order of problems listed above:  Doing well with no anginal symptoms. Continue ASA, beta blocker, statin drug. No change in therapy today. Follow-up one year.  Normal TAVR bioprosthetic function with a mean gradient of 6 mmHg and no paravalvular regurgitation.  Recommend follow-up in 1 year with an echocardiogram prior to his visit. Treated with atorvastatin.  Labs followed through his PCP.  Continue current therapy. Blood pressure well-controlled on amlodipine and metoprolol tartrate.  Continue current management.  Reviewed recent labs with a creatinine of 1.6.  Advised to drink plenty of fluids.           Medication Adjustments/Labs and Tests Ordered: Current medicines are reviewed at length with the patient today.  Concerns regarding medicines are outlined above.  No orders of the defined types were placed in this encounter.  No orders of the defined types were placed in this encounter.   There are no Patient Instructions on file for this visit.   Signed, Sherren Mocha, MD  10/06/2022 10:21 AM    [place]

## 2022-10-06 NOTE — Patient Instructions (Signed)
Medication Instructions:  Your physician recommends that you continue on your current medications as directed. Please refer to the Current Medication list given to you today.  *If you need a refill on your cardiac medications before your next appointment, please call your pharmacy*   Lab Work: NONE If you have labs (blood work) drawn today and your tests are completely normal, you will receive your results only by: [place] (if you have MyChart) OR A paper copy in the mail If you have any lab test that is abnormal or we need to change your treatment, we will call you to review the results.   Testing/Procedures: ECHO (in 1 year, prior to visit) Your physician recommends that you continue on your current medications as directed. Please refer to the Current Medication list given to you today.  Follow-Up: At [facility], you and your health needs are our priority.  As part of our continuing mission to provide you with exceptional heart care, we have created designated Provider Care Teams.  These Care Teams include your primary Cardiologist (physician) and Advanced Practice Providers (APPs -  Physician Assistants and Nurse Practitioners) who all work together to provide you with the care you need, when you need it.  Your next appointment:   1 year(s)  Provider:   Sherren Mocha, MD

## 2022-11-13 DIAGNOSIS — R351 Nocturia: Secondary | ICD-10-CM | POA: Diagnosis not present

## 2022-11-13 DIAGNOSIS — N401 Enlarged prostate with lower urinary tract symptoms: Secondary | ICD-10-CM | POA: Diagnosis not present

## 2022-11-25 ENCOUNTER — Other Ambulatory Visit: Payer: Self-pay | Admitting: Cardiovascular Disease

## 2022-12-05 DIAGNOSIS — F03A0 Unspecified dementia, mild, without behavioral disturbance, psychotic disturbance, mood disturbance, and anxiety: Secondary | ICD-10-CM | POA: Diagnosis not present

## 2022-12-05 DIAGNOSIS — D692 Other nonthrombocytopenic purpura: Secondary | ICD-10-CM | POA: Diagnosis not present

## 2022-12-05 DIAGNOSIS — R7301 Impaired fasting glucose: Secondary | ICD-10-CM | POA: Diagnosis not present

## 2022-12-05 DIAGNOSIS — S62109D Fracture of unspecified carpal bone, unspecified wrist, subsequent encounter for fracture with routine healing: Secondary | ICD-10-CM | POA: Diagnosis not present

## 2022-12-05 DIAGNOSIS — I25118 Atherosclerotic heart disease of native coronary artery with other forms of angina pectoris: Secondary | ICD-10-CM | POA: Diagnosis not present

## 2022-12-05 DIAGNOSIS — I10 Essential (primary) hypertension: Secondary | ICD-10-CM | POA: Diagnosis not present

## 2022-12-05 DIAGNOSIS — M810 Age-related osteoporosis without current pathological fracture: Secondary | ICD-10-CM | POA: Diagnosis not present

## 2022-12-05 DIAGNOSIS — I69811 Memory deficit following other cerebrovascular disease: Secondary | ICD-10-CM | POA: Diagnosis not present

## 2022-12-05 DIAGNOSIS — R490 Dysphonia: Secondary | ICD-10-CM | POA: Diagnosis not present

## 2022-12-05 DIAGNOSIS — E785 Hyperlipidemia, unspecified: Secondary | ICD-10-CM | POA: Diagnosis not present

## 2022-12-05 DIAGNOSIS — E441 Mild protein-calorie malnutrition: Secondary | ICD-10-CM | POA: Diagnosis not present

## 2022-12-05 DIAGNOSIS — D649 Anemia, unspecified: Secondary | ICD-10-CM | POA: Diagnosis not present

## 2022-12-06 ENCOUNTER — Ambulatory Visit: Payer: Medicare Other | Admitting: Cardiovascular Disease

## 2022-12-28 ENCOUNTER — Other Ambulatory Visit (HOSPITAL_COMMUNITY): Payer: Self-pay

## 2023-01-02 ENCOUNTER — Encounter (HOSPITAL_COMMUNITY): Payer: Medicare Other

## 2023-01-08 ENCOUNTER — Encounter (HOSPITAL_COMMUNITY)
Admission: RE | Admit: 2023-01-08 | Discharge: 2023-01-08 | Disposition: A | Discharge status: Home / Self Care | Payer: Medicare Other | Source: Ambulatory Visit | Attending: Internal Medicine | Admitting: Internal Medicine

## 2023-01-08 ENCOUNTER — Telehealth: Payer: Self-pay | Admitting: Cardiovascular Disease

## 2023-01-08 DIAGNOSIS — M810 Age-related osteoporosis without current pathological fracture: Secondary | ICD-10-CM | POA: Insufficient documentation

## 2023-01-08 MED ORDER — DENOSUMAB 60 MG/ML ~~LOC~~ SOSY
PREFILLED_SYRINGE | SUBCUTANEOUS | Status: AC
  Filled 2023-01-08: qty 1

## 2023-01-08 MED ORDER — METOPROLOL TARTRATE 25 MG PO TABS: 25.0000 mg | ORAL_TABLET | Freq: Two times a day (BID) | ORAL | 3 refills | Status: DC

## 2023-01-08 MED ORDER — DENOSUMAB 60 MG/ML ~~LOC~~ SOSY
60.0000 mg | PREFILLED_SYRINGE | Freq: Once | SUBCUTANEOUS | Status: AC
  Administered 2023-01-08: 60 mg via SUBCUTANEOUS

## 2023-01-08 NOTE — Telephone Encounter (Signed)
*  STAT* If patient is at the pharmacy, call can be transferred to refill team.   1. Which medications need to be refilled? (please list name of each medication and dose if known) Metoprolol- need called in to his local pharmacy  2. Which pharmacy/location (including street and city if local pharmacy) is medication to be sent to?[facility] [address], [place]  3. Do they need a 30 day or 90 day supply? 90 days-please call in asap,completely out of it

## 2023-01-08 NOTE — Telephone Encounter (Signed)
Pt's medication was sent to pt's pharmacy as requested. Confirmation received.  °

## 2023-01-11 DIAGNOSIS — M810 Age-related osteoporosis without current pathological fracture: Secondary | ICD-10-CM | POA: Diagnosis not present

## 2023-04-30 DIAGNOSIS — R5383 Other fatigue: Secondary | ICD-10-CM | POA: Diagnosis not present

## 2023-04-30 DIAGNOSIS — F03A0 Unspecified dementia, mild, without behavioral disturbance, psychotic disturbance, mood disturbance, and anxiety: Secondary | ICD-10-CM | POA: Diagnosis not present

## 2023-04-30 DIAGNOSIS — J302 Other seasonal allergic rhinitis: Secondary | ICD-10-CM | POA: Diagnosis not present

## 2023-04-30 DIAGNOSIS — Z20822 Contact with and (suspected) exposure to covid-19: Secondary | ICD-10-CM | POA: Diagnosis not present

## 2023-04-30 DIAGNOSIS — Z1152 Encounter for screening for COVID-19: Secondary | ICD-10-CM | POA: Diagnosis not present

## 2023-04-30 DIAGNOSIS — J209 Acute bronchitis, unspecified: Secondary | ICD-10-CM | POA: Diagnosis not present

## 2023-04-30 DIAGNOSIS — R0981 Nasal congestion: Secondary | ICD-10-CM | POA: Diagnosis not present

## 2023-04-30 DIAGNOSIS — D72825 Bandemia: Secondary | ICD-10-CM | POA: Diagnosis not present

## 2023-04-30 DIAGNOSIS — R051 Acute cough: Secondary | ICD-10-CM | POA: Diagnosis not present

## 2023-05-17 DIAGNOSIS — R351 Nocturia: Secondary | ICD-10-CM | POA: Diagnosis not present

## 2023-05-17 DIAGNOSIS — N401 Enlarged prostate with lower urinary tract symptoms: Secondary | ICD-10-CM | POA: Diagnosis not present

## 2023-05-17 DIAGNOSIS — Z23 Encounter for immunization: Secondary | ICD-10-CM | POA: Diagnosis not present

## 2023-05-31 DIAGNOSIS — M810 Age-related osteoporosis without current pathological fracture: Secondary | ICD-10-CM | POA: Diagnosis not present

## 2023-05-31 DIAGNOSIS — R7301 Impaired fasting glucose: Secondary | ICD-10-CM | POA: Diagnosis not present

## 2023-05-31 DIAGNOSIS — Z0001 Encounter for general adult medical examination with abnormal findings: Secondary | ICD-10-CM | POA: Diagnosis not present

## 2023-05-31 DIAGNOSIS — D649 Anemia, unspecified: Secondary | ICD-10-CM | POA: Diagnosis not present

## 2023-05-31 DIAGNOSIS — I10 Essential (primary) hypertension: Secondary | ICD-10-CM | POA: Diagnosis not present

## 2023-05-31 DIAGNOSIS — E785 Hyperlipidemia, unspecified: Secondary | ICD-10-CM | POA: Diagnosis not present

## 2023-05-31 DIAGNOSIS — Z1389 Encounter for screening for other disorder: Secondary | ICD-10-CM | POA: Diagnosis not present

## 2023-06-05 DIAGNOSIS — Z0000 Encounter for general adult medical examination without abnormal findings: Secondary | ICD-10-CM | POA: Diagnosis not present

## 2023-06-05 DIAGNOSIS — I739 Peripheral vascular disease, unspecified: Secondary | ICD-10-CM | POA: Diagnosis not present

## 2023-06-05 DIAGNOSIS — I10 Essential (primary) hypertension: Secondary | ICD-10-CM | POA: Diagnosis not present

## 2023-06-05 DIAGNOSIS — I7102 Dissection of abdominal aorta: Secondary | ICD-10-CM | POA: Diagnosis not present

## 2023-06-05 DIAGNOSIS — R82998 Other abnormal findings in urine: Secondary | ICD-10-CM | POA: Diagnosis not present

## 2023-06-05 DIAGNOSIS — I69811 Memory deficit following other cerebrovascular disease: Secondary | ICD-10-CM | POA: Diagnosis not present

## 2023-06-05 DIAGNOSIS — R3121 Asymptomatic microscopic hematuria: Secondary | ICD-10-CM | POA: Diagnosis not present

## 2023-06-05 DIAGNOSIS — M810 Age-related osteoporosis without current pathological fracture: Secondary | ICD-10-CM | POA: Diagnosis not present

## 2023-06-05 DIAGNOSIS — Z1331 Encounter for screening for depression: Secondary | ICD-10-CM | POA: Diagnosis not present

## 2023-06-05 DIAGNOSIS — F03A0 Unspecified dementia, mild, without behavioral disturbance, psychotic disturbance, mood disturbance, and anxiety: Secondary | ICD-10-CM | POA: Diagnosis not present

## 2023-06-05 DIAGNOSIS — D692 Other nonthrombocytopenic purpura: Secondary | ICD-10-CM | POA: Diagnosis not present

## 2023-06-05 DIAGNOSIS — N401 Enlarged prostate with lower urinary tract symptoms: Secondary | ICD-10-CM | POA: Diagnosis not present

## 2023-06-05 DIAGNOSIS — I25118 Atherosclerotic heart disease of native coronary artery with other forms of angina pectoris: Secondary | ICD-10-CM | POA: Diagnosis not present

## 2023-06-05 DIAGNOSIS — E785 Hyperlipidemia, unspecified: Secondary | ICD-10-CM | POA: Diagnosis not present

## 2023-07-10 ENCOUNTER — Other Ambulatory Visit (HOSPITAL_COMMUNITY): Payer: Self-pay | Admitting: *Deleted

## 2023-07-11 ENCOUNTER — Ambulatory Visit (HOSPITAL_COMMUNITY)
Admission: RE | Admit: 2023-07-11 | Discharge: 2023-07-11 | Disposition: A | Discharge status: Home / Self Care | Payer: Medicare Other | Source: Ambulatory Visit | Attending: Internal Medicine | Admitting: Internal Medicine

## 2023-07-11 ENCOUNTER — Encounter: Payer: Self-pay | Admitting: Hematology & Oncology

## 2023-07-11 DIAGNOSIS — Z79620 Long term (current) use of immunosuppressive biologic: Secondary | ICD-10-CM | POA: Diagnosis not present

## 2023-07-11 DIAGNOSIS — M810 Age-related osteoporosis without current pathological fracture: Secondary | ICD-10-CM | POA: Diagnosis not present

## 2023-07-11 MED ORDER — DENOSUMAB 60 MG/ML ~~LOC~~ SOSY
60.0000 mg | PREFILLED_SYRINGE | Freq: Once | SUBCUTANEOUS | Status: AC
  Administered 2023-07-11: 60 mg via SUBCUTANEOUS

## 2023-07-11 MED ORDER — DENOSUMAB 60 MG/ML ~~LOC~~ SOSY
PREFILLED_SYRINGE | SUBCUTANEOUS | Status: AC
  Filled 2023-07-11: qty 1

## 2023-07-13 DIAGNOSIS — N401 Enlarged prostate with lower urinary tract symptoms: Secondary | ICD-10-CM | POA: Diagnosis not present

## 2023-07-13 DIAGNOSIS — I69811 Memory deficit following other cerebrovascular disease: Secondary | ICD-10-CM | POA: Diagnosis not present

## 2023-07-13 DIAGNOSIS — D649 Anemia, unspecified: Secondary | ICD-10-CM | POA: Diagnosis not present

## 2023-07-13 DIAGNOSIS — E785 Hyperlipidemia, unspecified: Secondary | ICD-10-CM | POA: Diagnosis not present

## 2023-07-13 DIAGNOSIS — F03A0 Unspecified dementia, mild, without behavioral disturbance, psychotic disturbance, mood disturbance, and anxiety: Secondary | ICD-10-CM | POA: Diagnosis not present

## 2023-07-13 DIAGNOSIS — I10 Essential (primary) hypertension: Secondary | ICD-10-CM | POA: Diagnosis not present

## 2023-07-13 DIAGNOSIS — I7102 Dissection of abdominal aorta: Secondary | ICD-10-CM | POA: Diagnosis not present

## 2023-07-13 DIAGNOSIS — E441 Mild protein-calorie malnutrition: Secondary | ICD-10-CM | POA: Diagnosis not present

## 2023-07-13 DIAGNOSIS — M810 Age-related osteoporosis without current pathological fracture: Secondary | ICD-10-CM | POA: Diagnosis not present

## 2023-07-13 DIAGNOSIS — I25118 Atherosclerotic heart disease of native coronary artery with other forms of angina pectoris: Secondary | ICD-10-CM | POA: Diagnosis not present

## 2023-07-13 DIAGNOSIS — D692 Other nonthrombocytopenic purpura: Secondary | ICD-10-CM | POA: Diagnosis not present

## 2023-07-13 DIAGNOSIS — I739 Peripheral vascular disease, unspecified: Secondary | ICD-10-CM | POA: Diagnosis not present

## 2023-07-16 ENCOUNTER — Other Ambulatory Visit: Payer: Self-pay | Admitting: Internal Medicine

## 2023-07-16 DIAGNOSIS — F03A0 Unspecified dementia, mild, without behavioral disturbance, psychotic disturbance, mood disturbance, and anxiety: Secondary | ICD-10-CM

## 2023-08-09 ENCOUNTER — Ambulatory Visit
Admission: RE | Admit: 2023-08-09 | Discharge: 2023-08-09 | Disposition: A | Discharge status: Home / Self Care | Payer: Medicare Other | Source: Ambulatory Visit | Attending: Internal Medicine | Admitting: Internal Medicine

## 2023-08-09 DIAGNOSIS — F03A0 Unspecified dementia, mild, without behavioral disturbance, psychotic disturbance, mood disturbance, and anxiety: Secondary | ICD-10-CM

## 2023-08-09 DIAGNOSIS — F0390 Unspecified dementia without behavioral disturbance: Secondary | ICD-10-CM | POA: Diagnosis not present

## 2023-08-09 MED ORDER — GADOPICLENOL 0.5 MMOL/ML IV SOLN
7.0000 mL | Freq: Once | INTRAVENOUS | Status: AC | PRN
  Administered 2023-08-09: 7 mL via INTRAVENOUS

## 2023-08-21 ENCOUNTER — Encounter: Payer: Self-pay | Admitting: Hematology & Oncology

## 2023-09-10 ENCOUNTER — Ambulatory Visit (HOSPITAL_COMMUNITY): Payer: Medicare Other | Attending: Cardiovascular Disease

## 2023-09-12 ENCOUNTER — Ambulatory Visit (HOSPITAL_COMMUNITY): Payer: Medicare Other | Attending: Internal Medicine

## 2023-09-12 DIAGNOSIS — I10 Essential (primary) hypertension: Secondary | ICD-10-CM | POA: Insufficient documentation

## 2023-09-12 DIAGNOSIS — E782 Mixed hyperlipidemia: Secondary | ICD-10-CM | POA: Insufficient documentation

## 2023-09-12 DIAGNOSIS — I359 Nonrheumatic aortic valve disorder, unspecified: Secondary | ICD-10-CM | POA: Diagnosis not present

## 2023-09-12 DIAGNOSIS — I2510 Atherosclerotic heart disease of native coronary artery without angina pectoris: Secondary | ICD-10-CM | POA: Diagnosis not present

## 2023-09-12 LAB — ECHOCARDIOGRAM COMPLETE
AR max vel: 2.19 cm2
AV Area VTI: 2.47 cm2
AV Area mean vel: 2.33 cm2
AV Mean grad: 6.7 mm[Hg]
AV Peak grad: 13.1 mm[Hg]
Ao pk vel: 1.81 m/s
Area-P 1/2: 1.96 cm2
S' Lateral: 2.4 cm

## 2023-10-10 ENCOUNTER — Ambulatory Visit (INDEPENDENT_AMBULATORY_CARE_PROVIDER_SITE_OTHER): Payer: Self-pay | Admitting: Neurology

## 2023-10-10 ENCOUNTER — Encounter: Payer: Self-pay | Admitting: Neurology

## 2023-10-10 VITALS — BP 110/56 | HR 60 | Ht 66.0 in | Wt 144.0 lb

## 2023-10-10 DIAGNOSIS — F02A0 Dementia in other diseases classified elsewhere, mild, without behavioral disturbance, psychotic disturbance, mood disturbance, and anxiety: Secondary | ICD-10-CM

## 2023-10-10 DIAGNOSIS — F05 Delirium due to known physiological condition: Secondary | ICD-10-CM | POA: Diagnosis not present

## 2023-10-10 DIAGNOSIS — R4189 Other symptoms and signs involving cognitive functions and awareness: Secondary | ICD-10-CM | POA: Diagnosis not present

## 2023-10-10 DIAGNOSIS — G319 Degenerative disease of nervous system, unspecified: Secondary | ICD-10-CM | POA: Diagnosis not present

## 2023-10-10 DIAGNOSIS — G301 Alzheimer's disease with late onset: Secondary | ICD-10-CM

## 2023-10-10 DIAGNOSIS — F03918 Unspecified dementia, unspecified severity, with other behavioral disturbance: Secondary | ICD-10-CM

## 2023-10-10 NOTE — Progress Notes (Addendum)
 [facility]    Provider:  Melvyn Novas, MD  Primary Care Physician:  Charlane Ferretti, DO [address] [place] [address]     Referring Provider: Charlane Ferretti, Do [address] [place],  [place] [address]          Chief Complaint according to patient   Patient presents with:     New Patient (Initial Visit)           HISTORY OF PRESENT ILLNESS:  Brett [name] is a 83 y.o. male patient who is seen upon Dr Thornell Mule, DO, referral on 10/10/2023 for a memory evaluation.    Chief concern according to patient :  " when my wife asked me to do something , I forget- she comes home and asks about my day and I have forgotten what I was supposed to do and what I actually did ".  His wife answered that he doesn't know if he a meal and what kind of food, whom he met, who called on him. He can watch the news and seems not to absorb the information.  Going on over the last  9 years, and first I chalked it up to forgetfulness, to open heart surgery ( ByPASS  2001- 5 vessels )  and  aortic stenosis  with Valve replacement ( TAVR 2019) ,  kidney stone surgery- perioperative massive  blood loss, complications of surgery, and  he had been reluctant to accept hearing aids.    I have the pleasure of seeing Brett [name]  on 10/10/23 a right -handed male with a memory concern.      Family medical /sleep history:  mother with PICK's disease, age 44 and moved to a memory unit.  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   and lives in a household with spouse the only daughter lives in [place],  [place] adult son from his previous marriage.  Tobacco use:  smoker from age 8 until age 6.   ETOH use :  Caffeine intake in form of Coffee( 2 cups ) Soda( coke  1 a day ) Tea ( .) no energy drinks Exercise in form of : no regular activity. .   Hobbies : watching TV,  sleeping much more.       Sleep  habits are as follows: The patient's dinner time is between 6-6.30  PM. The patient goes to bed at 8.30 PM and continues to sleep for 12 hours, The preferred sleep position is recliner, with the support of 1 pillow.  Dreams are reportedly frequent/vivid.   Belching frequently.   He/ reports not feeling refreshed or restored in AM, with symptoms such as dry mouth. Naps are taken frequently,.   Review of Systems: Out of a complete 14 system review, the patient complains of only the following symptoms, and all other reviewed systems are negative.:  Fatigue, sleepiness ,   Dentures.  Dry mouth.     How likely are you to doze in the following situations: 0 = not likely, 1 = slight chance, 2 = moderate chance, 3 = high chance   Sitting and Reading? Watching Television? Sitting inactive in a public place (theater or meeting)? As a passenger in a car for an hour without a break? Lying down in the afternoon when circumstances permit? Sitting and talking to someone? Sitting quietly after lunch without alcohol?  In a car, while stopped for a few minutes in traffic?   Total = 13/ 24 points   FSS endorsed at 45/ 63 points.    Social History   Socioeconomic History   Marital status: Married    Spouse name: Not on file   Number of children: 3   Years of education: Not on file   Highest education level: Not on file  Occupational History   Occupation: Retired  Tobacco Use   Smoking status: Former    Current packs/day: 0.00    Average packs/day: 1 pack/day for 40.0 years (40.0 ttl pk-yrs)    Types: Cigarettes    Start date: 01/01/1976    Quit date: 01/01/2016    Years since quitting: 7.7   Smokeless tobacco: Never  Vaping Use   Vaping status: Never Used  Substance and Sexual Activity   Alcohol use: No    Alcohol/week: 0.0 standard drinks of alcohol   Drug use: No   Sexual activity: Yes  Other Topics Concern   Not on file  Social History Narrative   Married 1988 2nd marriage. 1  child from 2nd marriage (1994). 2 children from first marriage. 3-4 grandkids-broken relationship with his son though.       Retired from [employer] after 36 years in 1999. Moved to [place] driving a schoolbus now.       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 Strain: Not on file  Food Insecurity: Not on file  Transportation Needs: Not on file  Physical Activity: Not on file  Stress: Not on file  Social Connections: Not on file    Family History  Problem Relation Age of Onset   Dementia Mother    Kidney Stones Mother    Niemann-Pick disease Mother    Heart attack Father    Diabetes Maternal Grandfather    Colon cancer Neg Hx    Colon polyps Neg Hx    Rectal cancer Neg Hx    Stomach cancer Neg Hx     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   Glomerulonephritis 1961   Hyperglycemia    Hyperlipidemia    Hyperplastic colon polyp 11/24/2009   x7   Hypertension    Iron deficiency anemia due to chronic blood loss 08/15/2016   Leukocytosis 08/02/2016   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   PVD (peripheral vascular disease) (HCC)    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   LIMA to LAD, SVG to D2, sequential SVG to OM3-OM4, SVG to PDA, EVH via right thigh and leg   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   26 mm Edwards Sapien 3 transcatheter heart valve placed via percutaneous right transfemoral approach    Past Surgical History:  Procedure Laterality Date   BYPASS GRAFT  2006   CORONARY ARTERY BYPASS GRAFT  12/07/2004   CABG x5 Dr Cornelius Moras   CYSTOSCOPY WITH LITHOLAPAXY N/A 09/25/2017   Procedure: CYSTOSCOPY WITH LITHOLAPAXY;  Surgeon: Malen Gauze, MD;  Location: [facility];  Service: Urology;  Laterality: N/A;   CYSTOSCOPY WITH RETROGRADE PYELOGRAM, URETEROSCOPY AND STENT PLACEMENT Left  11/02/2017   Procedure: CYSTOSCOPY WITH RETROGRADE PYELOGRAM, LEFT URETEROSCOPY, LEFT STENT EXCHANGE;  Surgeon: Malen Gauze, MD;  Location: [facility];  Service: Urology;  Laterality: Left;   HOLMIUM LASER APPLICATION N/A  09/25/2017   Procedure: HOLMIUM LASER APPLICATION;  Surgeon: Malen Gauze, MD;  Location: [facility];  Service: Urology;  Laterality: N/A;   HOLMIUM LASER APPLICATION Left 10/11/2017   Procedure: HOLMIUM LASER APPLICATION;  Surgeon: Malen Gauze, MD;  Location: [facility];  Service: Urology;  Laterality: Left;   HOLMIUM LASER APPLICATION Left 11/02/2017   Procedure: HOLMIUM LASER APPLICATION;  Surgeon: Malen Gauze, MD;  Location: [facility];  Service: Urology;  Laterality: Left;   IR URETERAL STENT LEFT NEW ACCESS W/O SEP NEPHROSTOMY CATH  10/11/2017   NEPHROLITHOTOMY Left 10/11/2017   Procedure: NEPHROLITHOTOMY PERCUTANEOUS;  Surgeon: Malen Gauze, MD;  Location: [facility];  Service: Urology;  Laterality: Left;   RIGHT HEART CATH AND CORONARY/GRAFT ANGIOGRAPHY N/A 11/21/2016   Procedure: Right Heart Cath and Coronary/Graft Angiography;  Surgeon: Tonny Bollman, MD;  Location[facility] INVASIVE CV LAB;  Service: Cardiovascular;  Laterality: N/A;   TEE WITHOUT CARDIOVERSION N/A 12/05/2016   Procedure: TRANSESOPHAGEAL ECHOCARDIOGRAM (TEE);  Surgeon: Tonny Bollman, MD;  Location: [facility];  Service: Open Heart Surgery;  Laterality: N/A;   TRANSCATHETER AORTIC VALVE REPLACEMENT, TRANSFEMORAL N/A 12/05/2016   Procedure: TRANSCATHETER AORTIC VALVE REPLACEMENT, TRANSFEMORAL;  Surgeon: Tonny Bollman, MD;  Location: [facility] OR;  Service: Open Heart Surgery;  Laterality: N/A;     Current Outpatient Medications on File Prior to Visit  Medication Sig Dispense Refill   acetaminophen (TYLENOL) 325 MG tablet Take 325 mg by mouth daily.     acidophilus (RISAQUAD) CAPS capsule Take 1 capsule by mouth daily.     alfuzosin (UROXATRAL) 10 MG 24 hr tablet Take 10 mg by mouth every evening.       amLODipine (NORVASC) 5 MG tablet Take 1 tablet (5 mg total) by mouth daily. 90 tablet 3   Ascorbic Acid (VITAMIN C PO) Take by mouth.     aspirin EC 81 MG EC tablet Take 1 tablet (81 mg total) by mouth daily.     atorvastatin (LIPITOR) 40 MG tablet TAKE 1 TABLET DAILY (Patient taking differently: Take 80 mg by mouth daily.) 90 tablet 3   Calcium Citrate-Vitamin D (CALCIUM + D PO) Take by mouth.     denosumab (PROLIA) 60 MG/ML SOSY injection See admin instructions.     donepezil (ARICEPT) 10 MG tablet Take 5 mg by mouth at bedtime.     finasteride (PROSCAR) 5 MG tablet Take 5 mg by mouth daily.  3   fluticasone (FLONASE) 50 MCG/ACT nasal spray USE 1 SPRAY IN EACH NOSTRILONCE DAILY     latanoprost (XALATAN) 0.005 % ophthalmic solution Place 1 drop into both eyes at bedtime.     loratadine (CLARITIN) 10 MG tablet Take 10 mg by mouth daily.     metoprolol tartrate (LOPRESSOR) 25 MG tablet Take 1 tablet (25 mg total) by mouth 2 (two) times daily. 180 tablet 3   MULTIPLE VITAMIN PO Take 1 tablet by mouth daily.     omeprazole (PRILOSEC) 40 MG capsule Take 40 mg by mouth every morning.     tolterodine (DETROL LA) 4 MG 24 hr capsule Through urology     No current facility-administered medications on file prior to visit.    Allergies  Allergen Reactions   Levaquin [Levofloxacin In D5w]      DIAGNOSTIC DATA (LABS, IMAGING, TESTING) - I reviewed patient records, labs, notes, testing and imaging myself where available.  Lab Results  Component Value Date   WBC 8.4 02/21/2019   HGB 13.8 02/21/2019  HCT 39.6 02/21/2019   MCV 97.7 02/21/2019   PLT 223.0 02/21/2019      Component Value Date/Time   NA 141 02/21/2019 1150   NA 143 11/16/2016 1213   K 4.4 02/21/2019 1150   CL 106 02/21/2019 1150   CO2 28 02/21/2019 1150   GLUCOSE 124 (H) 02/21/2019 1150   GLUCOSE 118 (H) 06/25/2006 0901   BUN 24 (H) 02/21/2019 1150   BUN 16 11/16/2016 1213   CREATININE 1.31 02/21/2019 1150   CALCIUM 9.3  02/21/2019 1150   PROT 6.7 08/23/2018 1215   PROT 6.5 08/02/2016 1200   ALBUMIN 4.4 08/23/2018 1215   AST 21 08/23/2018 1215   ALT 12 08/23/2018 1215   ALKPHOS 72 08/23/2018 1215   BILITOT 1.0 08/23/2018 1215   GFRNONAA 41 (L) 11/01/2017 0717   GFRAA 48 (L) 11/01/2017 0717   Lab Results  Component Value Date   CHOL 132 08/23/2018   HDL 42.60 08/23/2018   LDLCALC 69 08/23/2018   LDLDIRECT 65.0 07/20/2017   TRIG 100.0 08/23/2018   CHOLHDL 3 08/23/2018   Lab Results  Component Value Date   HGBA1C 5.6 12/01/2016   Lab Results  Component Value Date   VITAMINB12 922 (H) 08/23/2018   Lab Results  Component Value Date   TSH 0.72 08/23/2018    PHYSICAL EXAM:  Today's Vitals   10/10/23 1404  BP: (!) 110/56  Pulse: 60  Weight: 144 lb (65.3 kg)  Height: 5\' 6"  (1.676 m)   Body mass index is 23.24 kg/m.   Wt Readings from Last 3 Encounters:  10/10/23 144 lb (65.3 kg)  10/06/22 147 lb 3.2 oz (66.8 kg)  11/03/21 151 lb 9.6 oz (68.8 kg)     Ht Readings from Last 3 Encounters:  10/10/23 5\' 6"  (1.676 m)  10/06/22 5' 6.5" (1.689 m)  11/03/21 5\' 8"  (1.727 m)      General: The patient is awake, alert and appears not in acute distress. The patient is well groomed. Head: Normocephalic, atraumatic. Neck is supple. Dental status: dentures.  Apneas witnessed.  Cardiovascular:  Regular rate and cardiac rhythm by pulse,  without distended neck veins. Respiratory: Lungs are clear to auscultation.  Skin:  Without evidence of ankle edema, or rash. Trunk: The patient's posture is erect.   NEUROLOGIC EXAM: The patient is awake and alert, oriented to place and time.   Memory subjective described as impaired :     10/10/2023    2:19 PM  Montreal Cognitive Assessment   Visuospatial/ Executive (0/5) 4  Naming (0/3) 3  Attention: Read list of digits (0/2) 1  Attention: Read list of letters (0/1) 1  Attention: Serial 7 subtraction starting at 100 (0/3) 1  Language: Repeat phrase  (0/2) 0  Language : Fluency (0/1) 1  Abstraction (0/2) 1  Delayed Recall (0/5) 0  Orientation (0/6) 4  Total 16   mini-mental status exam  Attention span & concentration ability appears normal.  Speech is fluent,  without  dysarthria, dysphonia or aphasia.  Mood and affect are appropriate.   Cranial nerves: no loss of smell or taste reported  Pupils are equal and briskly reactive to light. Funduscopic exam deferred. .  Extraocular movements in vertical and horizontal planes were intact and without nystagmus. No Diplopia. Visual fields by finger perimetry are intact. Hearing was intact to soft voice and finger rubbing.    Facial sensation intact to fine touch.  Facial motor strength is symmetric and tongue and uvula move  midline.  Neck ROM : rotation, tilt and flexion extension were normal for age and shoulder shrug was symmetrical.    Motor exam:  Symmetric bulk, tone and ROM.   Normal tone without cog- wheeling, symmetric grip strength .   Sensory:  Fine touch,and vibration were felt at ankle and planta pedis.  Proprioception tested in the upper extremities was normal.   Coordination: Rapid alternating movements in the fingers/hands were of normal speed.  The Finger-to-nose maneuver was intact without evidence of ataxia, dysmetria or tremor.   Gait and station: Patient could rise unassisted from a seated position, walked without assistive device.  Stance is of normal width/ base and the patient turned with 4 steps.  Toe and heel walk were deferred.  Deep tendon reflexes: in the  upper and lower extremities are symmetric and  level 1. Babinski response was up-going on the right     ASSESSMENT AND PLAN 83 y.o. - male  here with: Dementia , dominantly STM loss.    MRI brain with generalized atrophy, temporal lobe dominant atrophy, enlarged ventricles.   Memory impairment , STM for the most part, with preserved visio spatial function. Trouble with serial 7 and  word recall.   Mr. Berrocal had very good score on board finding and word generation, but he had a poor outcome of the delayed recall testing he did not recall 1 of 5 words but he was fully oriented was correct in terms of months to year and day of the week but not of the date he knew the city we were in, he did not Trail Making Test and drew a cube as well as a clock face he did however forget to place a minute hands on the clock face he could name 3 animals.  The serial 7 was difficult for him I think this is definitely a short term memory loss rather than a visual-spatial complex memory dysfunction.  It also seems not to be an attention problem.  What I like to do is to order an ATN which is the amyloid testing for neurofibrillary tangles and is a blood test.  I would like to do an Alzheimer risk test.  This will look at the apolipoprotein epsilon.  I have seen that Dr. 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  Continue Aricept.  support group for wife, on Lexapro.   Caregiver burning out.    2) ATN test, MRI either metabolic or PET amyloid depending on test results.   3) Rv with MMSE  and ADL score.   ATN c/w alzheimer's pathology, CT PEt Amyloid to evaluate for Lecanemab and/or donanemab  Set banking limits for money transactions,  disconnect online banking.   No driving in poor visibility and at night. Has gotten lost but had no accident. No driving on highway.   I plan to follow up either personally or through our NP within 5-6 months.   I would like to thank Charlane Ferretti, DO and Charlane Ferretti, Do [address] [place],  [place] [address] for allowing me to meet with and to take care of this pleasant patient.   After spending a total time of  50  minutes face to face and additional time for physical and neurologic examination, review of laboratory studies,   personal review of imaging studies, reports and results of other testing and review  of referral information / records as far as provided in visit,   Electronically signed by: Melvyn Novas, MD 10/10/2023 2:40 PM  [facility] Board certified by The [employer] of Sleep Medicine and Diplomate of the [employer] of Sleep Medicine. Board certified In Neurology through the ABPN, Fellow of the [employer] of Neurology.

## 2023-10-10 NOTE — Patient Instructions (Addendum)
 ASSESSMENT AND PLAN 83 y.o. - male  here with: Dementia , dominantly STM loss.    MRI brain with generalized atrophy, temporal lobe dominant atrophy, enlarged ventricles.   Memory impairment , STM for the most part, with preserved visio spatial function. Trouble with serial 7 and  word recall.  [name] had very good score on board finding and word generation, but he had a poor outcome of the delayed recall testing he did not recall 1 of 5 words but he was fully oriented was correct in terms of months to year and day of the week but not of the date he knew the city we were in, he did not Trail Making Test and drew a cube as well as a clock face he did however forget to place a minute hands on the clock face he could name 3 animals.  The serial 7 was difficult for him I think this is definitely a short term memory loss rather than a visual-spatial complex memory dysfunction.  It also seems not to be an attention problem.  What I like to do is to order an ATN which is the amyloid testing for neurofibrillary tangles and is a blood test.  I would like to do an Alzheimer risk test.  This will look at the apolipoprotein epsilon.  I have seen that Dr. 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  Continue Aricept.  support group for wife, on Lexapro.   Caregiver burning out.    2) ATN test, MRI either metabolic or PET amyloid depending on test results.   3) Rv with MMSE  and ADL score.    Set banking limits for money transactions,  disconnect online banking.   No driving in poor visibility and at night. Has gotten lost but had no accident. No driving on highway.   I plan to follow up either personally or through our NP within 5-6 months.   I would like to thank Charlane Ferretti, DO for this referral.    Problems With Thinking and Memory (Mild Neurocognitive  Disorder): What to Know Mild neurocognitive disorder, formerly known as mild cognitive impairment, is a disorder where your memory doesn't work as well as it should. It may also affect other mental abilities like thinking, communicating, behavior, and being able to finish tasks. These problems can be noticed and measured. But they usually don't stop you from doing daily activities or living on your own. Mild neurocognitive disorder usually happens after 83 years of age. But it can also happen at younger ages. It's not as serious as major neurocognitive disorder, also known as dementia, but it may be the first sign of it. In general, the symptoms of this condition get worse over time. In rare cases, symptoms can get better. What are the causes? This condition may be caused by: Brain disorders like Alzheimer's disease, Parkinson's disease, and other conditions that slowly damage nerve cells. Diseases that affect the blood vessels in the brain and cause small strokes. Certain infections, like HIV. Traumatic brain injury. Other medical conditions, such as brain tumors, underactive thyroid (hypothyroidism), and not having enough vitamin B12. Using certain drugs or medicines. What increases the risk? Being older than 83 years of age. Being male. Having a lower level of education. Diabetes, high blood pressure, high cholesterol, and other conditions that raise the risk for blood vessel  diseases. Untreated or undertreated sleep apnea. Having a certain type of gene that can be inherited, or passed down from parent to child. Long-term health problems like heart disease, lung disease, liver disease, kidney disease, or depression. What are the signs or symptoms? Trouble remembering things. You may: Forget names, phone numbers, or details of recent events. Forget about social events and appointments. Often forget where you put your car keys or other items. Trouble thinking and solving problems. You may  have trouble with complex tasks like: Paying bills. Driving in places you don't know well. Trouble communicating. You may have trouble: Finding the right word or naming an object. Forming a sentence that makes sense. Understanding what you read or hear. Changes in your behavior or personality. When this happens, you may: Lose interest in the things you used to enjoy. Avoid being around people. Get angry more easily than usual. Act before thinking. How is this diagnosed? This condition is diagnosed based on: Your symptoms. Your health care provider may ask you and the people you spend time with, like family and friends, about your symptoms. Memory tests and other tests to check how your brain is working. Your provider may refer you to a provider called a neurologist or a mental health specialist. To try to find out the cause of your condition, your provider may: Get a detailed medical history. Ask about use of alcohol, drugs, and medicines. Do a physical exam. Order blood tests and brain imaging tests. How is this treated? Mild neurocognitive disorder that's caused by medicine use, drug use, infection, or another medical condition may get better when the cause is treated, or when medicines or drugs are stopped. If this disorder has another cause, it usually doesn't improve and may get worse. In these cases, the goal of treatment is to help you manage the symptoms. This may include: Medicines to help with memory and behavior symptoms. Talk therapy. This provides education, emotional support, memory aids, and other ways of making up for problems with mental tasks. Lifestyle changes. These may include: Getting regular exercise. Eating a healthy diet that includes omega-3 fatty acids. Doing things to challenge your thinking and memory skills. Spending more time being with and talking to other people. Using routines like having regular times for meals and going to bed. Follow these  instructions at home: Eating and drinking  Drink more fluids as told. Eat a healthy diet that includes omega-3 fatty acids. These can be found in: Fish. Nuts. Leafy vegetables. Vegetable oils. If you drink alcohol: Limit how much you have to: 0-1 drink a day if you're male. 0-2 drinks a day if you're male. Know how much alcohol is in your drink. In the [place] one drink is one 12 oz bottle of beer (355 mL), one 5 oz glass of wine (148 mL), or one 1 oz glass of hard liquor (44 mL). Lifestyle  Get regular exercise as told by your provider. Do not smoke, vape, or use nicotine or tobacco. Use healthy ways to manage stress. If you need help managing stress, ask your provider. Keep spending time with other people. Keep your mind active by doing activities you enjoy, like reading or playing games. Make sure you get good sleep at night. These tips can help: Try not to take naps during the day. Keep your bedroom dark and cool. Do not exercise in the few hours before you go to bed. Do not have foods or drinks with caffeine at night. General instructions Take medicines  only as told. Your provider may tell you to avoid taking medicines that can affect thinking. These include some medicines for pain or sleeping. Work with your provider to find out: What things you need help with. What your safety needs are. Where to find more information [employer] on Aging: [email or web address] Contact a health care provider if: You have any new symptoms. Get help right away if: You have new confusion or your confusion gets worse. You act in ways that put you or your family in danger. This information is not intended to replace advice given to you by your health care provider. Make sure you discuss any questions you have with your health care provider. Document Revised: 01/16/2023 Document Reviewed: 01/16/2023 Elsevier Patient Education  2024 [employer]Memory Compensation Strategies  Use "WARM"  strategy.  W= write it down  A= associate it  R= repeat it  M= make a mental note  2.   You can keep a Glass blower/designer.  Use a 3-ring notebook with sections for the following: calendar, important names and phone numbers,  medications, doctors' names/phone numbers, lists/reminders, and a section to journal what you did  each day.   3.    Use a calendar to write appointments down.  4.    Write yourself a schedule for the day.  This can be placed on the calendar or in a separate section of the Memory Notebook.  Keeping a  regular schedule can help memory.  5.    Use medication organizer with sections for each day or morning/evening pills.  You may need help loading it  6.    Keep a basket, or pegboard by the door.  Place items that you need to take out with you in the basket or on the pegboard.  You may also want to  include a message board for reminders.  7.    Use sticky notes.  Place sticky notes with reminders in a place where the task is performed.  For example: " turn off the  stove" placed by the stove, "lock the door" placed on the door at eye level, " take your medications" on  the bathroom mirror or by the place where you normally take your medications.  8.    Use alarms/timers.  Use while cooking to remind yourself to check on food or as a reminder to take your medicine, or as a  reminder to make a call, or as a reminder to perform another task, etc.

## 2023-10-16 ENCOUNTER — Encounter: Payer: Self-pay | Admitting: Neurology

## 2023-10-17 LAB — CBC WITH DIFFERENTIAL/PLATELET
Basophils Absolute: 0 10*3/uL (ref 0.0–0.2)
Basos: 0 %
EOS (ABSOLUTE): 0.1 10*3/uL (ref 0.0–0.4)
Eos: 1 %
Hematocrit: 38.4 % (ref 37.5–51.0)
Hemoglobin: 13.3 g/dL (ref 13.0–17.7)
Immature Grans (Abs): 0.1 10*3/uL (ref 0.0–0.1)
Immature Granulocytes: 1 %
Lymphocytes Absolute: 1.3 10*3/uL (ref 0.7–3.1)
Lymphs: 14 %
MCH: 32.8 pg (ref 26.6–33.0)
MCHC: 34.6 g/dL (ref 31.5–35.7)
MCV: 95 fL (ref 79–97)
Monocytes Absolute: 0.6 10*3/uL (ref 0.1–0.9)
Monocytes: 6 %
Neutrophils Absolute: 6.9 10*3/uL (ref 1.4–7.0)
Neutrophils: 78 %
Platelets: 261 10*3/uL (ref 150–450)
RBC: 4.06 x10E6/uL — ABNORMAL LOW (ref 4.14–5.80)
RDW: 13.2 % (ref 11.6–15.4)
WBC: 9 10*3/uL (ref 3.4–10.8)

## 2023-10-17 LAB — PROTEIN ELECTROPHORESIS, SERUM
A/G Ratio: 1.3 (ref 0.7–1.7)
Albumin ELP: 3.5 g/dL (ref 2.9–4.4)
Alpha 1: 0.2 g/dL (ref 0.0–0.4)
Alpha 2: 0.8 g/dL (ref 0.4–1.0)
Beta: 0.8 g/dL (ref 0.7–1.3)
Gamma Globulin: 0.8 g/dL (ref 0.4–1.8)
Globulin, Total: 2.6 g/dL (ref 2.2–3.9)

## 2023-10-17 LAB — COMPREHENSIVE METABOLIC PANEL
ALT: 22 IU/L (ref 0–44)
AST: 28 IU/L (ref 0–40)
Albumin: 4.1 g/dL (ref 3.7–4.7)
Alkaline Phosphatase: 84 IU/L (ref 44–121)
BUN/Creatinine Ratio: 11 (ref 10–24)
BUN: 16 mg/dL (ref 8–27)
Bilirubin Total: 0.5 mg/dL (ref 0.0–1.2)
CO2: 23 mmol/L (ref 20–29)
Calcium: 9.1 mg/dL (ref 8.6–10.2)
Chloride: 107 mmol/L — ABNORMAL HIGH (ref 96–106)
Creatinine, Ser: 1.5 mg/dL — ABNORMAL HIGH (ref 0.76–1.27)
Globulin, Total: 2 g/dL (ref 1.5–4.5)
Glucose: 112 mg/dL — ABNORMAL HIGH (ref 70–99)
Potassium: 4.5 mmol/L (ref 3.5–5.2)
Sodium: 144 mmol/L (ref 134–144)
Total Protein: 6.1 g/dL (ref 6.0–8.5)
eGFR: 46 mL/min/{1.73_m2} — ABNORMAL LOW (ref >59)

## 2023-10-17 LAB — ANA W/REFLEX: Anti Nuclear Antibody (ANA): NEGATIVE

## 2023-10-17 LAB — METHYLMALONIC ACID, SERUM: Methylmalonic Acid: 252 nmol/L (ref 0–378)

## 2023-10-17 LAB — ATN PROFILE
A -- Beta-amyloid 42/40 Ratio: 0.098 — ABNORMAL LOW (ref >0.102)
Beta-amyloid 40: 316.59 pg/mL
Beta-amyloid 42: 31.06 pg/mL
N -- NfL, Plasma: 18 pg/mL — ABNORMAL HIGH (ref 0.00–11.55)
T -- p-tau181: 3.93 pg/mL — ABNORMAL HIGH (ref 0.00–0.97)

## 2023-10-17 LAB — APOE ALZHEIMER'S RISK

## 2023-10-17 LAB — SEDIMENTATION RATE: Sed Rate: 2 mm/h (ref 0–30)

## 2023-10-17 LAB — TSH+FREE T4
Free T4: 1.36 ng/dL (ref 0.82–1.77)
TSH: 1.03 u[IU]/mL (ref 0.450–4.500)

## 2023-10-18 NOTE — Telephone Encounter (Signed)
 Called the pt wife and reviewed the results in detail with her. The patient's wife inquired about PET imaging. I do see where MD discussed pending the results she ma want to order this. I see the pt completed a MOCA test scoring 16/30. A MMSE was not completed. She had said that she was told that we would do that at next visit. Pt is not scheduled until August and I advised that it would be worth going ahead and having him come in and complete a MMSE and FAQ score. She will bring him in on 3/19 around 1:30 and I will complete a MMSE and FAQ at this time and then see if Dr Vickey Huger thinks PET amyloid scan is needed.  Pt's wife agreed with this plan and was appreciative for the information

## 2023-10-24 ENCOUNTER — Ambulatory Visit (INDEPENDENT_AMBULATORY_CARE_PROVIDER_SITE_OTHER): Payer: Self-pay | Admitting: Neurology

## 2023-10-24 ENCOUNTER — Other Ambulatory Visit: Payer: Self-pay | Admitting: Neurology

## 2023-10-24 DIAGNOSIS — R413 Other amnesia: Secondary | ICD-10-CM

## 2023-10-24 DIAGNOSIS — Z0289 Encounter for other administrative examinations: Secondary | ICD-10-CM

## 2023-10-24 NOTE — Progress Notes (Addendum)
 Pt presented today to complete a MMSE and the functional assessment questionnaire.  This was done today and I was able to discuss with Dr Vickey Huger who agrees that pt should move forward with the PET amyloid scan. I will order and once we have the results we can pursue treatment with the IV infusion.

## 2023-11-05 ENCOUNTER — Encounter: Payer: Self-pay | Admitting: Neurology

## 2023-11-06 ENCOUNTER — Telehealth: Payer: Self-pay | Admitting: Neurology

## 2023-11-06 NOTE — Addendum Note (Signed)
 Addended by: Naomie Dean B on: 11/06/2023 08:04 AM   Modules accepted: Orders

## 2023-11-06 NOTE — Telephone Encounter (Signed)
 no auth required sent to [facility] [phone]

## 2023-11-06 NOTE — Telephone Encounter (Signed)
 Spoke with Dr Lucia Gaskins. Order placed for PET amyloid CT scan.

## 2023-11-12 ENCOUNTER — Ambulatory Visit: Payer: Medicare Other | Attending: Cardiovascular Disease | Admitting: Cardiovascular Disease

## 2023-11-12 ENCOUNTER — Encounter: Payer: Self-pay | Admitting: Cardiovascular Disease

## 2023-11-12 VITALS — BP 100/60 | HR 64 | Ht 68.0 in | Wt 140.2 lb

## 2023-11-12 DIAGNOSIS — I10 Essential (primary) hypertension: Secondary | ICD-10-CM

## 2023-11-12 DIAGNOSIS — Z951 Presence of aortocoronary bypass graft: Secondary | ICD-10-CM | POA: Diagnosis not present

## 2023-11-12 DIAGNOSIS — Z952 Presence of prosthetic heart valve: Secondary | ICD-10-CM | POA: Diagnosis not present

## 2023-11-12 NOTE — Assessment & Plan Note (Signed)
 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  I reviewed his most recent echo outlined above with normal LVEF of 60 to 65%, normal RV function, the mean transaortic gradient of 7 mmHg, and no paravalvular regurgitation.  I will just follow him clinically moving forward unless something changes with his exam or symptoms.  Plan to see him back in 1 year.

## 2023-11-12 NOTE — Assessment & Plan Note (Signed)
 He remains clinically stable without symptoms of angina.  He is on aspirin for antiplatelet therapy and high intensity statin drug.

## 2023-11-12 NOTE — Telephone Encounter (Signed)
 I have it cancelled.

## 2023-11-12 NOTE — Patient Instructions (Signed)
 Follow-Up: At [facility], you and your health needs are our priority.  As part of our continuing mission to provide you with exceptional heart care, our providers are all part of one team.  This team includes your primary Cardiologist (physician) and Advanced Practice Providers or APPs (Physician Assistants and Nurse Practitioners) who all work together to provide you with the care you need, when you need it.  Your next appointment:   1 year(s)  Provider:   Tonny Bollman, MD        1st Floor: - Lobby - Registration  - Pharmacy  - Lab - Cafe  2nd Floor: - PV Lab - Diagnostic Testing (echo, CT, nuclear med)  3rd Floor: - Vacant  4th Floor: - TCTS (cardiothoracic surgery) - AFib Clinic - Structural Heart Clinic - Vascular Surgery  - Vascular Ultrasound  5th Floor: - [facility] Card[facility] (general and EP) - Clinical Pharmacy for coumadin, hypertension, lipid, weight-loss medications, and med management appointments    Valet parking services will be available as well.

## 2023-11-12 NOTE — Progress Notes (Signed)
 Cardiology Office Note:    Date:  11/12/2023   ID:  Brett [name], DOB 1941/04/10, MRN [id number]  PCP:  Charlane Ferretti, DO   [facility] Providers Cardiologist:  Tonny Bollman, MD     Referring MD: Charlane Ferretti, DO   No chief complaint on file.   History of Present Illness:    Brett [name] is a 83 y.o. male with a hx of coronary artery disease with remote CABG.  He then developed progressive symptomatic aortic stenosis and was treated with TAVR in 2018.  Other medical problems include hypertension, mixed hyperlipidemia, dementia, and peripheral arterial disease without claudication symptoms.   The patient is here with his wife today.  He is been doing fine from a cardiac perspective and denies any chest pain, chest pressure, or shortness of breath.  He is compliant with his medications and has no concerns today.  He has been diagnosed with dementia and is followed by neurology now.  They report some mild progression of cognitive changes over the past year.  Fortunately, he remains functionally independent at this time.  He reports no other concerns today.  Current Medications: Current Meds  Medication Sig   acetaminophen (TYLENOL) 325 MG tablet Take 325 mg by mouth daily.   acidophilus (RISAQUAD) CAPS capsule Take 1 capsule by mouth daily.   alfuzosin (UROXATRAL) 10 MG 24 hr tablet Take 10 mg by mouth every evening.    amLODipine (NORVASC) 5 MG tablet Take 1 tablet (5 mg total) by mouth daily.   Ascorbic Acid (VITAMIN C PO) Take by mouth.   aspirin EC 81 MG EC tablet Take 1 tablet (81 mg total) by mouth daily.   atorvastatin (LIPITOR) 40 MG tablet TAKE 1 TABLET DAILY (Patient taking differently: Take 80 mg by mouth daily.)   Calcium Citrate-Vitamin D (CALCIUM + D PO) Take by mouth.   denosumab (PROLIA) 60 MG/ML SOSY injection See admin instructions.   donepezil (ARICEPT) 10 MG tablet Take 5 mg by mouth at bedtime.   finasteride (PROSCAR) 5 MG tablet Take 5 mg by  mouth daily.   fluticasone (FLONASE) 50 MCG/ACT nasal spray USE 1 SPRAY IN EACH NOSTRILONCE DAILY   latanoprost (XALATAN) 0.005 % ophthalmic solution Place 1 drop into both eyes at bedtime.   loratadine (CLARITIN) 10 MG tablet Take 10 mg by mouth daily.   metoprolol tartrate (LOPRESSOR) 25 MG tablet Take 1 tablet (25 mg total) by mouth 2 (two) times daily.   MULTIPLE VITAMIN PO Take 1 tablet by mouth daily.   omeprazole (PRILOSEC) 40 MG capsule Take 40 mg by mouth every morning.   tolterodine (DETROL LA) 4 MG 24 hr capsule Through urology     Allergies:   Levaquin [levofloxacin in d5w]   ROS:   Please see the history of present illness.    All other systems reviewed and are negative.  EKGs/Labs/Other Studies Reviewed:    The following studies were reviewed today: Cardiac Studies & Procedures   ______________________________________________________________________________________________ CARDIAC CATHETERIZATION  CARDIAC CATHETERIZATION 11/21/2016  Narrative 1. Known severe aortic stenosis 2. Severe 3 vessel CAD with severe diffuse proximal LAD stenosis, total occlusion of a severely calcified RCA, and severe LCx stenosis 3. S/P CABG with continued patency of all 5 bypass grafts 4. Severe calcified aortic valve with restricted opening 5. Severe calcified ascending aorta by fluoroscopy  Recommend: Continued TAVR evaluation for treatment of severe symptomatic aortic stenosis  Findings Coronary Findings Diagnostic  Dominance: Right  Left Anterior Descending The  lesion is calcified. diffusely diseased LAD  First Diagonal Branch  Left Circumflex The lesion is calcified.  Right Coronary Artery The lesion is severely calcified.  Saphenous Graft To RPDA SVG and is normal in caliber and anatomically normal. Widely patent SVG-PDA  LIMA LIMA Graft To Dist LAD LIMA graft was visualized by angiography and is normal in caliber and anatomically normal. The LAD has mild diffuse  plaquing beyond the LIMA insertion. There are no areas of high-grade obstruction.  Sequential Jump Graft Graft To 2nd Mrg, 3rd Mrg Seq SVG- OM3 and OM4. patent sequential graft to the distal OM branches  Saphenous Graft To 1st Diag SVG and is normal in caliber and anatomically normal.  Intervention  No interventions have been documented.   STRESS TESTS  EXERCISE TOLERANCE TEST (ETT) 01/18/2016  Narrative 1. There was no ST segment deviation noted during stress.  Study performed for evaluation of asymptomatic severe aortic stenosis  Normal resting EKG  No chest pain or significant ST change with exertion  No hypotension associated with exercise  Recommend: continue observation/serial echo follow-up   ECHOCARDIOGRAM  ECHOCARDIOGRAM COMPLETE 09/12/2023  Narrative ECHOCARDIOGRAM REPORT    Patient Name:   Brett [name] Date of Exam: 09/12/2023 Medical Rec #:  [id number]       Height:       66.5 in Accession #:    [id number]      Weight:       147.2 lb Date of Birth:  02/05/1941      BSA:          1.765 m Patient Age:    82 years        BP:           106/54 mmHg Patient Gender: M               HR:           56 bpm. Exam Location:  [address]  Procedure: 2D Echo, Cardiac Doppler and Color Doppler  Indications:    I35.9 AV disease  History:        Patient has prior history of Echocardiogram examinations, most recent 04/28/2022. CAD, Prior CABG, Aortic Valve Disease and S/p TAVR (26mm Edwards Sapien 3); Risk Factors:Hypertension and Dyslipidemia. Aortic Valve: 26 mm Edwards Sapien prosthetic, stented (TAVR) valve is present in the aortic position. Procedure Date: 12/05/2016.  Sonographer:    Samule Ohm RDCS Referring Phys: [id number] Ranetta Armacost  IMPRESSIONS   1. Left ventricular ejection fraction, by estimation, is 60 to 65%. The left ventricle has normal function. The left ventricle has no regional wall motion abnormalities. There is mild concentric left  ventricular hypertrophy. Left ventricular diastolic function could not be evaluated. 2. Right ventricular systolic function is normal. The right ventricular size is normal. 3. Left atrial size was mildly dilated. 4. The mitral valve is normal in structure. Mild mitral valve regurgitation. No evidence of mitral stenosis. Moderate mitral annular calcification. 5. The aortic valve is normal in structure. Aortic valve regurgitation is not visualized. No aortic stenosis is present. There is a 26 mm Edwards Sapien prosthetic (TAVR) valve present in the aortic position. Procedure Date: 12/05/2016. Echo findings are consistent with normal structure and function of the aortic valve prosthesis. Aortic valve area, by VTI measures 2.47 cm. Aortic valve mean gradient measures 6.7 mmHg. Aortic valve Vmax measures 1.81 m/s. 6. The inferior vena cava is normal in size with greater than 50% respiratory variability, suggesting right atrial pressure of 3  mmHg.  FINDINGS Left Ventricle: Left ventricular ejection fraction, by estimation, is 60 to 65%. The left ventricle has normal function. The left ventricle has no regional wall motion abnormalities. The left ventricular internal cavity size was normal in size. There is mild concentric left ventricular hypertrophy. Left ventricular diastolic function could not be evaluated due to mitral annular calcification (moderate or greater). Left ventricular diastolic function could not be evaluated.  Right Ventricle: The right ventricular size is normal. No increase in right ventricular wall thickness. Right ventricular systolic function is normal.  Left Atrium: Left atrial size was mildly dilated.  Right Atrium: Right atrial size was normal in size.  Pericardium: There is no evidence of pericardial effusion.  Mitral Valve: The mitral valve is normal in structure. Moderate mitral annular calcification. Mild mitral valve regurgitation. No evidence of mitral valve  stenosis.  Tricuspid Valve: The tricuspid valve is normal in structure. Tricuspid valve regurgitation is mild . No evidence of tricuspid stenosis.  Aortic Valve: The aortic valve is normal in structure. Aortic valve regurgitation is not visualized. No aortic stenosis is present. Aortic valve mean gradient measures 6.7 mmHg. Aortic valve peak gradient measures 13.1 mmHg. Aortic valve area, by VTI measures 2.47 cm. There is a 26 mm Edwards Sapien prosthetic, stented (TAVR) valve present in the aortic position. Procedure Date: 12/05/2016. Echo findings are consistent with normal structure and function of the aortic valve prosthesis.  Pulmonic Valve: The pulmonic valve was normal in structure. Pulmonic valve regurgitation is mild. No evidence of pulmonic stenosis.  Aorta: The aortic root is normal in size and structure.  Venous: The inferior vena cava is normal in size with greater than 50% respiratory variability, suggesting right atrial pressure of 3 mmHg.  IAS/Shunts: No atrial level shunt detected by color flow Doppler.   LEFT VENTRICLE PLAX 2D LVIDd:         3.50 cm   Diastology LVIDs:         2.40 cm   LV e' medial:    5.55 cm/s LV PW:         1.60 cm   LV E/e' medial:  21.8 LV IVS:        1.70 cm   LV e' lateral:   9.36 cm/s LVOT diam:     2.00 cm   LV E/e' lateral: 12.9 LV SV:         104 LV SV Index:   59 LVOT Area:     3.14 cm   RIGHT VENTRICLE            IVC RV S prime:     9.79 cm/s  IVC diam: 1.00 cm TAPSE (M-mode): 1.2 cm RVSP:           31.9 mmHg  LEFT ATRIUM           Index        RIGHT ATRIUM           Index LA diam:      4.30 cm 2.44 cm/m   RA Pressure: 3.00 mmHg LA Vol (A2C): 47.8 ml 27.08 ml/m  RA Area:     14.80 cm LA Vol (A4C): 59.3 ml 33.59 ml/m  RA Volume:   35.70 ml  20.22 ml/m AORTIC VALVE AV Area (Vmax):    2.19 cm AV Area (Vmean):   2.33 cm AV Area (VTI):     2.47 cm AV Vmax:           181.00 cm/s AV Vmean:  122.000 cm/s AV VTI:             0.420 m AV Peak Grad:      13.1 mmHg AV Mean Grad:      6.7 mmHg LVOT Vmax:         126.00 cm/s LVOT Vmean:        90.400 cm/s LVOT VTI:          0.330 m LVOT/AV VTI ratio: 0.79  AORTA Ao Root diam: 3.60 cm  MITRAL VALVE                TRICUSPID VALVE MV Area (PHT): 1.96 cm     TR Peak grad:   28.9 mmHg MV Decel Time: 387 msec     TR Vmax:        269.00 cm/s MV E velocity: 121.00 cm/s  Estimated RAP:  3.00 mmHg MV A velocity: 146.00 cm/s  RVSP:           31.9 mmHg MV E/A ratio:  0.83 SHUNTS Systemic VTI:  0.33 m Systemic Diam: 2.00 cm  Clearnce Hasten Electronically signed by Clearnce Hasten Signature Date/Time: 09/12/2023/12:17:59 PM    Final   TEE  ECHO TEE 12/05/2016  Narrative *[place]* *[facility]* [address] [place], [place] [address] [phone]  ------------------------------------------------------------------- Transesophageal Echocardiography  (Report amended )  Patient:    Brett [name], Brett [name] MR #:       [id number] Study Date: 12/05/2016 Gender:     M Age:        75 Height:     169.5 cm Weight:     61.7 kg BSA:        1.7 m^2 Pt. Status: Room:       4I34V  ADMITTING    Tressie Stalker, M.D. ATTENDING    Tressie Stalker, M.D. SONOGRAPHER  Lavenia Atlas, RCS ORDERING     Tonny Bollman, MD REFERRING    Tonny Bollman, MD PERFORMING   Tobias Alexander, M.D. SONOGRAPHER  [facility]  cc:  ------------------------------------------------------------------- LV EF: 60% -   65%  ------------------------------------------------------------------- Indications:      Aortic stenosis 424.1.  ------------------------------------------------------------------- History:   PMH:  s/p CABG x 3.  Risk factors:  Current tobacco use. Hypertension. Dyslipidemia.  ------------------------------------------------------------------- Study Conclusions  - Left ventricle: Wall thickness was increased in a pattern of severe LVH.  Systolic function was normal. The estimated ejection fraction was in the range of 60% to 65%. Wall motion was normal; there were no regional wall motion abnormalities. - Aortic valve: Trileaflet; severely thickened, severely calcified leaflets. Cusp separation was severely reduced. There was critical stenosis. There was trivial regurgitation. - Aorta: There was severe non-mobile atheroma. - Aortic root: The aortic root was not dilated. - Ascending aorta: The ascending aorta was normal in size. - Mitral valve: There was mild regurgitation. - Left atrium: The atrium was dilated. No evidence of thrombus in the atrial cavity or appendage. No evidence of thrombus in the atrial cavity or appendage. The appendage was morphologically a left appendage, multilobulated, and of normal size. Emptying velocity was normal. - Right ventricle: Systolic function was normal. - Right atrium: No evidence of thrombus in the atrial cavity or appendage. - Tricuspid valve: There was mild regurgitation. - Pulmonic valve: There was trivial regurgitation. - Pericardium, extracardiac: There was no pericardial effusion.  Impressions:  - This was a periprocedural echocardiogram during a TAVR procedure.  Native aortic valve was severely calcified and thickened with severely restricted  leaflet opening. Aortic stenosis was critical with peak/mean gradients 106/61 mmHg. Trivial aortic regurgitation. Mild mitral regurgitation, mild tricuspid regurgitation.  A 26 mm Edwards-SAPIEN 3 valve was successfully deployed in the aortic position. Post deployment transaortic gradients were peak/mean 10/5 mmHg. AVA 3.3 cm2. There was no central aortic regurgitation or paravalvular leak. Mitral and tricuspid regurgitations were unchanged. There was no pericardial effusion pre and post procedure.  ------------------------------------------------------------------- Study data:   Study status:  Routine.  Consent:  The  risks, benefits, and alternatives to the procedure were explained to the patient and informed consent was obtained.  Procedure:  The patient reported no pain pre or post test. Initial setup. The patient was brought to the laboratory. Surface ECG leads were monitored. Sedation. Deep sedation was administered by anesthesiology staff. Sedation. Conscious sedation was administered. Transesophageal echocardiography. Topical anesthesia was obtained using viscous lidocaine. An adult multiplane transesophageal probe was inserted by the attending cardiologistwithout difficulty. Image quality was adequate.  Study completion:  The patient tolerated the procedure well. There were no complications.          Diagnostic transesophageal echocardiography.  2D and color Doppler. Birthdate:  Patient birthdate: January 10, 1941.  Age:  Patient is 83 yr old.  Sex:  Gender: male.    BMI: 21.5 kg/m^2.  Blood pressure: 184/69  Patient status:  Inpatient.  Study date:  Study date: 12/05/2016. Study time: 07:52 AM.  Location:  Operating room.  -------------------------------------------------------------------  ------------------------------------------------------------------- Left ventricle:   Wall thickness was increased in a pattern of severe LVH.   Systolic function was normal. The estimated ejection fraction was in the range of 60% to 65%. Wall motion was normal; there were no regional wall motion abnormalities.  ------------------------------------------------------------------- Aortic valve:   Trileaflet; severely thickened, severely calcified leaflets. Cusp separation was severely reduced.  Doppler:   There was critical stenosis.   There was trivial regurgitation.    Mean gradient (S): 41 mm Hg. Peak gradient (S): 62 mm Hg.  ------------------------------------------------------------------- Aorta:  There was severe non-mobile atheroma. There was no evidence for dissection. Aortic root: The aortic root  was not dilated. Ascending aorta: The ascending aorta was normal in size. Descending aorta: The descending aorta was normal in size.  ------------------------------------------------------------------- Mitral valve:   Structurally normal valve.   Leaflet separation was normal.  Doppler:  There was mild regurgitation.  ------------------------------------------------------------------- Left atrium:  The atrium was dilated.  No evidence of thrombus in the atrial cavity or appendage.  No evidence of thrombus in the atrial cavity or appendage. The appendage was morphologically a left appendage, multilobulated, and of normal size. Emptying velocity was normal.  ------------------------------------------------------------------- Right ventricle:  The cavity size was normal. Wall thickness was normal. Systolic function was normal.  ------------------------------------------------------------------- Pulmonic valve:    Structurally normal valve.    Doppler:  There was trivial regurgitation.  ------------------------------------------------------------------- Tricuspid valve:   Structurally normal valve.   Leaflet separation was normal.  Doppler:  There was mild regurgitation.  ------------------------------------------------------------------- Pulmonary artery:   The main pulmonary artery was normal-sized.  ------------------------------------------------------------------- Right atrium:  The atrium was normal in size.  No evidence of thrombus in the atrial cavity or appendage. The appendage was morphologically a right appendage.  ------------------------------------------------------------------- Pericardium:  There was no pericardial effusion.  ------------------------------------------------------------------- Pre bypass:  Post bypass: Pre bypass: Post bypass:  ------------------------------------------------------------------- Measurements  Aortic valve                        Value Aortic  valve peak velocity, S      394   cm/s Aortic valve mean velocity, S      269   cm/s Aortic valve VTI, S                104   cm Aortic mean gradient, S            41    mm Hg Aortic peak gradient, S            62    mm Hg  Legend: (L)  and  (H)  mark values outside specified reference range.  ------------------------------------------------------------------- Quay Burow, M.D. 2018-05-14T17:22:49    CT SCANS  CT CORONARY MORPH W/CTA COR W/SCORE 11/27/2016  Addendum 11/27/2016  2:43 PM ADDENDUM REPORT: 11/27/2016 14:40  CLINICAL DATA:  83 year old male with severe aortic stenosis.  EXAM: Cardiac TAVR CT  TECHNIQUE: The patient was scanned on a Philips 256 scanner. A 120 kV retrospective scan was triggered in the descending thoracic aorta at 111 HU's. Gantry rotation speed was 270 msecs and collimation was .9 mm. 10 mg of iv metoprolol and no nitro were given. The 3D data set was reconstructed in 5% intervals of the R-R cycle. Systolic and diastolic phases were analyzed on a dedicated work station using MPR, MIP and VRT modes. The patient received 80 cc of contrast.  FINDINGS: Aortic Valve: Trileaflet, moderately calcified and thickened with severely restricted leaflet opening. Asymmetric calcification of the non-coronary leaflet. No calcifications extending into the LVOT.  Aorta: Normal caliber. Moderate to severe diffuse calcifications and atherosclerosis throughout the thoracic aorta. No dissection.  Sinotubular Junction:  28 x 27 mm  Ascending Thoracic Aorta:  30 x 30 mm  Aortic Arch:  25 x 23 mm  Descending Thoracic Aorta:  23 x 21 mm  Sinus of Valsalva Measurements:  Non-coronary:  33 mm  Right -coronary:  32 mm  Left -coronary:  34 mm  Coronary Artery Height above Annulus:  Left Main:  14 mm  Right Coronary:  16 mm  Virtual Basal Annulus Measurements:  Maximum/Minimum Diameter:  26 x 21 mm  Perimeter:  75  mm  Area:  426 mm  Optimum Fluoroscopic Angle for Delivery:  LAO 3 CAU 5  IMPRESSION: 1. Trileaflet, moderately calcified and thickened aortic valve with severely restricted leaflet opening. There is asymmetric calcification of the non-coronary leaflet and no calcifications extending into the LVOT. Annular measurements suitable for delivery of a 26 mm Edwards-SAPIEN 3 valve given diameter sizes.  2.  Sufficient annulus to coronary distance.  3. Optimum Fluoroscopic Angle for Delivery:  LAO 3 CAU 5  Tobias Alexander   Electronically Signed By: Tobias Alexander On: 11/27/2016 14:40  Narrative EXAM: OVER-READ INTERPRETATION  CT CHEST  The following report is an over-read performed by radiologist Dr. Noe Gens [facility] on 11/27/2016. This over-read does not include interpretation of cardiac or coronary anatomy or pathology. The coronary CTA interpretation by the cardiologist is attached.  COMPARISON:  04/06/2016  FINDINGS: Cardiovascular: Diffuse aortic calcifications. No evidence of aortic aneurysm. Heart is mildly enlarged.  Mediastinum/Nodes: Small scattered mediastinal lymph nodes, none pathologically enlarged or changed since prior study, and likely reactive.  Lungs/Pleura: Emphysematous changes in the lungs. No confluent airspace opacity or suspicious nodule. No effusions.  Upper Abdomen: Imaging into the upper abdomen shows no acute findings.  Musculoskeletal: Chest wall soft tissues are unremarkable. No acute bony abnormality.  IMPRESSION: Diffuse calcifications throughout the visualized aorta.  No aneurysm.  Mild cardiomegaly.  Borderline sized mediastinal lymph nodes, stable since prior study, likely reactive.  Emphysema.  Electronically Signed: By: Charlett Nose M.D. On: 11/27/2016 10:09     ______________________________________________________________________________________________      EKG:        Recent  Labs: 10/10/2023: ALT 22; BUN 16; Creatinine, Ser 1.50; Hemoglobin 13.3; Platelets 261; Potassium 4.5; Sodium 144; TSH 1.030  Recent Lipid Panel    Component Value Date/Time   CHOL 132 08/23/2018 1215   TRIG 100.0 08/23/2018 1215   TRIG 93 06/25/2006 0901   HDL 42.60 08/23/2018 1215   CHOLHDL 3 08/23/2018 1215   VLDL 20.0 08/23/2018 1215   LDLCALC 69 08/23/2018 1215   LDLDIRECT 65.0 07/20/2017 1058     Risk Assessment/Calculations:                Physical Exam:    VS:  BP 100/60   Pulse 64   Ht 5\' 8"  (1.727 m)   Wt 140 lb 3.2 oz (63.6 kg)   SpO2 97%   BMI 21.32 kg/m     Wt Readings from Last 3 Encounters:  11/12/23 140 lb 3.2 oz (63.6 kg)  10/10/23 144 lb (65.3 kg)  10/06/22 147 lb 3.2 oz (66.8 kg)     GEN:  Well nourished, well developed in no acute distress HEENT: Normal NECK: No JVD; No carotid bruits LYMPHATICS: No lymphadenopathy CARDIAC: RRR, no murmurs, rubs, gallops RESPIRATORY:  Clear to auscultation without rales, wheezing or rhonchi  ABDOMEN: Soft, non-tender, non-distended MUSCULOSKELETAL:  No edema; No deformity  SKIN: Warm and dry NEUROLOGIC:  Alert and oriented x 3 PSYCHIATRIC:  Normal affect   Assessment & Plan Essential hypertension Blood pressure is well-controlled.  He continues on amlodipine and metoprolol, tolerating meds well.  No changes made today. S/P CABG x 5 He remains clinically stable without symptoms of angina.  He is on aspirin for antiplatelet therapy and high intensity statin drug. S/P TAVR (transcatheter aortic valve replacement) He continues to demonstrate normal function of his TAVR prosthesis.  I reviewed his most recent echo outlined above with normal LVEF of 60 to 65%, normal RV function, the mean transaortic gradient of 7 mmHg, and no paravalvular regurgitation.  I will just follow him clinically moving forward unless something changes with his exam or symptoms.  Plan to see him back in 1 year.             Medication Adjustments/Labs and Tests Ordered: Current medicines are reviewed at length with the patient today.  Concerns regarding medicines are outlined above.  Orders Placed This Encounter  Procedures   EKG 12-Lead   No orders of the defined types were placed in this encounter.   There are no Patient Instructions on file for this visit.   Signed, Tonny Bollman, MD  11/12/2023 10:52 AM    [facility]

## 2023-11-12 NOTE — Assessment & Plan Note (Signed)
 Blood pressure is well-controlled.  He continues on amlodipine and metoprolol, tolerating meds well.  No changes made today.

## 2023-11-13 ENCOUNTER — Ambulatory Visit: Payer: Self-pay | Admitting: Neurology

## 2023-11-13 ENCOUNTER — Other Ambulatory Visit: Payer: Self-pay | Admitting: Cardiovascular Disease

## 2023-11-16 ENCOUNTER — Encounter (HOSPITAL_COMMUNITY)

## 2023-11-27 DIAGNOSIS — N401 Enlarged prostate with lower urinary tract symptoms: Secondary | ICD-10-CM | POA: Diagnosis not present

## 2023-11-27 DIAGNOSIS — E441 Mild protein-calorie malnutrition: Secondary | ICD-10-CM | POA: Diagnosis not present

## 2023-11-27 DIAGNOSIS — E785 Hyperlipidemia, unspecified: Secondary | ICD-10-CM | POA: Diagnosis not present

## 2023-11-27 DIAGNOSIS — I25118 Atherosclerotic heart disease of native coronary artery with other forms of angina pectoris: Secondary | ICD-10-CM | POA: Diagnosis not present

## 2023-11-27 DIAGNOSIS — I7102 Dissection of abdominal aorta: Secondary | ICD-10-CM | POA: Diagnosis not present

## 2023-11-27 DIAGNOSIS — I739 Peripheral vascular disease, unspecified: Secondary | ICD-10-CM | POA: Diagnosis not present

## 2023-11-27 DIAGNOSIS — F0390 Unspecified dementia without behavioral disturbance: Secondary | ICD-10-CM | POA: Diagnosis not present

## 2023-11-27 DIAGNOSIS — R2681 Unsteadiness on feet: Secondary | ICD-10-CM | POA: Diagnosis not present

## 2023-11-27 DIAGNOSIS — I10 Essential (primary) hypertension: Secondary | ICD-10-CM | POA: Diagnosis not present

## 2023-11-27 DIAGNOSIS — R7301 Impaired fasting glucose: Secondary | ICD-10-CM | POA: Diagnosis not present

## 2023-11-27 DIAGNOSIS — M810 Age-related osteoporosis without current pathological fracture: Secondary | ICD-10-CM | POA: Diagnosis not present

## 2023-11-27 DIAGNOSIS — J438 Other emphysema: Secondary | ICD-10-CM | POA: Diagnosis not present

## 2023-12-04 ENCOUNTER — Encounter (HOSPITAL_COMMUNITY)

## 2023-12-04 DIAGNOSIS — J438 Other emphysema: Secondary | ICD-10-CM | POA: Diagnosis not present

## 2023-12-04 DIAGNOSIS — I10 Essential (primary) hypertension: Secondary | ICD-10-CM | POA: Diagnosis not present

## 2023-12-04 DIAGNOSIS — D500 Iron deficiency anemia secondary to blood loss (chronic): Secondary | ICD-10-CM | POA: Diagnosis not present

## 2023-12-04 DIAGNOSIS — F03B0 Unspecified dementia, moderate, without behavioral disturbance, psychotic disturbance, mood disturbance, and anxiety: Secondary | ICD-10-CM | POA: Diagnosis not present

## 2023-12-04 DIAGNOSIS — N39498 Other specified urinary incontinence: Secondary | ICD-10-CM | POA: Diagnosis not present

## 2023-12-04 DIAGNOSIS — N401 Enlarged prostate with lower urinary tract symptoms: Secondary | ICD-10-CM | POA: Diagnosis not present

## 2023-12-04 DIAGNOSIS — Z952 Presence of prosthetic heart valve: Secondary | ICD-10-CM | POA: Diagnosis not present

## 2023-12-04 DIAGNOSIS — E785 Hyperlipidemia, unspecified: Secondary | ICD-10-CM | POA: Diagnosis not present

## 2023-12-04 DIAGNOSIS — Z556 Problems related to health literacy: Secondary | ICD-10-CM | POA: Diagnosis not present

## 2023-12-04 DIAGNOSIS — M810 Age-related osteoporosis without current pathological fracture: Secondary | ICD-10-CM | POA: Diagnosis not present

## 2023-12-04 DIAGNOSIS — Z79899 Other long term (current) drug therapy: Secondary | ICD-10-CM | POA: Diagnosis not present

## 2023-12-04 DIAGNOSIS — M1990 Unspecified osteoarthritis, unspecified site: Secondary | ICD-10-CM | POA: Diagnosis not present

## 2023-12-04 DIAGNOSIS — Z7982 Long term (current) use of aspirin: Secondary | ICD-10-CM | POA: Diagnosis not present

## 2023-12-04 DIAGNOSIS — E441 Mild protein-calorie malnutrition: Secondary | ICD-10-CM | POA: Diagnosis not present

## 2023-12-04 DIAGNOSIS — S62109D Fracture of unspecified carpal bone, unspecified wrist, subsequent encounter for fracture with routine healing: Secondary | ICD-10-CM | POA: Diagnosis not present

## 2023-12-04 DIAGNOSIS — I951 Orthostatic hypotension: Secondary | ICD-10-CM | POA: Diagnosis not present

## 2023-12-04 DIAGNOSIS — I739 Peripheral vascular disease, unspecified: Secondary | ICD-10-CM | POA: Diagnosis not present

## 2023-12-04 DIAGNOSIS — K219 Gastro-esophageal reflux disease without esophagitis: Secondary | ICD-10-CM | POA: Diagnosis not present

## 2023-12-04 DIAGNOSIS — Z6822 Body mass index (BMI) 22.0-22.9, adult: Secondary | ICD-10-CM | POA: Diagnosis not present

## 2023-12-04 DIAGNOSIS — J302 Other seasonal allergic rhinitis: Secondary | ICD-10-CM | POA: Diagnosis not present

## 2023-12-04 DIAGNOSIS — I25118 Atherosclerotic heart disease of native coronary artery with other forms of angina pectoris: Secondary | ICD-10-CM | POA: Diagnosis not present

## 2023-12-04 DIAGNOSIS — Z87440 Personal history of urinary (tract) infections: Secondary | ICD-10-CM | POA: Diagnosis not present

## 2023-12-04 DIAGNOSIS — Z951 Presence of aortocoronary bypass graft: Secondary | ICD-10-CM | POA: Diagnosis not present

## 2023-12-04 DIAGNOSIS — H409 Unspecified glaucoma: Secondary | ICD-10-CM | POA: Diagnosis not present

## 2023-12-04 DIAGNOSIS — I700 Atherosclerosis of aorta: Secondary | ICD-10-CM | POA: Diagnosis not present

## 2023-12-10 DIAGNOSIS — I739 Peripheral vascular disease, unspecified: Secondary | ICD-10-CM | POA: Diagnosis not present

## 2023-12-10 DIAGNOSIS — J438 Other emphysema: Secondary | ICD-10-CM | POA: Diagnosis not present

## 2023-12-10 DIAGNOSIS — E441 Mild protein-calorie malnutrition: Secondary | ICD-10-CM | POA: Diagnosis not present

## 2023-12-10 DIAGNOSIS — I10 Essential (primary) hypertension: Secondary | ICD-10-CM | POA: Diagnosis not present

## 2023-12-10 DIAGNOSIS — I700 Atherosclerosis of aorta: Secondary | ICD-10-CM | POA: Diagnosis not present

## 2023-12-10 DIAGNOSIS — I951 Orthostatic hypotension: Secondary | ICD-10-CM | POA: Diagnosis not present

## 2023-12-11 DIAGNOSIS — I951 Orthostatic hypotension: Secondary | ICD-10-CM | POA: Diagnosis not present

## 2023-12-11 DIAGNOSIS — I700 Atherosclerosis of aorta: Secondary | ICD-10-CM | POA: Diagnosis not present

## 2023-12-11 DIAGNOSIS — J438 Other emphysema: Secondary | ICD-10-CM | POA: Diagnosis not present

## 2023-12-11 DIAGNOSIS — I10 Essential (primary) hypertension: Secondary | ICD-10-CM | POA: Diagnosis not present

## 2023-12-11 DIAGNOSIS — I739 Peripheral vascular disease, unspecified: Secondary | ICD-10-CM | POA: Diagnosis not present

## 2023-12-11 DIAGNOSIS — E441 Mild protein-calorie malnutrition: Secondary | ICD-10-CM | POA: Diagnosis not present

## 2023-12-12 DIAGNOSIS — I739 Peripheral vascular disease, unspecified: Secondary | ICD-10-CM | POA: Diagnosis not present

## 2023-12-12 DIAGNOSIS — J438 Other emphysema: Secondary | ICD-10-CM | POA: Diagnosis not present

## 2023-12-12 DIAGNOSIS — I951 Orthostatic hypotension: Secondary | ICD-10-CM | POA: Diagnosis not present

## 2023-12-12 DIAGNOSIS — E441 Mild protein-calorie malnutrition: Secondary | ICD-10-CM | POA: Diagnosis not present

## 2023-12-12 DIAGNOSIS — I10 Essential (primary) hypertension: Secondary | ICD-10-CM | POA: Diagnosis not present

## 2023-12-12 DIAGNOSIS — I700 Atherosclerosis of aorta: Secondary | ICD-10-CM | POA: Diagnosis not present

## 2023-12-17 DIAGNOSIS — I700 Atherosclerosis of aorta: Secondary | ICD-10-CM | POA: Diagnosis not present

## 2023-12-17 DIAGNOSIS — I951 Orthostatic hypotension: Secondary | ICD-10-CM | POA: Diagnosis not present

## 2023-12-17 DIAGNOSIS — I739 Peripheral vascular disease, unspecified: Secondary | ICD-10-CM | POA: Diagnosis not present

## 2023-12-17 DIAGNOSIS — I10 Essential (primary) hypertension: Secondary | ICD-10-CM | POA: Diagnosis not present

## 2023-12-17 DIAGNOSIS — J438 Other emphysema: Secondary | ICD-10-CM | POA: Diagnosis not present

## 2023-12-17 DIAGNOSIS — E441 Mild protein-calorie malnutrition: Secondary | ICD-10-CM | POA: Diagnosis not present

## 2023-12-19 DIAGNOSIS — E441 Mild protein-calorie malnutrition: Secondary | ICD-10-CM | POA: Diagnosis not present

## 2023-12-19 DIAGNOSIS — M810 Age-related osteoporosis without current pathological fracture: Secondary | ICD-10-CM | POA: Diagnosis not present

## 2023-12-19 DIAGNOSIS — E785 Hyperlipidemia, unspecified: Secondary | ICD-10-CM | POA: Diagnosis not present

## 2023-12-19 DIAGNOSIS — F03B0 Unspecified dementia, moderate, without behavioral disturbance, psychotic disturbance, mood disturbance, and anxiety: Secondary | ICD-10-CM | POA: Diagnosis not present

## 2023-12-19 DIAGNOSIS — J438 Other emphysema: Secondary | ICD-10-CM | POA: Diagnosis not present

## 2023-12-19 DIAGNOSIS — N401 Enlarged prostate with lower urinary tract symptoms: Secondary | ICD-10-CM | POA: Diagnosis not present

## 2023-12-19 DIAGNOSIS — D500 Iron deficiency anemia secondary to blood loss (chronic): Secondary | ICD-10-CM | POA: Diagnosis not present

## 2023-12-19 DIAGNOSIS — I25118 Atherosclerotic heart disease of native coronary artery with other forms of angina pectoris: Secondary | ICD-10-CM | POA: Diagnosis not present

## 2023-12-19 DIAGNOSIS — K219 Gastro-esophageal reflux disease without esophagitis: Secondary | ICD-10-CM | POA: Diagnosis not present

## 2023-12-19 DIAGNOSIS — I10 Essential (primary) hypertension: Secondary | ICD-10-CM | POA: Diagnosis not present

## 2023-12-19 DIAGNOSIS — N39498 Other specified urinary incontinence: Secondary | ICD-10-CM | POA: Diagnosis not present

## 2023-12-19 DIAGNOSIS — Z79899 Other long term (current) drug therapy: Secondary | ICD-10-CM | POA: Diagnosis not present

## 2023-12-24 DIAGNOSIS — I739 Peripheral vascular disease, unspecified: Secondary | ICD-10-CM | POA: Diagnosis not present

## 2023-12-24 DIAGNOSIS — I10 Essential (primary) hypertension: Secondary | ICD-10-CM | POA: Diagnosis not present

## 2023-12-24 DIAGNOSIS — J438 Other emphysema: Secondary | ICD-10-CM | POA: Diagnosis not present

## 2023-12-24 DIAGNOSIS — E441 Mild protein-calorie malnutrition: Secondary | ICD-10-CM | POA: Diagnosis not present

## 2023-12-24 DIAGNOSIS — I700 Atherosclerosis of aorta: Secondary | ICD-10-CM | POA: Diagnosis not present

## 2023-12-24 DIAGNOSIS — I951 Orthostatic hypotension: Secondary | ICD-10-CM | POA: Diagnosis not present

## 2023-12-31 ENCOUNTER — Other Ambulatory Visit: Payer: Self-pay | Admitting: Cardiovascular Disease

## 2024-01-03 DIAGNOSIS — N401 Enlarged prostate with lower urinary tract symptoms: Secondary | ICD-10-CM | POA: Diagnosis not present

## 2024-01-03 DIAGNOSIS — Z87440 Personal history of urinary (tract) infections: Secondary | ICD-10-CM | POA: Diagnosis not present

## 2024-01-03 DIAGNOSIS — Z79899 Other long term (current) drug therapy: Secondary | ICD-10-CM | POA: Diagnosis not present

## 2024-01-03 DIAGNOSIS — I25118 Atherosclerotic heart disease of native coronary artery with other forms of angina pectoris: Secondary | ICD-10-CM | POA: Diagnosis not present

## 2024-01-03 DIAGNOSIS — Z7982 Long term (current) use of aspirin: Secondary | ICD-10-CM | POA: Diagnosis not present

## 2024-01-03 DIAGNOSIS — M810 Age-related osteoporosis without current pathological fracture: Secondary | ICD-10-CM | POA: Diagnosis not present

## 2024-01-03 DIAGNOSIS — Z556 Problems related to health literacy: Secondary | ICD-10-CM | POA: Diagnosis not present

## 2024-01-03 DIAGNOSIS — E441 Mild protein-calorie malnutrition: Secondary | ICD-10-CM | POA: Diagnosis not present

## 2024-01-03 DIAGNOSIS — H409 Unspecified glaucoma: Secondary | ICD-10-CM | POA: Diagnosis not present

## 2024-01-03 DIAGNOSIS — D500 Iron deficiency anemia secondary to blood loss (chronic): Secondary | ICD-10-CM | POA: Diagnosis not present

## 2024-01-03 DIAGNOSIS — I739 Peripheral vascular disease, unspecified: Secondary | ICD-10-CM | POA: Diagnosis not present

## 2024-01-03 DIAGNOSIS — S62109D Fracture of unspecified carpal bone, unspecified wrist, subsequent encounter for fracture with routine healing: Secondary | ICD-10-CM | POA: Diagnosis not present

## 2024-01-03 DIAGNOSIS — I951 Orthostatic hypotension: Secondary | ICD-10-CM | POA: Diagnosis not present

## 2024-01-03 DIAGNOSIS — I700 Atherosclerosis of aorta: Secondary | ICD-10-CM | POA: Diagnosis not present

## 2024-01-03 DIAGNOSIS — K219 Gastro-esophageal reflux disease without esophagitis: Secondary | ICD-10-CM | POA: Diagnosis not present

## 2024-01-03 DIAGNOSIS — E785 Hyperlipidemia, unspecified: Secondary | ICD-10-CM | POA: Diagnosis not present

## 2024-01-03 DIAGNOSIS — J438 Other emphysema: Secondary | ICD-10-CM | POA: Diagnosis not present

## 2024-01-03 DIAGNOSIS — I10 Essential (primary) hypertension: Secondary | ICD-10-CM | POA: Diagnosis not present

## 2024-01-03 DIAGNOSIS — Z6822 Body mass index (BMI) 22.0-22.9, adult: Secondary | ICD-10-CM | POA: Diagnosis not present

## 2024-01-03 DIAGNOSIS — J302 Other seasonal allergic rhinitis: Secondary | ICD-10-CM | POA: Diagnosis not present

## 2024-01-03 DIAGNOSIS — F03B0 Unspecified dementia, moderate, without behavioral disturbance, psychotic disturbance, mood disturbance, and anxiety: Secondary | ICD-10-CM | POA: Diagnosis not present

## 2024-01-03 DIAGNOSIS — M1990 Unspecified osteoarthritis, unspecified site: Secondary | ICD-10-CM | POA: Diagnosis not present

## 2024-01-03 DIAGNOSIS — Z951 Presence of aortocoronary bypass graft: Secondary | ICD-10-CM | POA: Diagnosis not present

## 2024-01-03 DIAGNOSIS — Z952 Presence of prosthetic heart valve: Secondary | ICD-10-CM | POA: Diagnosis not present

## 2024-01-03 DIAGNOSIS — N39498 Other specified urinary incontinence: Secondary | ICD-10-CM | POA: Diagnosis not present

## 2024-01-04 DIAGNOSIS — I739 Peripheral vascular disease, unspecified: Secondary | ICD-10-CM | POA: Diagnosis not present

## 2024-01-04 DIAGNOSIS — J438 Other emphysema: Secondary | ICD-10-CM | POA: Diagnosis not present

## 2024-01-04 DIAGNOSIS — I951 Orthostatic hypotension: Secondary | ICD-10-CM | POA: Diagnosis not present

## 2024-01-04 DIAGNOSIS — I10 Essential (primary) hypertension: Secondary | ICD-10-CM | POA: Diagnosis not present

## 2024-01-04 DIAGNOSIS — E441 Mild protein-calorie malnutrition: Secondary | ICD-10-CM | POA: Diagnosis not present

## 2024-01-04 DIAGNOSIS — I700 Atherosclerosis of aorta: Secondary | ICD-10-CM | POA: Diagnosis not present

## 2024-01-09 DIAGNOSIS — I700 Atherosclerosis of aorta: Secondary | ICD-10-CM | POA: Diagnosis not present

## 2024-01-09 DIAGNOSIS — E441 Mild protein-calorie malnutrition: Secondary | ICD-10-CM | POA: Diagnosis not present

## 2024-01-09 DIAGNOSIS — J438 Other emphysema: Secondary | ICD-10-CM | POA: Diagnosis not present

## 2024-01-09 DIAGNOSIS — I739 Peripheral vascular disease, unspecified: Secondary | ICD-10-CM | POA: Diagnosis not present

## 2024-01-09 DIAGNOSIS — I10 Essential (primary) hypertension: Secondary | ICD-10-CM | POA: Diagnosis not present

## 2024-01-09 DIAGNOSIS — I951 Orthostatic hypotension: Secondary | ICD-10-CM | POA: Diagnosis not present

## 2024-01-10 ENCOUNTER — Other Ambulatory Visit (HOSPITAL_COMMUNITY): Payer: Self-pay | Admitting: *Deleted

## 2024-01-10 ENCOUNTER — Telehealth: Payer: Self-pay

## 2024-01-10 NOTE — Telephone Encounter (Signed)
 Auth Submission: NO AUTH NEEDED Site of care: Site of care: [facility] Payer: Medicare A/B with Emblem supplement Medication & CPT/J Code(s) submitted: Prolia  (Denosumab ) [id number] Route of submission (phone, fax, portal):  Phone # Fax # Auth type: Buy/Bill PB Units/visits requested: 60mg  x 2 doses Reference number:  Approval from: 08/08/23 to 09/06/24

## 2024-01-11 ENCOUNTER — Ambulatory Visit (HOSPITAL_COMMUNITY)
Admission: RE | Admit: 2024-01-11 | Discharge: 2024-01-11 | Disposition: A | Discharge status: Home / Self Care | Source: Ambulatory Visit | Attending: Internal Medicine | Admitting: Internal Medicine

## 2024-01-11 DIAGNOSIS — M810 Age-related osteoporosis without current pathological fracture: Secondary | ICD-10-CM | POA: Diagnosis not present

## 2024-01-11 MED ORDER — DENOSUMAB 60 MG/ML ~~LOC~~ SOSY
60.0000 mg | PREFILLED_SYRINGE | Freq: Once | SUBCUTANEOUS | Status: AC
  Administered 2024-01-11: 60 mg via SUBCUTANEOUS

## 2024-01-11 MED ORDER — DENOSUMAB 60 MG/ML ~~LOC~~ SOSY
PREFILLED_SYRINGE | SUBCUTANEOUS | Status: AC
  Filled 2024-01-11: qty 1

## 2024-01-15 DIAGNOSIS — E441 Mild protein-calorie malnutrition: Secondary | ICD-10-CM | POA: Diagnosis not present

## 2024-01-15 DIAGNOSIS — I951 Orthostatic hypotension: Secondary | ICD-10-CM | POA: Diagnosis not present

## 2024-01-15 DIAGNOSIS — I739 Peripheral vascular disease, unspecified: Secondary | ICD-10-CM | POA: Diagnosis not present

## 2024-01-15 DIAGNOSIS — J438 Other emphysema: Secondary | ICD-10-CM | POA: Diagnosis not present

## 2024-01-15 DIAGNOSIS — I10 Essential (primary) hypertension: Secondary | ICD-10-CM | POA: Diagnosis not present

## 2024-01-15 DIAGNOSIS — I700 Atherosclerosis of aorta: Secondary | ICD-10-CM | POA: Diagnosis not present

## 2024-01-23 DIAGNOSIS — I700 Atherosclerosis of aorta: Secondary | ICD-10-CM | POA: Diagnosis not present

## 2024-01-23 DIAGNOSIS — J438 Other emphysema: Secondary | ICD-10-CM | POA: Diagnosis not present

## 2024-01-23 DIAGNOSIS — I739 Peripheral vascular disease, unspecified: Secondary | ICD-10-CM | POA: Diagnosis not present

## 2024-01-23 DIAGNOSIS — I951 Orthostatic hypotension: Secondary | ICD-10-CM | POA: Diagnosis not present

## 2024-01-23 DIAGNOSIS — I10 Essential (primary) hypertension: Secondary | ICD-10-CM | POA: Diagnosis not present

## 2024-01-23 DIAGNOSIS — E441 Mild protein-calorie malnutrition: Secondary | ICD-10-CM | POA: Diagnosis not present

## 2024-01-28 DIAGNOSIS — I10 Essential (primary) hypertension: Secondary | ICD-10-CM | POA: Diagnosis not present

## 2024-01-28 DIAGNOSIS — E441 Mild protein-calorie malnutrition: Secondary | ICD-10-CM | POA: Diagnosis not present

## 2024-01-28 DIAGNOSIS — J438 Other emphysema: Secondary | ICD-10-CM | POA: Diagnosis not present

## 2024-01-28 DIAGNOSIS — I739 Peripheral vascular disease, unspecified: Secondary | ICD-10-CM | POA: Diagnosis not present

## 2024-01-28 DIAGNOSIS — I951 Orthostatic hypotension: Secondary | ICD-10-CM | POA: Diagnosis not present

## 2024-01-28 DIAGNOSIS — I700 Atherosclerosis of aorta: Secondary | ICD-10-CM | POA: Diagnosis not present

## 2024-03-25 ENCOUNTER — Ambulatory Visit: Admitting: Neurology

## 2024-03-25 VITALS — BP 100/70 | HR 91 | Wt 137.0 lb

## 2024-03-25 DIAGNOSIS — F0390 Unspecified dementia without behavioral disturbance: Secondary | ICD-10-CM | POA: Insufficient documentation

## 2024-03-25 DIAGNOSIS — G301 Alzheimer's disease with late onset: Secondary | ICD-10-CM | POA: Diagnosis not present

## 2024-03-25 DIAGNOSIS — F02B0 Dementia in other diseases classified elsewhere, moderate, without behavioral disturbance, psychotic disturbance, mood disturbance, and anxiety: Secondary | ICD-10-CM

## 2024-03-25 MED ORDER — MEMANTINE HCL 5 MG PO TABS: 5.0000 mg | ORAL_TABLET | Freq: Two times a day (BID) | ORAL | 5 refills | Status: DC

## 2024-03-25 NOTE — Progress Notes (Signed)
 Patient: Brett [name] Date of Birth: 03/12/1941  Reason for Visit: Follow up History from: Patient Primary Neurologist: Dohmeier   ASSESSMENT AND PLAN 83 y.o. year old male   1.  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  We may recommend reducing the dose to 5 mg if weight loss continues. - Closely monitor weight, increase intake - Has decided against antiamyloid medications, Amyloid PET scan - Recommend exercise, brain stimulating activity, management of vascular - Follow up in 6 months with me or Dr. Chalice  I personally spent a total of 45 minutes in the care of the patient today including preparing to see the patient, getting/reviewing separately obtained history, performing a medically appropriate exam/evaluation, counseling and educating, placing orders, documenting clinical information in the EHR, communicating results, and coordinating care.  HISTORY OF PRESENT ILLNESS: Today 03/25/24 MMSE 24/30 in March 2025. A+T+N+, E3/E4. He decided not to proceed with Amyloid PET scan, not interested in anti-amyloid medications. Lives in adult retirement community, only drives to the mail box or the dumpster to take the trash out. Does his own ADLs. Mentions his wife is always there if he needs helps. He is left alone for short periods. He doesn't do much, he likes to watch you tube. Doesn't have interest in much anymore. Feels like he deserves to be retired and do nothing. Wife manages pills. Poor short term memory, repeats himself, asks his wife the same questions, also having trouble with long term memory, doesn't remember their wedding or daughters birth. Sleeps very well, 9 PM- 11 AM daily. He had PT for 2 months, helped with his strength, but stop exercises when  program is done. Not much appetite, has lost 7 lbs, forgets if he ate. Mentions reducing the dose of Aricept. He has hiccups. Subtle changes in not wanting to shower, missing a spot with shaving. Is a former marine.   HISTORY  Brett [name] is a 83 y.o. male patient who is seen upon Dr Valentin, DO, referral on 10/10/2023 for a memory evaluation.     Chief concern according to patient :   when my wife asked me to do something , I forget- she comes home and asks about my day and I have forgotten what I was supposed to do and what I actually did .  His wife answered that he doesn't know if he a meal and what kind of food, whom he met, who called on him. He can watch the news and seems not to absorb the information.  Going on over the last  9 years, and first I chalked it up to forgetfulness, to open heart surgery ( ByPASS  2001- 5 vessels )  and  aortic stenosis  with Valve replacement ( TAVR 2019) ,  kidney stone surgery- perioperative massive  blood loss, complications of surgery, and  he had been reluctant to accept hearing aids.    I have the pleasure of seeing Brett [name]  on 10/10/23 a right -handed male with a memory concern.    Family medical /sleep history:  mother with PICK's disease, age 17 and moved to a memory unit.  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   and lives in a household with spouse the only daughter lives in [place],  [place] adult son from his previous marriage.  Tobacco use:  smoker from age 53 until age 59.   ETOH use :  Caffeine intake in form of Coffee( 2 cups ) Soda( coke  1 a day ) Tea ( .) no energy drinks Exercise in form of : no regular activity. .   Hobbies : watching TV,  sleeping much more.    Sleep habits are as follows: The patient's dinner time is between 6-6.30  PM. The patient goes to bed at 8.30 PM and continues to sleep for 12 hours, The preferred sleep  position is recliner, with the support of 1 pillow.  Dreams are reportedly frequent/vivid.   Belching frequently.   He/ reports not feeling refreshed or restored in AM, with symptoms such as dry mouth. Naps are taken frequently,.    REVIEW OF SYSTEMS: Out of a complete 14 system review of symptoms, the patient complains only of the following symptoms, and all other reviewed systems are negative.  See HPI  ALLERGIES: Allergies  Allergen Reactions   Levaquin  [Levofloxacin  In D5w]     HOME MEDICATIONS: Outpatient Medications Prior to Visit  Medication Sig Dispense Refill   acetaminophen  (TYLENOL ) 325 MG tablet Take 325 mg by mouth daily.     acidophilus (RISAQUAD) CAPS capsule Take 1 capsule by mouth daily.     alfuzosin  (UROXATRAL ) 10 MG 24 hr tablet Take 10 mg by mouth every evening.      amLODipine  (NORVASC ) 5 MG tablet Take 1 tablet (5 mg total) by mouth daily. 90 tablet 3   Ascorbic Acid (VITAMIN C PO) Take by mouth.     aspirin  EC 81 MG EC tablet Take 1 tablet (81 mg total) by mouth daily.     atorvastatin  (LIPITOR) 40 MG tablet TAKE 1 TABLET DAILY (Patient taking differently: Take 80 mg by mouth daily.) 90 tablet 3   Calcium  Citrate-Vitamin D (CALCIUM  + D PO) Take by mouth.     denosumab  (PROLIA ) 60 MG/ML SOSY injection See admin instructions.     donepezil (ARICEPT) 10 MG tablet Take 5 mg by mouth at bedtime.     finasteride  (PROSCAR ) 5 MG tablet Take 5 mg by mouth daily.  3   fluticasone (FLONASE) 50 MCG/ACT nasal spray USE 1 SPRAY IN EACH NOSTRILONCE DAILY     latanoprost (XALATAN) 0.005 % ophthalmic solution Place 1 drop into both eyes at bedtime.     loratadine (CLARITIN) 10 MG tablet Take 10 mg by mouth daily.     metoprolol  tartrate (LOPRESSOR ) 25 MG tablet TAKE 1 TABLET BY MOUTH TWICE A DAY 180 tablet 3   MULTIPLE VITAMIN PO Take 1 tablet by mouth daily.     omeprazole (PRILOSEC) 40 MG capsule Take 40 mg by mouth every morning.     sertraline (ZOLOFT) 50 MG tablet Take  by mouth.     tolterodine (DETROL LA) 4 MG 24 hr capsule Through urology     No facility-administered medications prior to visit.    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   26 mm Edwards Sapien 3 transcatheter heart valve placed via percutaneous right transfemoral approach    PAST SURGICAL HISTORY: Past Surgical History:  Procedure Laterality Date   BYPASS GRAFT  2006   CORONARY ARTERY BYPASS GRAFT  12/07/2004   CABG x5 Dr Dusty   CYSTOSCOPY WITH LITHOLAPAXY N/A 09/25/2017   Procedure: CYSTOSCOPY WITH LITHOLAPAXY;  Surgeon: Sherrilee Belvie CROME, MD;  Location: [facility];  Service: Urology;  Laterality: N/A;   CYSTOSCOPY WITH RETROGRADE PYELOGRAM, URETEROSCOPY AND STENT PLACEMENT Left 11/02/2017   Procedure: CYSTOSCOPY WITH RETROGRADE PYELOGRAM, LEFT URETEROSCOPY, LEFT STENT EXCHANGE;  Surgeon: Sherrilee Belvie CROME, MD;  Location: [facility];  Service: Urology;  Laterality: Left;   HOLMIUM LASER APPLICATION N/A 09/25/2017   Procedure: HOLMIUM LASER APPLICATION;  Surgeon: Sherrilee Belvie CROME, MD;  Location: [facility];  Service: Urology;  Laterality: N/A;   HOLMIUM LASER APPLICATION Left 10/11/2017   Procedure: HOLMIUM LASER APPLICATION;  Surgeon: Sherrilee Belvie CROME, MD;  Location: [facility];  Service: Urology;  Laterality: Left;   HOLMIUM LASER APPLICATION Left 11/02/2017   Procedure: HOLMIUM LASER APPLICATION;  Surgeon: Sherrilee Belvie CROME, MD;  Location: [facility];  Service: Urology;  Laterality: Left;   IR URETERAL STENT LEFT NEW ACCESS W/O SEP NEPHROSTOMY CATH  10/11/2017   NEPHROLITHOTOMY Left 10/11/2017   Procedure: NEPHROLITHOTOMY PERCUTANEOUS;  Surgeon: Sherrilee Belvie CROME, MD;  Location: [facility];  Service: Urology;  Laterality: Left;   RIGHT HEART CATH AND CORONARY/GRAFT ANGIOGRAPHY N/A 11/21/2016   Procedure: Right Heart Cath and Coronary/Graft Angiography;  Surgeon: Ozell Fell, MD;  Location: [facility] IN[facility]IVE CV [facility];  Service: Cardiovascular;  Laterality: N/A;   TEE WITHOUT CARDIOVERSION N/A 12/05/2016   Procedure: TRANSESOPHAGEAL ECHOCARDIOGRAM (TEE);  Surgeon: Ozell Fell, MD;  Location: [facility] OR;  Service: Open Heart Surgery;  Laterality: N/A;   TRANSCATHETER AORTIC VALVE REPLACEMENT, TRANSFEMORAL N/A 12/05/2016   Procedure: TRANSCATHETER AORTIC VALVE REPLACEMENT, TRANSFEMORAL;  Surgeon: Ozell Fell, MD;  Location: [facility] OR;  Service: Open Heart Surgery;  Laterality: N/A;    FAMILY HISTORY: Family History  Problem Relation Age of Onset   Dementia Mother    Kidney Stones Mother    Niemann-Pick disease Mother    Heart attack Father    Diabetes Maternal Grandfather    Colon cancer Neg Hx    Colon polyps Neg Hx    Rectal cancer Neg Hx    Stomach cancer Neg Hx     SOCIAL HISTORY: Social History   Socioeconomic History   Marital status: Married    Spouse name: Not on file   Number of children: 3   Years of education: Not on file   Highest education level: Not on file  Occupational History   Occupation: Retired  Tobacco Use   Smoking status: Former    Current packs/day: 0.00    Average packs/day: 1 pack/day for 40.0 years (40.0 ttl pk-yrs)    Types: Cigarettes    Start date: 01/01/1976    Quit date: 01/01/2016    Years since quitting: 8.2   Smokeless tobacco: Never  Vaping Use   Vaping status: Never Used  Substance and Sexual Activity   Alcohol  use:  No    Alcohol /week: 0.0 standard drinks of alcohol     Drug use: No   Sexual activity: Yes  Other Topics Concern   Not on file  Social History Narrative   Married 1988 2nd marriage. 1 child from 2nd marriage (1994). 2 children from first marriage. 3-4 grandkids-broken relationship with his son though.       Retired from [employer] after 36 years in 1999. 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    10/24/2023    1:36 PM 08/01/2018    2:47 PM 05/16/2017   10:41 AM  MMSE - Mini Mental State Exam  Orientation to time 4 5 5    Orientation to Place 3 5 5    Registration 3 3 3    Attention/ Calculation 5 5 5    Recall 0 3 3   Language- name 2 objects 2 2 2    Language- repeat 1 1 1   Language- follow 3 step command 3 3 3    Language- read & follow direction 1 1 1    Write a sentence 1 1 1    Copy design 1 1 1    Total score 24 30 30       Data saved with a previous flowsheet row definition      03/25/2024   11:03 AM 10/10/2023    2:19 PM  Montreal Cognitive Assessment   Visuospatial/ Executive (0/5) 4 4  Naming (0/3) 3 3  Attention: Read list of digits (0/2) 2 1  Attention: Read list of letters (0/1) 1 1  Attention: Serial 7 subtraction starting at 100 (0/3) 3 1  Language: Repeat phrase (0/2) 1 0  Language : Fluency (0/1) 1 1  Abstraction (0/2) 2 1  Delayed Recall (0/5) 2 0  Orientation (0/6) 2 4  Total 21 16   Generalized: Well developed, in no acute distress  Neurological examination  Mentation: Alert oriented to time, place,  history taking. Follows all commands speech and language fluent.  He is quiet, wife provides most history. Cranial nerve II-XII: Pupils were equal round reactive to light. Extraocular movements were full, visual field were full on confrontational test. Facial sensation and strength were normal.  Head turning and shoulder shrug  were normal and symmetric. Motor: The motor testing reveals 5 over 5 strength of all 4 extremities. Good symmetric motor tone is noted throughout.  Sensory: Sensory testing is intact to soft touch on all 4 extremities. No evidence of extinction is noted.  Coordination: Cerebellar testing reveals good finger-nose-finger and heel-to-shin bilaterally.  Gait and station: Gait is normal.  Reflexes: Deep tendon reflexes are symmetric and normal bilaterally.   DIAGNOSTIC DATA (LABS, IMAGING, TESTING) - I reviewed patient records, labs, notes, testing and imaging myself where available.  Lab Results  Component Value Date   WBC 9.0 10/10/2023   HGB 13.3 10/10/2023   HCT 38.4 10/10/2023   MCV 95  10/10/2023   PLT 261 10/10/2023      Component Value Date/Time   NA 144 10/10/2023 1534   K 4.5 10/10/2023 1534   CL 107 (H) 10/10/2023 1534   CO2 23 10/10/2023 1534   GLUCOSE 112 (H) 10/10/2023 1534   GLUCOSE 124 (H) 02/21/2019 1150   GLUCOSE 118 (H) 06/25/2006 0901   BUN 16 10/10/2023 1534   CREATININE 1.50 (H) 10/10/2023 1534   CALCIUM  9.1 10/10/2023 1534   PROT 6.1 10/10/2023 1534   ALBUMIN  4.1 10/10/2023 1534   AST 28 10/10/2023 1534   ALT 22 10/10/2023 1534   ALKPHOS 84 10/10/2023 1534   BILITOT 0.5 10/10/2023 1534   GFRNONAA 41 (L) 11/01/2017 0717   GFRAA 48 (L) 11/01/2017 0717   Lab Results  Component Value Date   CHOL 132 08/23/2018   HDL 42.60 08/23/2018   LDLCALC 69 08/23/2018   LDLDIRECT 65.0 07/20/2017   TRIG 100.0 08/23/2018   CHOLHDL 3 08/23/2018   Lab Results  Component Value Date   HGBA1C 5.6 12/01/2016   Lab Results  Component Value Date    VITAMINB12 922 (H) 08/23/2018   Lab Results  Component Value Date   TSH 1.030 10/10/2023   Lauraine Born, AGNP-C, DNP 03/25/2024, 11:22 AM [facility] [address], [address] [place], [place] [address] [phone]

## 2024-03-25 NOTE — Patient Instructions (Signed)
 Start taking Namenda  5 mg twice daily for memory, watch out of dizziness. Monitor the weight. Switch the Aricept to the morning to see if less hiccups at night. Recommend exercise, management of vascular risk factors, brain stimulating activity   Follow up in 6 months with me

## 2024-04-23 DIAGNOSIS — Z23 Encounter for immunization: Secondary | ICD-10-CM | POA: Diagnosis not present

## 2024-05-21 DIAGNOSIS — R3914 Feeling of incomplete bladder emptying: Secondary | ICD-10-CM | POA: Diagnosis not present

## 2024-05-21 DIAGNOSIS — R351 Nocturia: Secondary | ICD-10-CM | POA: Diagnosis not present

## 2024-05-21 DIAGNOSIS — R350 Frequency of micturition: Secondary | ICD-10-CM | POA: Diagnosis not present

## 2024-05-21 DIAGNOSIS — N3281 Overactive bladder: Secondary | ICD-10-CM | POA: Diagnosis not present

## 2024-05-21 DIAGNOSIS — N201 Calculus of ureter: Secondary | ICD-10-CM | POA: Diagnosis not present

## 2024-05-21 DIAGNOSIS — R3915 Urgency of urination: Secondary | ICD-10-CM | POA: Diagnosis not present

## 2024-05-21 DIAGNOSIS — R3912 Poor urinary stream: Secondary | ICD-10-CM | POA: Diagnosis not present

## 2024-05-21 DIAGNOSIS — N400 Enlarged prostate without lower urinary tract symptoms: Secondary | ICD-10-CM | POA: Diagnosis not present

## 2024-06-03 DIAGNOSIS — Z0189 Encounter for other specified special examinations: Secondary | ICD-10-CM | POA: Diagnosis not present

## 2024-06-03 DIAGNOSIS — Z125 Encounter for screening for malignant neoplasm of prostate: Secondary | ICD-10-CM | POA: Diagnosis not present

## 2024-06-03 DIAGNOSIS — E785 Hyperlipidemia, unspecified: Secondary | ICD-10-CM | POA: Diagnosis not present

## 2024-06-03 DIAGNOSIS — M810 Age-related osteoporosis without current pathological fracture: Secondary | ICD-10-CM | POA: Diagnosis not present

## 2024-06-03 DIAGNOSIS — I10 Essential (primary) hypertension: Secondary | ICD-10-CM | POA: Diagnosis not present

## 2024-06-03 DIAGNOSIS — R7301 Impaired fasting glucose: Secondary | ICD-10-CM | POA: Diagnosis not present

## 2024-06-10 ENCOUNTER — Other Ambulatory Visit: Payer: Self-pay | Admitting: Internal Medicine

## 2024-06-10 DIAGNOSIS — I10 Essential (primary) hypertension: Secondary | ICD-10-CM | POA: Diagnosis not present

## 2024-06-10 DIAGNOSIS — Z1389 Encounter for screening for other disorder: Secondary | ICD-10-CM | POA: Diagnosis not present

## 2024-06-10 DIAGNOSIS — N1831 Chronic kidney disease, stage 3a: Secondary | ICD-10-CM | POA: Diagnosis not present

## 2024-06-10 DIAGNOSIS — M545 Low back pain, unspecified: Secondary | ICD-10-CM | POA: Diagnosis not present

## 2024-06-10 DIAGNOSIS — I7102 Dissection of abdominal aorta: Secondary | ICD-10-CM | POA: Diagnosis not present

## 2024-06-10 DIAGNOSIS — I25118 Atherosclerotic heart disease of native coronary artery with other forms of angina pectoris: Secondary | ICD-10-CM | POA: Diagnosis not present

## 2024-06-10 DIAGNOSIS — J438 Other emphysema: Secondary | ICD-10-CM | POA: Diagnosis not present

## 2024-06-10 DIAGNOSIS — R7989 Other specified abnormal findings of blood chemistry: Secondary | ICD-10-CM

## 2024-06-10 DIAGNOSIS — I739 Peripheral vascular disease, unspecified: Secondary | ICD-10-CM | POA: Diagnosis not present

## 2024-06-10 DIAGNOSIS — Z0000 Encounter for general adult medical examination without abnormal findings: Secondary | ICD-10-CM | POA: Diagnosis not present

## 2024-06-10 DIAGNOSIS — Z1331 Encounter for screening for depression: Secondary | ICD-10-CM | POA: Diagnosis not present

## 2024-06-10 DIAGNOSIS — R2681 Unsteadiness on feet: Secondary | ICD-10-CM | POA: Diagnosis not present

## 2024-06-10 DIAGNOSIS — F0390 Unspecified dementia without behavioral disturbance: Secondary | ICD-10-CM | POA: Diagnosis not present

## 2024-06-10 DIAGNOSIS — E441 Mild protein-calorie malnutrition: Secondary | ICD-10-CM | POA: Diagnosis not present

## 2024-06-10 DIAGNOSIS — R82998 Other abnormal findings in urine: Secondary | ICD-10-CM | POA: Diagnosis not present

## 2024-06-10 DIAGNOSIS — M810 Age-related osteoporosis without current pathological fracture: Secondary | ICD-10-CM | POA: Diagnosis not present

## 2024-06-11 ENCOUNTER — Other Ambulatory Visit (HOSPITAL_COMMUNITY): Payer: Self-pay | Admitting: Internal Medicine

## 2024-06-11 DIAGNOSIS — M810 Age-related osteoporosis without current pathological fracture: Secondary | ICD-10-CM | POA: Insufficient documentation

## 2024-06-12 ENCOUNTER — Telehealth (HOSPITAL_COMMUNITY): Payer: Self-pay | Admitting: Pharmacy Technician

## 2024-06-12 NOTE — Telephone Encounter (Signed)
 Erroneous encounter

## 2024-06-18 ENCOUNTER — Ambulatory Visit
Admission: RE | Admit: 2024-06-18 | Discharge: 2024-06-18 | Disposition: A | Discharge status: Home / Self Care | Source: Ambulatory Visit | Attending: Internal Medicine | Admitting: Internal Medicine

## 2024-06-18 DIAGNOSIS — R7989 Other specified abnormal findings of blood chemistry: Secondary | ICD-10-CM

## 2024-06-18 DIAGNOSIS — R945 Abnormal results of liver function studies: Secondary | ICD-10-CM | POA: Diagnosis not present

## 2024-07-15 ENCOUNTER — Inpatient Hospital Stay (HOSPITAL_COMMUNITY): Admission: RE | Admit: 2024-07-15 | Discharge: 2024-07-15 | Attending: Internal Medicine

## 2024-07-15 ENCOUNTER — Inpatient Hospital Stay (HOSPITAL_COMMUNITY): Admission: RE | Admit: 2024-07-15 | Source: Ambulatory Visit

## 2024-07-15 VITALS — BP 140/61 | HR 81 | Temp 97.2°F | Resp 16

## 2024-07-15 DIAGNOSIS — M810 Age-related osteoporosis without current pathological fracture: Secondary | ICD-10-CM | POA: Insufficient documentation

## 2024-07-15 MED ORDER — DENOSUMAB 60 MG/ML ~~LOC~~ SOSY
PREFILLED_SYRINGE | SUBCUTANEOUS | Status: AC
  Filled 2024-07-15: qty 1

## 2024-07-15 MED ORDER — DENOSUMAB 60 MG/ML ~~LOC~~ SOSY
60.0000 mg | PREFILLED_SYRINGE | Freq: Once | SUBCUTANEOUS | Status: AC
  Administered 2024-07-15: 60 mg via SUBCUTANEOUS

## 2024-09-08 ENCOUNTER — Other Ambulatory Visit: Payer: Self-pay | Admitting: Neurology

## 2024-09-23 ENCOUNTER — Ambulatory Visit: Admitting: Neurology

## 2025-01-14 ENCOUNTER — Encounter (HOSPITAL_COMMUNITY)
